# Patient Record
Sex: Female | Born: 1940 | Race: White | Hispanic: No
Health system: Southern US, Community
[De-identification: ages and names within clinical notes are randomized; demographics above are authoritative.]

## PROBLEM LIST (undated history)

## (undated) DIAGNOSIS — J449 Chronic obstructive pulmonary disease, unspecified: Secondary | ICD-10-CM

## (undated) DIAGNOSIS — I10 Essential (primary) hypertension: Secondary | ICD-10-CM

## (undated) DIAGNOSIS — E119 Type 2 diabetes mellitus without complications: Secondary | ICD-10-CM

## (undated) DIAGNOSIS — M1990 Unspecified osteoarthritis, unspecified site: Secondary | ICD-10-CM

---

## 2017-11-18 HISTORY — PX: CATARACT EXTRACTION: SUR2

## 2017-12-02 ENCOUNTER — Emergency Department (HOSPITAL_COMMUNITY): Payer: Medicare (Managed Care)

## 2017-12-02 ENCOUNTER — Inpatient Hospital Stay (HOSPITAL_COMMUNITY)
Admission: EM | Admit: 2017-12-02 | Discharge: 2017-12-06 | DRG: 190 | Disposition: A | Discharge status: Home / Self Care | Payer: Medicare (Managed Care) | Attending: Internal Medicine | Admitting: Internal Medicine

## 2017-12-02 ENCOUNTER — Encounter (HOSPITAL_COMMUNITY): Payer: Self-pay

## 2017-12-02 ENCOUNTER — Other Ambulatory Visit: Payer: Self-pay

## 2017-12-02 DIAGNOSIS — Z7722 Contact with and (suspected) exposure to environmental tobacco smoke (acute) (chronic): Secondary | ICD-10-CM | POA: Diagnosis present

## 2017-12-02 DIAGNOSIS — J441 Chronic obstructive pulmonary disease with (acute) exacerbation: Secondary | ICD-10-CM | POA: Diagnosis not present

## 2017-12-02 DIAGNOSIS — R0902 Hypoxemia: Secondary | ICD-10-CM | POA: Diagnosis not present

## 2017-12-02 DIAGNOSIS — Z833 Family history of diabetes mellitus: Secondary | ICD-10-CM

## 2017-12-02 DIAGNOSIS — Z794 Long term (current) use of insulin: Secondary | ICD-10-CM

## 2017-12-02 DIAGNOSIS — L8990 Pressure ulcer of unspecified site, unspecified stage: Secondary | ICD-10-CM | POA: Diagnosis present

## 2017-12-02 DIAGNOSIS — Z8249 Family history of ischemic heart disease and other diseases of the circulatory system: Secondary | ICD-10-CM

## 2017-12-02 DIAGNOSIS — R000 Tachycardia, unspecified: Secondary | ICD-10-CM | POA: Diagnosis present

## 2017-12-02 DIAGNOSIS — B342 Coronavirus infection, unspecified: Secondary | ICD-10-CM | POA: Diagnosis present

## 2017-12-02 DIAGNOSIS — J9601 Acute respiratory failure with hypoxia: Secondary | ICD-10-CM | POA: Diagnosis present

## 2017-12-02 DIAGNOSIS — N179 Acute kidney failure, unspecified: Secondary | ICD-10-CM | POA: Diagnosis present

## 2017-12-02 DIAGNOSIS — E876 Hypokalemia: Secondary | ICD-10-CM | POA: Diagnosis present

## 2017-12-02 DIAGNOSIS — I10 Essential (primary) hypertension: Secondary | ICD-10-CM | POA: Diagnosis present

## 2017-12-02 DIAGNOSIS — E1110 Type 2 diabetes mellitus with ketoacidosis without coma: Secondary | ICD-10-CM | POA: Diagnosis present

## 2017-12-02 DIAGNOSIS — R319 Hematuria, unspecified: Secondary | ICD-10-CM | POA: Diagnosis present

## 2017-12-02 DIAGNOSIS — E860 Dehydration: Secondary | ICD-10-CM | POA: Diagnosis present

## 2017-12-02 DIAGNOSIS — I4581 Long QT syndrome: Secondary | ICD-10-CM | POA: Diagnosis present

## 2017-12-02 DIAGNOSIS — Z888 Allergy status to other drugs, medicaments and biological substances status: Secondary | ICD-10-CM

## 2017-12-02 DIAGNOSIS — Z79899 Other long term (current) drug therapy: Secondary | ICD-10-CM

## 2017-12-02 DIAGNOSIS — L988 Other specified disorders of the skin and subcutaneous tissue: Secondary | ICD-10-CM | POA: Diagnosis present

## 2017-12-02 DIAGNOSIS — J101 Influenza due to other identified influenza virus with other respiratory manifestations: Secondary | ICD-10-CM | POA: Diagnosis present

## 2017-12-02 DIAGNOSIS — Z803 Family history of malignant neoplasm of breast: Secondary | ICD-10-CM

## 2017-12-02 DIAGNOSIS — M1990 Unspecified osteoarthritis, unspecified site: Secondary | ICD-10-CM | POA: Diagnosis present

## 2017-12-02 HISTORY — DX: Unspecified osteoarthritis, unspecified site: M19.90

## 2017-12-02 HISTORY — DX: Essential (primary) hypertension: I10

## 2017-12-02 HISTORY — DX: Chronic obstructive pulmonary disease, unspecified: J44.9

## 2017-12-02 HISTORY — DX: Type 2 diabetes mellitus without complications: E11.9

## 2017-12-02 LAB — COMPREHENSIVE METABOLIC PANEL
ALK PHOS: 59 U/L (ref 38–126)
ALT: 15 U/L (ref 14–54)
AST: 22 U/L (ref 15–41)
Albumin: 3.5 g/dL (ref 3.5–5.0)
Anion gap: 16 — ABNORMAL HIGH (ref 5–15)
BILIRUBIN TOTAL: 0.7 mg/dL (ref 0.3–1.2)
BUN: 9 mg/dL (ref 6–20)
CHLORIDE: 96 mmol/L — AB (ref 101–111)
CO2: 23 mmol/L (ref 22–32)
CREATININE: 1.13 mg/dL — AB (ref 0.44–1.00)
Calcium: 8.8 mg/dL — ABNORMAL LOW (ref 8.9–10.3)
GFR, EST AFRICAN AMERICAN: 54 mL/min — AB (ref >60)
GFR, EST NON AFRICAN AMERICAN: 47 mL/min — AB (ref >60)
Glucose, Bld: 410 mg/dL — ABNORMAL HIGH (ref 65–99)
Potassium: 3 mmol/L — ABNORMAL LOW (ref 3.5–5.1)
Sodium: 135 mmol/L (ref 135–145)
Total Protein: 6.9 g/dL (ref 6.5–8.1)

## 2017-12-02 LAB — CBC WITH DIFFERENTIAL/PLATELET
Basophils Absolute: 0.1 10*3/uL (ref 0.0–0.1)
Basophils Relative: 1 %
EOS ABS: 0.2 10*3/uL (ref 0.0–0.7)
Eosinophils Relative: 3 %
HEMATOCRIT: 39.1 % (ref 36.0–46.0)
HEMOGLOBIN: 13.2 g/dL (ref 12.0–15.0)
LYMPHS ABS: 3.1 10*3/uL (ref 0.7–4.0)
LYMPHS PCT: 47 %
MCH: 29.6 pg (ref 26.0–34.0)
MCHC: 33.8 g/dL (ref 30.0–36.0)
MCV: 87.7 fL (ref 78.0–100.0)
MONOS PCT: 6 %
Monocytes Absolute: 0.4 10*3/uL (ref 0.1–1.0)
NEUTROS ABS: 2.8 10*3/uL (ref 1.7–7.7)
NEUTROS PCT: 43 %
Platelets: 156 10*3/uL (ref 150–400)
RBC: 4.46 MIL/uL (ref 3.87–5.11)
RDW: 13.3 % (ref 11.5–15.5)
WBC: 6.7 10*3/uL (ref 4.0–10.5)

## 2017-12-02 LAB — URINALYSIS, COMPLETE (UACMP) WITH MICROSCOPIC
Bilirubin Urine: NEGATIVE
Glucose, UA: >=500 mg/dL — AB
Hgb urine dipstick: NEGATIVE
Ketones, ur: 5 mg/dL — AB
Leukocytes, UA: NEGATIVE
Nitrite: NEGATIVE
Specific Gravity, Urine: 1.031 — ABNORMAL HIGH (ref 1.005–1.030)
pH: 5 (ref 5.0–8.0)

## 2017-12-02 LAB — BASIC METABOLIC PANEL
ANION GAP: 12 (ref 5–15)
BUN: 16 mg/dL (ref 6–20)
CO2: 23 mmol/L (ref 22–32)
Calcium: 8.4 mg/dL — ABNORMAL LOW (ref 8.9–10.3)
Chloride: 95 mmol/L — ABNORMAL LOW (ref 101–111)
Creatinine, Ser: 1.58 mg/dL — ABNORMAL HIGH (ref 0.44–1.00)
GFR calc Af Amer: 36 mL/min — ABNORMAL LOW (ref >60)
GFR, EST NON AFRICAN AMERICAN: 31 mL/min — AB (ref >60)
GLUCOSE: 746 mg/dL — AB (ref 65–99)
POTASSIUM: 4.7 mmol/L (ref 3.5–5.1)
Sodium: 130 mmol/L — ABNORMAL LOW (ref 135–145)

## 2017-12-02 LAB — TROPONIN I

## 2017-12-02 LAB — CBG MONITORING, ED: Glucose-Capillary: >600 mg/dL (ref 65–99)

## 2017-12-02 LAB — GLUCOSE, CAPILLARY

## 2017-12-02 LAB — BRAIN NATRIURETIC PEPTIDE: B Natriuretic Peptide: 30 pg/mL (ref 0.0–100.0)

## 2017-12-02 MED ORDER — ENOXAPARIN SODIUM 40 MG/0.4ML ~~LOC~~ SOLN
40.0000 mg | SUBCUTANEOUS | Status: DC
  Administered 2017-12-02 – 2017-12-05 (×4): 40 mg via SUBCUTANEOUS
  Filled 2017-12-02 (×4): qty 0.4

## 2017-12-02 MED ORDER — POLYMYXIN B-TRIMETHOPRIM 10000-0.1 UNIT/ML-% OP SOLN
1.0000 [drp] | Freq: Four times a day (QID) | OPHTHALMIC | Status: DC
  Administered 2017-12-02: 1 [drp] via OPHTHALMIC
  Filled 2017-12-02: qty 10

## 2017-12-02 MED ORDER — PREDNISOLONE ACETATE 1 % OP SUSP
1.0000 [drp] | Freq: Four times a day (QID) | OPHTHALMIC | Status: DC
  Administered 2017-12-02: 1 [drp] via OPHTHALMIC
  Filled 2017-12-02: qty 1

## 2017-12-02 MED ORDER — OSELTAMIVIR PHOSPHATE 75 MG PO CAPS: 75.0000 mg | ORAL_CAPSULE | Freq: Two times a day (BID) | ORAL | Status: DC

## 2017-12-02 MED ORDER — TRAZODONE HCL 50 MG PO TABS
50.0000 mg | ORAL_TABLET | Freq: Every evening | ORAL | Status: DC | PRN
  Administered 2017-12-03: 50 mg via ORAL
  Filled 2017-12-02: qty 1

## 2017-12-02 MED ORDER — INSULIN ASPART 100 UNIT/ML ~~LOC~~ SOLN
0.0000 [IU] | Freq: Every day | SUBCUTANEOUS | Status: DC
  Administered 2017-12-03 – 2017-12-05 (×3): 4 [IU] via SUBCUTANEOUS

## 2017-12-02 MED ORDER — SODIUM CHLORIDE 0.9 % IV SOLN
INTRAVENOUS | Status: DC
  Filled 2017-12-02 (×2): qty 1000

## 2017-12-02 MED ORDER — PREDNISONE 20 MG PO TABS
40.0000 mg | ORAL_TABLET | Freq: Every day | ORAL | Status: DC
  Administered 2017-12-03 – 2017-12-06 (×4): 40 mg via ORAL
  Filled 2017-12-02 (×4): qty 2

## 2017-12-02 MED ORDER — INSULIN ASPART 100 UNIT/ML ~~LOC~~ SOLN
10.0000 [IU] | Freq: Once | SUBCUTANEOUS | Status: AC
  Administered 2017-12-02: 10 [IU] via SUBCUTANEOUS

## 2017-12-02 MED ORDER — ACETAMINOPHEN 325 MG PO TABS
650.0000 mg | ORAL_TABLET | Freq: Four times a day (QID) | ORAL | Status: DC | PRN
  Administered 2017-12-03: 650 mg via ORAL
  Filled 2017-12-02: qty 2

## 2017-12-02 MED ORDER — INSULIN ASPART 100 UNIT/ML ~~LOC~~ SOLN: 0.0000 [IU] | Freq: Every day | SUBCUTANEOUS | Status: DC

## 2017-12-02 MED ORDER — MELATONIN 10 MG PO TABS: 10.0000 mg | ORAL_TABLET | Freq: Every day | ORAL | Status: DC

## 2017-12-02 MED ORDER — ACETAMINOPHEN 650 MG RE SUPP: 650.0000 mg | Freq: Four times a day (QID) | RECTAL | Status: DC | PRN

## 2017-12-02 MED ORDER — ONDANSETRON HCL 4 MG/2ML IJ SOLN: 4.0000 mg | Freq: Four times a day (QID) | INTRAMUSCULAR | Status: DC | PRN

## 2017-12-02 MED ORDER — SODIUM CHLORIDE 0.9 % IV BOLUS (SEPSIS)
1000.0000 mL | Freq: Once | INTRAVENOUS | Status: AC
  Administered 2017-12-02: 1000 mL via INTRAVENOUS

## 2017-12-02 MED ORDER — INSULIN NPH (HUMAN) (ISOPHANE) 100 UNIT/ML ~~LOC~~ SUSP
20.0000 [IU] | Freq: Two times a day (BID) | SUBCUTANEOUS | Status: DC
  Administered 2017-12-02 – 2017-12-03 (×2): 20 [IU] via SUBCUTANEOUS
  Filled 2017-12-02 (×2): qty 10

## 2017-12-02 MED ORDER — SODIUM CHLORIDE 0.9 % IV BOLUS (SEPSIS): 1000.0000 mL | Freq: Once | INTRAVENOUS | Status: DC

## 2017-12-02 MED ORDER — POTASSIUM CHLORIDE 10 MEQ/100ML IV SOLN
10.0000 meq | INTRAVENOUS | Status: DC
  Administered 2017-12-02: 10 meq via INTRAVENOUS
  Filled 2017-12-02: qty 100

## 2017-12-02 MED ORDER — IPRATROPIUM-ALBUTEROL 0.5-2.5 (3) MG/3ML IN SOLN
3.0000 mL | Freq: Four times a day (QID) | RESPIRATORY_TRACT | Status: DC | PRN
  Administered 2017-12-03 (×2): 3 mL via RESPIRATORY_TRACT
  Filled 2017-12-02 (×2): qty 3

## 2017-12-02 MED ORDER — POTASSIUM CHLORIDE CRYS ER 20 MEQ PO TBCR: 40.0000 meq | EXTENDED_RELEASE_TABLET | Freq: Two times a day (BID) | ORAL | Status: DC

## 2017-12-02 MED ORDER — INSULIN ASPART 100 UNIT/ML ~~LOC~~ SOLN: 0.0000 [IU] | Freq: Three times a day (TID) | SUBCUTANEOUS | Status: DC

## 2017-12-02 MED ORDER — GABAPENTIN 600 MG PO TABS
600.0000 mg | ORAL_TABLET | Freq: Three times a day (TID) | ORAL | Status: DC
  Administered 2017-12-02 – 2017-12-06 (×12): 600 mg via ORAL
  Filled 2017-12-02 (×12): qty 1

## 2017-12-02 MED ORDER — SODIUM CHLORIDE 0.9 % IV SOLN
500.0000 mg | Freq: Once | INTRAVENOUS | Status: AC
  Administered 2017-12-02: 500 mg via INTRAVENOUS
  Filled 2017-12-02: qty 500

## 2017-12-02 MED ORDER — SODIUM CHLORIDE 0.9 % IV BOLUS (SEPSIS): 500.0000 mL | Freq: Once | INTRAVENOUS | Status: DC

## 2017-12-02 MED ORDER — ONDANSETRON HCL 4 MG PO TABS
4.0000 mg | ORAL_TABLET | Freq: Four times a day (QID) | ORAL | Status: DC | PRN
  Administered 2017-12-06: 4 mg via ORAL
  Filled 2017-12-02: qty 1

## 2017-12-02 MED ORDER — OSELTAMIVIR PHOSPHATE 30 MG PO CAPS
30.0000 mg | ORAL_CAPSULE | Freq: Two times a day (BID) | ORAL | Status: DC
  Administered 2017-12-02 – 2017-12-06 (×8): 30 mg via ORAL
  Filled 2017-12-02 (×10): qty 1

## 2017-12-02 MED ORDER — SENNOSIDES-DOCUSATE SODIUM 8.6-50 MG PO TABS
1.0000 | ORAL_TABLET | Freq: Every evening | ORAL | Status: DC | PRN
  Administered 2017-12-03: 1 via ORAL
  Filled 2017-12-02: qty 1

## 2017-12-02 MED ORDER — ROSUVASTATIN CALCIUM 10 MG PO TABS
20.0000 mg | ORAL_TABLET | Freq: Every day | ORAL | Status: DC
  Administered 2017-12-02 – 2017-12-05 (×4): 20 mg via ORAL
  Filled 2017-12-02 (×4): qty 2

## 2017-12-02 MED ORDER — DULOXETINE HCL 60 MG PO CPEP
60.0000 mg | ORAL_CAPSULE | Freq: Every day | ORAL | Status: DC
  Administered 2017-12-03 – 2017-12-06 (×4): 60 mg via ORAL
  Filled 2017-12-02 (×4): qty 1

## 2017-12-02 MED ORDER — MELATONIN 3 MG PO TABS
9.0000 mg | ORAL_TABLET | Freq: Every day | ORAL | Status: DC
  Administered 2017-12-02 – 2017-12-05 (×4): 9 mg via ORAL
  Filled 2017-12-02 (×5): qty 3

## 2017-12-02 MED ORDER — KETOROLAC TROMETHAMINE 0.5 % OP SOLN
1.0000 [drp] | Freq: Four times a day (QID) | OPHTHALMIC | Status: DC
  Administered 2017-12-02: 1 [drp] via OPHTHALMIC
  Filled 2017-12-02: qty 3

## 2017-12-02 MED ORDER — INSULIN ASPART 100 UNIT/ML ~~LOC~~ SOLN
0.0000 [IU] | Freq: Three times a day (TID) | SUBCUTANEOUS | Status: DC
  Administered 2017-12-03: 5 [IU] via SUBCUTANEOUS
  Administered 2017-12-03 – 2017-12-04 (×3): 11 [IU] via SUBCUTANEOUS
  Administered 2017-12-04: 3 [IU] via SUBCUTANEOUS
  Administered 2017-12-04: 8 [IU] via SUBCUTANEOUS
  Administered 2017-12-05: 11 [IU] via SUBCUTANEOUS
  Administered 2017-12-05: 5 [IU] via SUBCUTANEOUS
  Administered 2017-12-05: 11 [IU] via SUBCUTANEOUS
  Administered 2017-12-06: 2 [IU] via SUBCUTANEOUS
  Administered 2017-12-06: 11 [IU] via SUBCUTANEOUS
  Administered 2017-12-06: 15 [IU] via SUBCUTANEOUS

## 2017-12-02 MED ORDER — SODIUM CHLORIDE 0.9 % IV SOLN: INTRAVENOUS | Status: DC

## 2017-12-02 MED ORDER — IPRATROPIUM-ALBUTEROL 0.5-2.5 (3) MG/3ML IN SOLN
3.0000 mL | Freq: Once | RESPIRATORY_TRACT | Status: AC
  Administered 2017-12-02: 3 mL via RESPIRATORY_TRACT
  Filled 2017-12-02: qty 3

## 2017-12-02 NOTE — ED Notes (Signed)
Pharmacist called and advised he would send the Novolin to the floor because they have to charge the patient for the entire bottle.

## 2017-12-02 NOTE — ED Notes (Signed)
Contacted admitting doc in regards to elevated sugar. Dr. Mikey BussingHoffman advised to give 20units of Novolin and then give sliding scale insulin as indicated.

## 2017-12-02 NOTE — Plan of Care (Signed)
Progressing
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
## 2017-12-02 NOTE — ED Triage Notes (Signed)
[facility] and was dx. With COPD and Flu today. Pt reports she developed SOB and cough last night with green sputum. Also reports that [place] states that she has not had home O2 since. Pt O2 at 86% RA. A&O.
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
## 2017-12-03 ENCOUNTER — Encounter (HOSPITAL_COMMUNITY): Payer: Self-pay

## 2017-12-03 DIAGNOSIS — J101 Influenza due to other identified influenza virus with other respiratory manifestations: Secondary | ICD-10-CM

## 2017-12-03 DIAGNOSIS — E876 Hypokalemia: Secondary | ICD-10-CM | POA: Diagnosis not present

## 2017-12-03 DIAGNOSIS — I10 Essential (primary) hypertension: Secondary | ICD-10-CM

## 2017-12-03 DIAGNOSIS — E1110 Type 2 diabetes mellitus with ketoacidosis without coma: Secondary | ICD-10-CM | POA: Diagnosis present

## 2017-12-03 DIAGNOSIS — Z794 Long term (current) use of insulin: Secondary | ICD-10-CM | POA: Diagnosis not present

## 2017-12-03 DIAGNOSIS — J9601 Acute respiratory failure with hypoxia: Secondary | ICD-10-CM

## 2017-12-03 DIAGNOSIS — B9729 Other coronavirus as the cause of diseases classified elsewhere: Secondary | ICD-10-CM

## 2017-12-03 DIAGNOSIS — M1990 Unspecified osteoarthritis, unspecified site: Secondary | ICD-10-CM | POA: Diagnosis not present

## 2017-12-03 DIAGNOSIS — Z79899 Other long term (current) drug therapy: Secondary | ICD-10-CM

## 2017-12-03 DIAGNOSIS — J441 Chronic obstructive pulmonary disease with (acute) exacerbation: Principal | ICD-10-CM

## 2017-12-03 LAB — RESPIRATORY PANEL BY PCR
ADENOVIRUS-RVPPCR: NOT DETECTED
BORDETELLA PERTUSSIS-RVPCR: NOT DETECTED
CHLAMYDOPHILA PNEUMONIAE-RVPPCR: NOT DETECTED
CORONAVIRUS 229E-RVPPCR: NOT DETECTED
CORONAVIRUS HKU1-RVPPCR: NOT DETECTED
Coronavirus NL63: NOT DETECTED
Coronavirus OC43: DETECTED — AB
Influenza A: NOT DETECTED
Influenza B: NOT DETECTED
METAPNEUMOVIRUS-RVPPCR: NOT DETECTED
Mycoplasma pneumoniae: NOT DETECTED
PARAINFLUENZA VIRUS 2-RVPPCR: NOT DETECTED
Parainfluenza Virus 1: NOT DETECTED
Parainfluenza Virus 3: NOT DETECTED
Parainfluenza Virus 4: NOT DETECTED
Respiratory Syncytial Virus: NOT DETECTED
Rhinovirus / Enterovirus: NOT DETECTED

## 2017-12-03 LAB — BASIC METABOLIC PANEL
Anion gap: 9 (ref 5–15)
BUN: 17 mg/dL (ref 6–20)
CALCIUM: 8.1 mg/dL — AB (ref 8.9–10.3)
CO2: 24 mmol/L (ref 22–32)
CREATININE: 1.07 mg/dL — AB (ref 0.44–1.00)
Chloride: 101 mmol/L (ref 101–111)
GFR calc Af Amer: 58 mL/min — ABNORMAL LOW (ref >60)
GFR calc non Af Amer: 50 mL/min — ABNORMAL LOW (ref >60)
GLUCOSE: 380 mg/dL — AB (ref 65–99)
Potassium: 3.5 mmol/L (ref 3.5–5.1)
Sodium: 134 mmol/L — ABNORMAL LOW (ref 135–145)

## 2017-12-03 LAB — COMPREHENSIVE METABOLIC PANEL
ALK PHOS: 55 U/L (ref 38–126)
ALT: 15 U/L (ref 14–54)
AST: 16 U/L (ref 15–41)
Albumin: 3 g/dL — ABNORMAL LOW (ref 3.5–5.0)
Anion gap: 10 (ref 5–15)
BILIRUBIN TOTAL: 0.4 mg/dL (ref 0.3–1.2)
BUN: 19 mg/dL (ref 6–20)
CALCIUM: 8.3 mg/dL — AB (ref 8.9–10.3)
CHLORIDE: 98 mmol/L — AB (ref 101–111)
CO2: 23 mmol/L (ref 22–32)
CREATININE: 1.34 mg/dL — AB (ref 0.44–1.00)
GFR calc Af Amer: 44 mL/min — ABNORMAL LOW (ref >60)
GFR, EST NON AFRICAN AMERICAN: 38 mL/min — AB (ref >60)
Glucose, Bld: 572 mg/dL (ref 65–99)
Potassium: 3.9 mmol/L (ref 3.5–5.1)
Sodium: 131 mmol/L — ABNORMAL LOW (ref 135–145)
TOTAL PROTEIN: 6.3 g/dL — AB (ref 6.5–8.1)

## 2017-12-03 LAB — CBC
HCT: 37.1 % (ref 36.0–46.0)
Hemoglobin: 12.5 g/dL (ref 12.0–15.0)
MCH: 29.3 pg (ref 26.0–34.0)
MCHC: 33.7 g/dL (ref 30.0–36.0)
MCV: 87.1 fL (ref 78.0–100.0)
PLATELETS: 148 10*3/uL — AB (ref 150–400)
RBC: 4.26 MIL/uL (ref 3.87–5.11)
RDW: 12.8 % (ref 11.5–15.5)
WBC: 4.6 10*3/uL (ref 4.0–10.5)

## 2017-12-03 LAB — GLUCOSE, CAPILLARY
Glucose-Capillary: 336 mg/dL — ABNORMAL HIGH (ref 65–99)
Glucose-Capillary: 284 mg/dL — ABNORMAL HIGH (ref 65–99)
Glucose-Capillary: 264 mg/dL — ABNORMAL HIGH (ref 65–99)
Glucose-Capillary: 336 mg/dL — ABNORMAL HIGH (ref 65–99)
Glucose-Capillary: 336 mg/dL — ABNORMAL HIGH (ref 65–99)

## 2017-12-03 MED ORDER — POLYMYXIN B-TRIMETHOPRIM 10000-0.1 UNIT/ML-% OP SOLN
1.0000 [drp] | Freq: Four times a day (QID) | OPHTHALMIC | Status: DC
  Administered 2017-12-03 – 2017-12-06 (×14): 1 [drp] via OPHTHALMIC
  Filled 2017-12-03: qty 10

## 2017-12-03 MED ORDER — INSULIN GLARGINE 100 UNIT/ML ~~LOC~~ SOLN
20.0000 [IU] | Freq: Every day | SUBCUTANEOUS | Status: DC
  Administered 2017-12-03 – 2017-12-04 (×2): 20 [IU] via SUBCUTANEOUS
  Filled 2017-12-03 (×2): qty 0.2

## 2017-12-03 MED ORDER — PREDNISOLONE ACETATE 1 % OP SUSP
1.0000 [drp] | Freq: Four times a day (QID) | OPHTHALMIC | Status: DC
  Administered 2017-12-03 – 2017-12-06 (×14): 1 [drp] via OPHTHALMIC
  Filled 2017-12-03: qty 1

## 2017-12-03 MED ORDER — INSULIN ASPART 100 UNIT/ML ~~LOC~~ SOLN
10.0000 [IU] | Freq: Once | SUBCUTANEOUS | Status: AC
  Administered 2017-12-03: 10 [IU] via SUBCUTANEOUS

## 2017-12-03 MED ORDER — SODIUM CHLORIDE 0.9 % IV BOLUS (SEPSIS)
1000.0000 mL | Freq: Once | INTRAVENOUS | Status: AC
  Administered 2017-12-03: 1000 mL via INTRAVENOUS

## 2017-12-03 MED ORDER — POTASSIUM CHLORIDE CRYS ER 20 MEQ PO TBCR
30.0000 meq | EXTENDED_RELEASE_TABLET | Freq: Once | ORAL | Status: AC
  Administered 2017-12-03: 30 meq via ORAL
  Filled 2017-12-03: qty 1

## 2017-12-03 MED ORDER — IPRATROPIUM-ALBUTEROL 0.5-2.5 (3) MG/3ML IN SOLN
3.0000 mL | Freq: Three times a day (TID) | RESPIRATORY_TRACT | Status: DC
  Administered 2017-12-03: 3 mL via RESPIRATORY_TRACT
  Filled 2017-12-03 (×2): qty 3

## 2017-12-03 MED ORDER — SODIUM CHLORIDE 0.9 % IV SOLN
INTRAVENOUS | Status: AC
  Administered 2017-12-03 (×2): via INTRAVENOUS

## 2017-12-03 MED ORDER — IPRATROPIUM-ALBUTEROL 0.5-2.5 (3) MG/3ML IN SOLN
3.0000 mL | RESPIRATORY_TRACT | Status: DC | PRN
  Administered 2017-12-03 – 2017-12-04 (×2): 3 mL via RESPIRATORY_TRACT
  Filled 2017-12-03: qty 3

## 2017-12-03 MED ORDER — KETOROLAC TROMETHAMINE 0.5 % OP SOLN
1.0000 [drp] | Freq: Four times a day (QID) | OPHTHALMIC | Status: DC
  Administered 2017-12-03 – 2017-12-06 (×14): 1 [drp] via OPHTHALMIC
  Filled 2017-12-03 (×2): qty 3

## 2017-12-03 MED ORDER — ACETAMINOPHEN 325 MG PO TABS
650.0000 mg | ORAL_TABLET | Freq: Three times a day (TID) | ORAL | Status: DC
  Administered 2017-12-03 – 2017-12-06 (×9): 650 mg via ORAL
  Filled 2017-12-03 (×9): qty 2

## 2017-12-03 MED ORDER — IPRATROPIUM-ALBUTEROL 0.5-2.5 (3) MG/3ML IN SOLN
3.0000 mL | Freq: Four times a day (QID) | RESPIRATORY_TRACT | Status: DC
  Administered 2017-12-04 (×2): 3 mL via RESPIRATORY_TRACT
  Filled 2017-12-03 (×2): qty 3

## 2017-12-03 MED ORDER — GUAIFENESIN-DM 100-10 MG/5ML PO SYRP
5.0000 mL | ORAL_SOLUTION | ORAL | Status: DC | PRN
  Administered 2017-12-03: 5 mL via ORAL
  Filled 2017-12-03: qty 5

## 2017-12-03 NOTE — Evaluation (Signed)
Physical Therapy Evaluation Patient Details Name: Gabriella [name] MRN: [id number] DOB: 12-15-1940 Today's Date: 12/03/2017   History of Present Illness  Patient [facility] with dyspnea. At baseline she was not using O2. She is on 2L today. PMH: HTN, CHF, Arthritis; DMII   Clinical Impression  Patient presents with decreased endurance compared to baseline. Her Sao2 dropped to 90% with 6 feet of ambulation. She became short of breath with limited distance. At this time she would benefit from a reurn to the pace program but may need to improve her endurance and mobility. Skilled therapy will continue to work with the patient.     Follow Up Recommendations SNF;Other (comment)(Back to [facility] V SNF)    Equipment Recommendations       Recommendations for Other Services       Precautions / Restrictions Precautions Precautions: Fall Restrictions Weight Bearing Restrictions: No      Mobility  Bed Mobility Overal bed mobility: Needs Assistance Bed Mobility: Supine to Sit;Sit to Supine     Supine to sit: Min assist     General bed mobility comments: reequired assit to the edge of the bed. Once to the edge of the bed able to maintain sitting psoition Sao2 95%   Transfers Overall transfer level: Needs assistance Equipment used: Rolling walker (2 wheeled) Transfers: Sit to/from Stand Sit to Stand: Min guard         General transfer comment: guarding for balance Sao2 94% on 2l   Ambulation/Gait Ambulation/Gait assistance: Min guard Ambulation Distance (Feet): 6 Feet Assistive device: Rolling walker (2 wheeled) Gait Pattern/deviations: Decreased stride length     General Gait Details: Patient became syncopal after 3 steps. Sao2 down to 90 %. Improved syncope with 30 sec seated rest break. patient on 2L of Sao2  Stairs            Wheelchair Mobility    Modified Rankin (Stroke Patients Only)       Balance Overall balance  assessment: Needs assistance Sitting-balance support: No upper extremity supported Sitting balance-Leahy Scale: Good     Standing balance support: Bilateral upper extremity supported Standing balance-Leahy Scale: Poor                               Pertinent Vitals/Pain      Home Living Family/patient expects to be discharged to:: Private residence Living Arrangements: Children Available Help at Discharge: Other (Comment)(goes to [facility] progr[facility] during the day )                  Prior Function Level of Independence: Independent with assistive device(s)         Comments: used rollator      Hand Dominance   Dominant Hand: Right    Extremity/Trunk Assessment   Upper Extremity Assessment Upper Extremity Assessment: Overall WFL for tasks assessed    Lower Extremity Assessment Lower Extremity Assessment: Overall WFL for tasks assessed       Communication   Communication: No difficulties  Cognition Arousal/Alertness: Awake/alert Behavior During Therapy: WFL for tasks assessed/performed Overall Cognitive Status: Within Functional Limits for tasks assessed                                        General Comments General comments (skin integrity, edema, etc.): 2L of  NCo2     Exercises     Assessment/Plan    PT Assessment Patient needs continued PT services  PT Problem List Decreased strength;Decreased activity tolerance;Decreased mobility;Decreased coordination       PT Treatment Interventions Gait training;Stair training;Therapeutic activities;Functional mobility training;Therapeutic exercise;Patient/family education;DME instruction    PT Goals (Current goals can be found in the Care Plan section)  Acute Rehab PT Goals Patient Stated Goal: to breath better with activity  PT Goal Formulation: With patient Time For Goal Achievement: 12/10/17 Potential to Achieve Goals: Good    Frequency Min 4X/week   Barriers to discharge         Co-evaluation               AM-PAC PT "6 Clicks" Daily Activity  Outcome Measure Difficulty turning over in bed (including adjusting bedclothes, sheets and blankets)?: A Little Difficulty moving from lying on back to sitting on the side of the bed? : A Little Difficulty sitting down on and standing up from a chair with arms (e.g., wheelchair, bedside commode, etc,.)?: A Little Help needed moving to and from a bed to chair (including a wheelchair)?: A Little Help needed walking in hospital room?: A Lot Help needed climbing 3-5 steps with a railing? : A Lot 6 Click Score: 16    End of Session Equipment Utilized During Treatment: Gait belt Activity Tolerance: Patient limited by fatigue Patient left: in bed;with bed alarm set;with nursing/sitter in room   PT Visit Diagnosis: Unsteadiness on feet (R26.81);Other abnormalities of gait and mobility (R26.89);Repeated falls (R29.6);Muscle weakness (generalized) (M62.81)    Time: 0981-1914 PT Time Calculation (min) (ACUTE ONLY): 23 min   Charges:   PT Evaluation $PT Eval Moderate Complexity: 1 Mod     PT G Codes:          Dessie Coma PT DPT  12/03/2017, 3:11 PM

## 2017-12-03 NOTE — Progress Notes (Signed)
Critical glucose (746). Dr. Mikey BussingHoffman at bedside and notified. Labs were drawn prior to administration of insulin and bolus. Pt without distress. Will monitor.

## 2017-12-03 NOTE — Progress Notes (Addendum)
Patient was admitted today for COPD exacerbation and received solumedrol prior to arrival to the hospital.  Blood sugars were 410 on admission.  Patient received novolin 20 units (home dose of 25 units) with no improvement in CBGs. Went to evaluate patient for concerning Blood sugars >600.  Patient was alert and oriented x 3 and states she was breathing comfortably.  She was satting 90% on 3L .  A stat BMET showed glucose: 746, bicarb 23, potassium 4.7, normal anion gap.  1L bolus was started and 10 units of novolog given.  No history of CHF.  If cbgs still elevated will need to continue with fluid bolus and administer insulin as needed.  Not concerned for DKA at this time.  Will need close cbg monitoring.   Addendum 3:45am Bmet with glucose of 570s, normal anion gap, K of 3.9 and bicarb of 23.  Will bolus 1L NS over 2 hours and give 10 units of novolog.  She will remain on maintenance fluids after the bolus.

## 2017-12-03 NOTE — Progress Notes (Signed)
Gabriella [name] 77 y o f patient received from ED. Patient is alert and oriented x 3. Vital signs are stable. Skin assessment done with another nurse. Patient given instruction about call bell, phone and unit routine. Bed in low position, side rail up x2 and call bell in reach.

## 2017-12-03 NOTE — Progress Notes (Signed)
Date: 12/03/2017  Patient name: Gabriella [name]  Medical record number: [id number]  Date of birth: 01-Jul-1941   I have seen and evaluated Gabriella [name] and discussed their care with the Residency Team.  In brief, patient is a [age] year old female with a past medical history of COPD, insulin-dependent type 2 diabetes, hypertension and arthritis who presented to the ED with worsening shortness of breath over the last day.  Patient states that she had progressive shortness of breath at home associated with nausea and the day prior to admission and went to [facility] her [facility] care [facility] and test positive for the flu and was sent to the emergency room after receiving a dose of Solu-Medrol.  Patient complained of associated rhinorrhea and productive cough with clear phlegm as well as associated wheezing.  Patient also complained of associated chest pain which worsened with cough and is sharp in nature.  No nausea/vomiting, no abdominal pain, no diarrhea, no lightheadedness, no syncope, no focal weakness, tingling or numbness, no palpitations, no diaphoresis.  Today patient states that she has episodes of intermittent cough as well as associated pleuritic type chest pain on coughing.  PMHx, Fam Hx, and/or Soc Hx : As per resident admit note  Vitals:   12/03/17 0754 12/03/17 0758  BP:    Pulse:    Resp:    Temp:  98.6 F (37 C)  SpO2: 94%    General: Awake alert and oriented x3, NAD CVS: Regular rate and rhythm, normal heart sounds Lungs: Scattered bilateral expiratory wheeze noted Abdomen: Soft, nontender, nondistended, normoactive bowel sounds Extremities: No edema noted Neuro: No focal deficits noted, awake alert and oriented x3, cranial nerves II to XII grossly intact HEENT: Normocephalic, atraumatic, no conjunctival injection noted   Assessment and Plan: I have seen and evaluated the patient as outlined above. I agree with the formulated Assessment and Plan as detailed in  the residents' note, with the following changes:   1.  Acute hypoxic respiratory failure: -Patient initially presented with worsening shortness of breath and was found to have be flu positive at her PCPs office and had an oxygen saturation of 85% on room air noted by EMS.  Patient did not have leukocytosis or fevers and her x-ray did not have any evidence of an infiltrate.  It is likely that the patient has an acute COPD exacerbation secondary to an underlying viral illness.  Patient was noted to be flu positive at her PCPs office but on her respiratory viral panel here she tested positive for corona virus and was negative for the flu. -We will complete 5-day course of Tamiflu  -Continue with duo nebs every 6 hours as needed -Maintain respiratory precautions for now -We will continue with O2 via nasal cannula as needed.  Her oxygen saturations were in the mid to late 90s on 3 L nasal cannula this morning.  We have decreased it to 2 L and will monitor -PT/OT eval pending -We will continue with prednisone 40 mg daily for 5-day course for COPD exacerbation -No further workup at this time -Patient will likely be stable for discharge home tomorrow  2.  Mild DKA: -Patient was noted to have blood sugars up to the 700s after receiving Solu-Medrol at her PCPs office -She received NovoLog insulin and was given IV fluids -Patient was noted to have mildly positive ketones on her urine analysis and a mildly elevated anion gap of 16 on admission which quickly resolved -Blood sugars are much improved today  down to the 200s and her anion gap has been within normal limits over the last 3 BMPs -We will resume her home dose of Lantus 20 units today -Patient will need outpatient follow-up with her PCP -No further workup at this time  Earl Lagos, MD 3/16/201911:31 AM

## 2017-12-03 NOTE — Progress Notes (Signed)
   Subjective: [name] was seen laying down in her bed. She states that she feels like her muscle aches, cough, and sob has worsened today. She mentioned that she would like to continue her treatment in hospital for another day.   Objective:  Vital signs in last 24 hours: Vitals:   12/03/17 0422 12/03/17 0740 12/03/17 0754 12/03/17 0758  BP: 139/71     Pulse: 89     Resp: 20     Temp: 98.2 F (36.8 C) 98.3 F (36.8 C)  98.6 F (37 C)  TempSrc: Oral Oral  Oral  SpO2: 98%  94%   Weight:      Height:       Physical Exam  Constitutional: She appears well-developed and well-nourished. No distress.  HENT:  Head: Normocephalic and atraumatic.  Eyes: Conjunctivae are normal.  Cardiovascular: Normal rate, regular rhythm and normal heart sounds.  Respiratory: Effort normal. No respiratory distress. She has wheezes (scattered wheezing).  GI: Soft. Bowel sounds are normal. She exhibits no distension. There is no tenderness.  Musculoskeletal: She exhibits no edema.  Neurological: She is alert. No cranial nerve deficit.  Skin: She is not diaphoretic. No erythema.  Psychiatric: She has a normal mood and affect. Her behavior is normal. Judgment and thought content normal.   Assessment/Plan:  77 y.o female with history of COPD, diabetes mellitus, essential hypertension, and arthritis presents with shortness of breath and blood in adult pamper. Lab tests significant for random glucose>600, negative troponin and bnp. Chest xray without any evidence of pneumonia or effusion. The patient tested positive for influenza which is likely prompting COPD exacerbation.   Acute respiratory failure  COPD exacerbation secondary to viral illness The patient's acute dyspnea with accompanied productive cough, myalgias, and fatigue are likely due to viral infection causing a copd exacerbation. The patient tested rapid flu positive at pcp's office, however on rvp she only tested positive for coronavirus.    -Tamiflu 75mg  bid for 5 days  -Continue 2L via [place] -Duoneb q6hrs prn -Tamiflu 75mg  bid  Hypokalemia Patient's k=3.5 today.   -Repleted 30meq of kdur once  DKA The patient presented with blood glucose upto 700s, mild anion gap, and ketones in urine on admission. She was started on dka protocol by keeping npo and giving fluids throughout the night. Her last recorded blood glucose reading this morning is 264 with anion gap returned to normal.   -Resume home lantus 20units qhs -CBG monitoring 4 times daily -SSI tid wc  -SSI qhs  Essential Hypertension The patient's blood pressure is reasonably well controlled at 139/71 today.  over The patient takes lisinopril 20mg  qd and amlodipine 10mg  qd.   -Continue lisinopril 20mg  qd   Dispo: Anticipated discharge in approximately 1-2 day(s).   Lorenso Courierhundi, Davinder Haff, MD 12/03/2017, 1:53 PM Pager: [phone]

## 2017-12-04 DIAGNOSIS — I10 Essential (primary) hypertension: Secondary | ICD-10-CM | POA: Diagnosis present

## 2017-12-04 DIAGNOSIS — Z9109 Other allergy status, other than to drugs and biological substances: Secondary | ICD-10-CM | POA: Diagnosis not present

## 2017-12-04 DIAGNOSIS — Z8249 Family history of ischemic heart disease and other diseases of the circulatory system: Secondary | ICD-10-CM | POA: Diagnosis not present

## 2017-12-04 DIAGNOSIS — I4581 Long QT syndrome: Secondary | ICD-10-CM | POA: Diagnosis present

## 2017-12-04 DIAGNOSIS — R319 Hematuria, unspecified: Secondary | ICD-10-CM | POA: Diagnosis present

## 2017-12-04 DIAGNOSIS — E1110 Type 2 diabetes mellitus with ketoacidosis without coma: Secondary | ICD-10-CM | POA: Diagnosis present

## 2017-12-04 DIAGNOSIS — E860 Dehydration: Secondary | ICD-10-CM | POA: Diagnosis present

## 2017-12-04 DIAGNOSIS — R000 Tachycardia, unspecified: Secondary | ICD-10-CM | POA: Diagnosis present

## 2017-12-04 DIAGNOSIS — Z9981 Dependence on supplemental oxygen: Secondary | ICD-10-CM | POA: Diagnosis not present

## 2017-12-04 DIAGNOSIS — Z794 Long term (current) use of insulin: Secondary | ICD-10-CM | POA: Diagnosis not present

## 2017-12-04 DIAGNOSIS — E876 Hypokalemia: Secondary | ICD-10-CM | POA: Diagnosis present

## 2017-12-04 DIAGNOSIS — J101 Influenza due to other identified influenza virus with other respiratory manifestations: Secondary | ICD-10-CM | POA: Diagnosis present

## 2017-12-04 DIAGNOSIS — Z7722 Contact with and (suspected) exposure to environmental tobacco smoke (acute) (chronic): Secondary | ICD-10-CM | POA: Diagnosis present

## 2017-12-04 DIAGNOSIS — M1990 Unspecified osteoarthritis, unspecified site: Secondary | ICD-10-CM | POA: Diagnosis present

## 2017-12-04 DIAGNOSIS — B9729 Other coronavirus as the cause of diseases classified elsewhere: Secondary | ICD-10-CM | POA: Diagnosis not present

## 2017-12-04 DIAGNOSIS — B342 Coronavirus infection, unspecified: Secondary | ICD-10-CM | POA: Diagnosis present

## 2017-12-04 DIAGNOSIS — L988 Other specified disorders of the skin and subcutaneous tissue: Secondary | ICD-10-CM | POA: Diagnosis present

## 2017-12-04 DIAGNOSIS — Z803 Family history of malignant neoplasm of breast: Secondary | ICD-10-CM | POA: Diagnosis not present

## 2017-12-04 DIAGNOSIS — J9601 Acute respiratory failure with hypoxia: Secondary | ICD-10-CM | POA: Diagnosis present

## 2017-12-04 DIAGNOSIS — J441 Chronic obstructive pulmonary disease with (acute) exacerbation: Secondary | ICD-10-CM | POA: Diagnosis present

## 2017-12-04 DIAGNOSIS — Z888 Allergy status to other drugs, medicaments and biological substances status: Secondary | ICD-10-CM | POA: Diagnosis not present

## 2017-12-04 DIAGNOSIS — Z833 Family history of diabetes mellitus: Secondary | ICD-10-CM | POA: Diagnosis not present

## 2017-12-04 DIAGNOSIS — N179 Acute kidney failure, unspecified: Secondary | ICD-10-CM | POA: Diagnosis present

## 2017-12-04 DIAGNOSIS — R0902 Hypoxemia: Secondary | ICD-10-CM | POA: Diagnosis present

## 2017-12-04 DIAGNOSIS — Z79899 Other long term (current) drug therapy: Secondary | ICD-10-CM | POA: Diagnosis not present

## 2017-12-04 DIAGNOSIS — L8990 Pressure ulcer of unspecified site, unspecified stage: Secondary | ICD-10-CM | POA: Diagnosis present

## 2017-12-04 LAB — BASIC METABOLIC PANEL
Anion gap: 9 (ref 5–15)
BUN: 17 mg/dL (ref 6–20)
CHLORIDE: 104 mmol/L (ref 101–111)
CO2: 25 mmol/L (ref 22–32)
CREATININE: 0.9 mg/dL (ref 0.44–1.00)
Calcium: 8.4 mg/dL — ABNORMAL LOW (ref 8.9–10.3)
GFR calc Af Amer: >60 mL/min (ref >60)
GFR calc non Af Amer: >60 mL/min (ref >60)
Glucose, Bld: 260 mg/dL — ABNORMAL HIGH (ref 65–99)
Potassium: 3.4 mmol/L — ABNORMAL LOW (ref 3.5–5.1)
SODIUM: 138 mmol/L (ref 135–145)

## 2017-12-04 LAB — URINE CULTURE: Culture: <10000 — AB

## 2017-12-04 LAB — GLUCOSE, CAPILLARY
GLUCOSE-CAPILLARY: 195 mg/dL — AB (ref 65–99)
Glucose-Capillary: 313 mg/dL — ABNORMAL HIGH (ref 65–99)
Glucose-Capillary: 255 mg/dL — ABNORMAL HIGH (ref 65–99)
Glucose-Capillary: 335 mg/dL — ABNORMAL HIGH (ref 65–99)

## 2017-12-04 MED ORDER — NAPROXEN 250 MG PO TABS
375.0000 mg | ORAL_TABLET | Freq: Two times a day (BID) | ORAL | Status: DC
  Administered 2017-12-04 (×2): 375 mg via ORAL
  Filled 2017-12-04 (×3): qty 2

## 2017-12-04 MED ORDER — IPRATROPIUM-ALBUTEROL 0.5-2.5 (3) MG/3ML IN SOLN
3.0000 mL | Freq: Three times a day (TID) | RESPIRATORY_TRACT | Status: DC
  Administered 2017-12-04 – 2017-12-05 (×3): 3 mL via RESPIRATORY_TRACT
  Filled 2017-12-04 (×3): qty 3

## 2017-12-04 MED ORDER — GUAIFENESIN-CODEINE 100-10 MG/5ML PO SOLN
5.0000 mL | Freq: Three times a day (TID) | ORAL | Status: DC
  Administered 2017-12-04 – 2017-12-06 (×7): 5 mL via ORAL
  Filled 2017-12-04 (×7): qty 5

## 2017-12-04 MED ORDER — SODIUM CHLORIDE 0.9 % IV SOLN
INTRAVENOUS | Status: AC
  Administered 2017-12-04: 10:00:00 via INTRAVENOUS

## 2017-12-04 MED ORDER — AMLODIPINE BESYLATE 10 MG PO TABS
10.0000 mg | ORAL_TABLET | Freq: Every day | ORAL | Status: DC
  Administered 2017-12-04 – 2017-12-06 (×3): 10 mg via ORAL
  Filled 2017-12-04 (×3): qty 1

## 2017-12-04 NOTE — Progress Notes (Signed)
Subjective: [name] was seen sleeping comfortably in bed. On awakening she tells me she feels 'miserable' and like she 'got hit by a semi.' She notes worsened myalgias today and has been bothered by a dry cough. Sometimes coughs so much she feels that she is unable to breathe. She has been trying to eat but doesn't have much appetite. She does not feel ready to go home today.  Objective:  Vital signs in last 24 hours: Vitals:   12/03/17 2149 12/04/17 0151 12/04/17 0528 12/04/17 0751  BP: 139/69  (!) 174/89   Pulse: (!) 102  94   Resp: (!) 24     Temp: 99.3 F (37.4 C)  (!) 97.4 F (36.3 C)   TempSrc: Oral  Oral   SpO2: 95% 95% 96% 95%  Weight:   211 lb 3.2 oz (95.8 kg)   Height:       Physical Exam  Constitutional: She is oriented to person, place, and time. She appears well-developed and well-nourished. She is cooperative. She appears ill. No distress.  HENT:  Head: Normocephalic and atraumatic.  Neck: No JVD present.  Cardiovascular: Normal rate, regular rhythm and normal heart sounds.  Respiratory: Effort normal. No stridor. No respiratory distress. She has wheezes (scattered wheezing).  GI: Soft. Bowel sounds are normal. She exhibits no distension. There is no tenderness.  Musculoskeletal: She exhibits tenderness (MSK). She exhibits no edema.  Neurological: She is alert and oriented to person, place, and time.  Skin: Skin is warm and dry. She is not diaphoretic. No erythema.  Psychiatric: She has a normal mood and affect. Her behavior is normal. Judgment and thought content normal.   Assessment/Plan: 77 y.o female with history of COPD, diabetes mellitus, essential hypertension, and arthritis presents from [facility] with hypoxia, generalized malaise and cough. Chest xray without any evidence of pneumonia or effusion. The patient tested positive for influenza b and coronavirus which is likely prompting COPD exacerbation. She also had hyperglycemia with Bennie Pierinikeytones and was treated  briefly for DKA.  Acute respiratory failure with hypoxia COPD exacerbation Influenza B/Coronavirus She continues to complain of malaise, worsened myalgias, fatigue, and cough which sometimes makes her feel that she cant breathe. She becomes dyspneic with ambulation per her report. She is not coughing much up but does feel that she has chest congestion. She has used minimal PRN medications available to her despite encouraging her to do so. She would probably benefit from a more scheduled regimen. I reassured her than the Flu takes time to recover from, and that her cough will likely take some time to fully dissipate after this viral URI. -Gentle IVF while patient with poor PO intake: NS @ 14925mls/hr x 12 hrs -Continue treatment for Influenza with Tamiflu BID, on day 3 -Duonebs Q8H -Scheduled tylenol 650mg  TID and Naproxen 375mg  -Continue 2L via [place] prn -Prednisone 40mg , Day 3  Type 2 Diabetes DKA (Resolved) Mild DKA on admit likely from steroids and acute viral infection. She responded well to IVF and insulin administration and had quick resolution. She was prescribed Lantus 20 units QHS by her PCP however she was unable to get the needles and therefore continued her NPH insulin 25mg  BID. She was transitioned to Lantus 20 units last night, too soon to see if appropriate dosing for her however AM CBG 195 which is improved from prior.  -Lantus 20units qhs, will follow SSI requirements and adjust as necessary  -CBG monitoring 4 times daily -SSI tid wc  -SSI qhs  Essential  Hypertension She was hypertensive this morning at 174/89 with pulse of 94 however had previously been mostly well controlled while continuing her Lisinopril 20 mg daily here. I will resume her home Amlodipine 10mg .  -Resume home Amlodipine 10mg  today -Continue lisinopril 20mg  qd  AKI Hypokalemia She had AKI on admission likely from dehydration related to her acute illness. This has now resolved and most recent Cr was 1. I am  still waiting on todays BMET to evaluate her renal function and electrolytes, however will follow and replace K as needed.   Dispo: Anticipated discharge in approximately 1-2 day(s). PT recommends SNF vs return to [facility]. SNF may be an appropriate option for this patient as it will likely take her an extended period of time to fully get back to baseline.  Gabriella Revere, DO 12/04/2017, 8:31 AM Pager: [phone]

## 2017-12-04 NOTE — Evaluation (Signed)
Occupational Therapy Evaluation Patient Details Name: Isabel Ardila MRN: [id number] DOB: [birth year] Today's Date: 12/04/2017    History of Present Illness Patient was admitted to the hospital from the [facility] with dyspnea. Positive for influenza B. At baseline she was not using O2. She is on 2L today. PMH: HTN, CHF, Arthritis; DMII    Clinical Impression   PTA, pt had assistance for dressing and bathing, used rollator for functional mobility, and participates in [facility] program. She reports weekly falls recently. She currently presents with decreased activity tolerance for ADL and functional mobility requiring min assist for LB ADL and min guard assist for short duration ADL tasks. She required min guard assist for standing grooming tasks. Pt requires multiple rest breaks and did report episode of dizziness on return to chair from sink that resolved with seated rest break. Pt would benefit from continued OT services while admitted to improve independence and safety with ADL and functional mobility. Recommend continued therapies via [facility] program post-acute D/C. Feel, pt will need increased assistance at home and if unable to obtain may need to consider short-term SNF rehabilitation. OT will continue to follow while admitted.     Follow Up Recommendations  SNF;Supervision/Assistance - 24 hour(SNF vs therapies with [facility] )    Equipment Recommendations  None recommended by OT(has needs met)    Recommendations for Other Services       Precautions / Restrictions Precautions Precautions: Fall Restrictions Weight Bearing Restrictions: No      Mobility Bed Mobility               General bed mobility comments: OOB in chair on arrival  Transfers Overall transfer level: Needs assistance Equipment used: Rolling walker (2 wheeled) Transfers: Sit to/from Stand Sit to Stand: Min guard         General transfer comment: Min guard assist for stability.    Balance Overall balance  assessment: Needs assistance Sitting-balance support: No upper extremity supported Sitting balance-Leahy Scale: Good     Standing balance support: Bilateral upper extremity supported Standing balance-Leahy Scale: Poor Standing balance comment: Relies on BUE support                           ADL either performed or assessed with clinical judgement   ADL Overall ADL's : Needs assistance/impaired Eating/Feeding: Sitting;Set up   Grooming: Min guard;Standing   Upper Body Bathing: Min guard;Sitting   Lower Body Bathing: Minimal assistance;Sit to/from stand   Upper Body Dressing : Min guard;Sitting   Lower Body Dressing: Minimal assistance;Sit to/from stand   Toilet Transfer: Ambulation;RW;Min guard   Toileting- Water quality scientist and Hygiene: Sit to/from stand;Min guard       Functional mobility during ADLs: Rolling walker;Min guard General ADL Comments: Pt unstable and with poor activity tolerance.      Vision Patient Visual Report: No change from baseline       Perception     Praxis      Pertinent Vitals/Pain       Hand Dominance Right   Extremity/Trunk Assessment Upper Extremity Assessment Upper Extremity Assessment: Overall WFL for tasks assessed   Lower Extremity Assessment Lower Extremity Assessment: Overall WFL for tasks assessed       Communication Communication Communication: No difficulties   Cognition Arousal/Alertness: Awake/alert Behavior During Therapy: WFL for tasks assessed/performed Overall Cognitive Status: Within Functional Limits for tasks assessed  General Comments  On 2L O2 via [place]    Exercises     Shoulder Instructions      Home Living Family/patient expects to be discharged to:: Private residence Living Arrangements: Children Available Help at Discharge: Family;Personal care attendant;Other (Comment)(goes to [facility] 3 days/week) Type of Home: Apartment Home  Access: Stairs to enter Entrance Stairs-Number of Steps: 16   Home Layout: One level     Bathroom Shower/Tub: Tub/shower unit         Home Equipment: Environmental consultant - 4 wheels;Shower seat;Grab bars - tub/shower;Hand held shower head          Prior Functioning/Environment Level of Independence: Needs assistance  Gait / Transfers Assistance Needed: Uses rollator for mobility with frequent breaks required ADL's / Homemaking Assistance Needed: Requires assistance for bathing. CNA assists with dressing/grooming in the mornings 3 days per week.   Comments: Goes to [facility] during the day 3 days per week.         OT Problem List:        OT Treatment/Interventions: Self-care/ADL training;Therapeutic exercise;Energy conservation;DME and/or AE instruction;Therapeutic activities;Patient/family education;Balance training    OT Goals(Current goals can be found in the care plan section) Acute Rehab OT Goals Patient Stated Goal: to breath better with activity  OT Goal Formulation: With patient Time For Goal Achievement: 12/18/17 Potential to Achieve Goals: Good ADL Goals Pt Will Perform Grooming: standing;with supervision Pt Will Perform Lower Body Dressing: with supervision;sit to/from stand Pt Will Transfer to Toilet: ambulating;with modified independence Pt Will Perform Tub/Shower Transfer: with min guard assist;shower seat;ambulating;rolling walker Additional ADL Goal #1: Pt will verbalize 3 strategies to conserve energy during morning ADL routine.  OT Frequency: Min 2X/week   Barriers to D/C:            Co-evaluation              AM-PAC PT "6 Clicks" Daily Activity     Outcome Measure Help from another person eating meals?: A Little Help from another person taking care of personal grooming?: A Little Help from another person toileting, which includes using toliet, bedpan, or urinal?: A Little Help from another person bathing (including washing, rinsing, drying)?: A Little Help  from another person to put on and taking off regular upper body clothing?: A Little Help from another person to put on and taking off regular lower body clothing?: A Little 6 Click Score: 18   End of Session Equipment Utilized During Treatment: Gait belt;Rolling walker Nurse Communication: Mobility status  Activity Tolerance: Patient tolerated treatment well Patient left: with call bell/phone within reach;in chair  OT Visit Diagnosis: Other abnormalities of gait and mobility (R26.89);Muscle weakness (generalized) (M62.81)                Time: 5993-5701 OT Time Calculation (min): 26 min Charges:  OT General Charges $OT Visit: 1 Visit OT Evaluation $OT Eval Moderate Complexity: 1 Mod OT Treatments $Self Care/Home Management : 8-22 mins G-Codes:     Norman Herrlich, MS OTR/L  Pager: [place] A Fred Franzen 12/04/2017, 4:29 PM

## 2017-12-05 DIAGNOSIS — N179 Acute kidney failure, unspecified: Secondary | ICD-10-CM

## 2017-12-05 LAB — CBC WITH DIFFERENTIAL/PLATELET
BASOS ABS: 0 10*3/uL (ref 0.0–0.1)
BASOS PCT: 0 %
EOS ABS: 0 10*3/uL (ref 0.0–0.7)
EOS PCT: 0 %
HCT: 38.2 % (ref 36.0–46.0)
Hemoglobin: 13 g/dL (ref 12.0–15.0)
Lymphocytes Relative: 40 %
Lymphs Abs: 3 10*3/uL (ref 0.7–4.0)
MCH: 30 pg (ref 26.0–34.0)
MCHC: 34 g/dL (ref 30.0–36.0)
MCV: 88.2 fL (ref 78.0–100.0)
Monocytes Absolute: 0.4 10*3/uL (ref 0.1–1.0)
Monocytes Relative: 6 %
Neutro Abs: 4 10*3/uL (ref 1.7–7.7)
Neutrophils Relative %: 54 %
PLATELETS: 168 10*3/uL (ref 150–400)
RBC: 4.33 MIL/uL (ref 3.87–5.11)
RDW: 13.2 % (ref 11.5–15.5)
WBC: 7.5 10*3/uL (ref 4.0–10.5)

## 2017-12-05 LAB — BASIC METABOLIC PANEL
Anion gap: 10 (ref 5–15)
BUN: 15 mg/dL (ref 6–20)
CO2: 26 mmol/L (ref 22–32)
CREATININE: 0.84 mg/dL (ref 0.44–1.00)
Calcium: 8.5 mg/dL — ABNORMAL LOW (ref 8.9–10.3)
Chloride: 101 mmol/L (ref 101–111)
Glucose, Bld: 238 mg/dL — ABNORMAL HIGH (ref 65–99)
Potassium: 3.6 mmol/L (ref 3.5–5.1)
SODIUM: 137 mmol/L (ref 135–145)

## 2017-12-05 LAB — GLUCOSE, CAPILLARY
GLUCOSE-CAPILLARY: 316 mg/dL — AB (ref 65–99)
Glucose-Capillary: 247 mg/dL — ABNORMAL HIGH (ref 65–99)
Glucose-Capillary: 327 mg/dL — ABNORMAL HIGH (ref 65–99)
Glucose-Capillary: 333 mg/dL — ABNORMAL HIGH (ref 65–99)

## 2017-12-05 MED ORDER — NAPROXEN 250 MG PO TABS
500.0000 mg | ORAL_TABLET | Freq: Two times a day (BID) | ORAL | Status: DC
  Administered 2017-12-05 – 2017-12-06 (×3): 500 mg via ORAL
  Filled 2017-12-05 (×3): qty 2

## 2017-12-05 MED ORDER — INSULIN GLARGINE 100 UNIT/ML ~~LOC~~ SOLN
5.0000 [IU] | SUBCUTANEOUS | Status: AC
  Administered 2017-12-05: 5 [IU] via SUBCUTANEOUS
  Filled 2017-12-05: qty 0.05

## 2017-12-05 MED ORDER — INSULIN GLARGINE 100 UNIT/ML ~~LOC~~ SOLN
25.0000 [IU] | Freq: Every day | SUBCUTANEOUS | Status: DC
  Administered 2017-12-05: 25 [IU] via SUBCUTANEOUS
  Filled 2017-12-05 (×2): qty 0.25

## 2017-12-05 MED ORDER — LISINOPRIL 20 MG PO TABS
20.0000 mg | ORAL_TABLET | Freq: Every day | ORAL | Status: DC
  Administered 2017-12-05 – 2017-12-06 (×2): 20 mg via ORAL
  Filled 2017-12-05 (×2): qty 1

## 2017-12-05 MED ORDER — IPRATROPIUM-ALBUTEROL 0.5-2.5 (3) MG/3ML IN SOLN
3.0000 mL | Freq: Four times a day (QID) | RESPIRATORY_TRACT | Status: DC
  Administered 2017-12-05 (×2): 3 mL via RESPIRATORY_TRACT
  Filled 2017-12-05 (×2): qty 3

## 2017-12-05 MED ORDER — GERHARDT'S BUTT CREAM
TOPICAL_CREAM | Freq: Two times a day (BID) | CUTANEOUS | Status: DC
  Administered 2017-12-05 – 2017-12-06 (×3): via TOPICAL
  Filled 2017-12-05: qty 1

## 2017-12-05 NOTE — Plan of Care (Signed)
Progressing

## 2017-12-05 NOTE — Consult Note (Signed)
WOC Nurse wound consult note Reason for Consult:moisture associated skin damage (MASD)to bilateral buttocks.  Has been acutely ill and unable to care for self.  Came in to ED wearing an adult brief.   Wound type: MASD with partial thickness skin loss.  Pressure Injury POA: NA Measurement:bilateral buttocks"  3 cm x 1 cm x 0.1 cm  Wound ZOX:WRUEbed:pink and moist Drainage (amount, consistency, odor) scant weeping  No odor Periwound:intact Dressing procedure/placement/frequency:No disposable briefs or underpads.  Cleanse buttocks twice daily and PRN soilage with soap and water.  Apply Gerhardts butt paste.  Turn and reposition every two hours.  Keep skin in contact with therapeutic linen to improve skin microclimate.  Will not follow at this time.  Please re-consult if needed.  Maple HudsonKaren Louay Myrie RN BSN CWON Pager [phone]

## 2017-12-05 NOTE — Progress Notes (Signed)
Pt. Weaned to 1 L [place] sating at 94%.

## 2017-12-05 NOTE — Progress Notes (Signed)
Pt. Pulled scab off of right upper abdomen. Upon assessment dermis is exposed and bleeding. This RN cleaned area, and put 2 2x2 on exposed area. MD's notified.

## 2017-12-05 NOTE — Progress Notes (Signed)
  Date: 12/05/2017  Patient name: Gabriella [name]  Medical record number: [id number]  Date of birth: [birth year]   I have seen and evaluated this patient and I have discussed the plan of care with the house staff. Please see their note for complete details. I concur with their findings with the following additions/corrections:   Feeling somewhat better today, but still complaining of significant cough, muscle aches, discomfort, and fatigue. Good air movement on exam, but with persistent wheezing and cough induced by deep inspiration. We will continue treatment for her COPD exacerbation that was triggered by coronavirus and influenza infection. I'm hopeful that she may be able to go home tomorrow.  Jessy OtoAlexander Raines, M.D., Ph.D. 12/05/2017, 4:58 PM

## 2017-12-05 NOTE — Progress Notes (Signed)
CSW evaluating for SNF based on PT recommendation.  [facility] [facility]  [facility] working with pt and family to transition patient back home at time of DC and do not think SNF is needed at this time.  [facility] will set up any home health services/equipment at time of DC.  No further CSW needs at this time- signing off  Burna SisJenna H. Eliza Green, LCSW Clinical Social Worker [phone]

## 2017-12-05 NOTE — Progress Notes (Signed)
Physical Therapy Treatment Patient Details Name: Gabriella [name] MRN: [id number] DOB: 12/20/1940 Today's Date: 12/05/2017    History of Present Illness Patient was admitted to the hospital from the [facility] with dyspnea. Positive for influenza B. At baseline she was not using O2. She is on 2L today. PMH: HTN, CHF, Arthritis; DMII     PT Comments    Pt received in bed. Agreeable to bed exercises only due to fibromyalgia pain.  Pt on 2 L O2 throughout session with SpO2 93-95%. Max HR during exercise 105.  Follow Up Recommendations  SNF;Other (comment)(back to [facility] vs SNF)     Equipment Recommendations  None recommended by PT    Recommendations for Other Services       Precautions / Restrictions Precautions Precautions: Fall    Mobility  Bed Mobility         Supine to sit: Min assist        Transfers                 General transfer comment: Pt declining OOB due to fibromyalgia pain.  Ambulation/Gait                 Stairs            Wheelchair Mobility    Modified Rankin (Stroke Patients Only)       Balance                                            Cognition Arousal/Alertness: Awake/alert Behavior During Therapy: WFL for tasks assessed/performed Overall Cognitive Status: Within Functional Limits for tasks assessed                                        Exercises General Exercises - Lower Extremity Ankle Circles/Pumps: AROM;Both;10 reps;Supine Gluteal Sets: AROM;10 reps;Both;Supine Heel Slides: AROM;10 reps;Supine;Right;Left Hip ABduction/ADduction: AROM;Both;10 reps;Supine Straight Leg Raises: AROM;10 reps;Supine;Right;Left    General Comments        Pertinent Vitals/Pain Pain Assessment: Faces Faces Pain Scale: Hurts a little bit Pain Location: generalized (fibromyalgia) Pain Descriptors / Indicators: Aching Pain Intervention(s): Limited activity within patient's tolerance    Home  Living                      Prior Function            PT Goals (current goals can now be found in the care plan section) Acute Rehab PT Goals Patient Stated Goal: to breath better with activity  PT Goal Formulation: With patient Time For Goal Achievement: 12/10/17 Potential to Achieve Goals: Good Progress towards PT goals: Progressing toward goals    Frequency    Min 4X/week      PT Plan Current plan remains appropriate    Co-evaluation              AM-PAC PT "6 Clicks" Daily Activity  Outcome Measure  Difficulty turning over in bed (including adjusting bedclothes, sheets and blankets)?: A Little Difficulty moving from lying on back to sitting on the side of the bed? : A Little Difficulty sitting down on and standing up from a chair with arms (e.g., wheelchair, bedside commode, etc,.)?: A Little Help needed moving to and from a bed to chair (including a wheelchair)?:  A Little Help needed walking in hospital room?: A Lot Help needed climbing 3-5 steps with a railing? : A Lot 6 Click Score: 16    End of Session   Activity Tolerance: Patient limited by pain Patient left: in bed;with call bell/phone within reach;with bed alarm set Nurse Communication: Mobility status PT Visit Diagnosis: Unsteadiness on feet (R26.81);Other abnormalities of gait and mobility (R26.89);Repeated falls (R29.6);Muscle weakness (generalized) (M62.81)     Time: [id number] PT Time Calculation (min) (ACUTE ONLY): 13 min  Charges:  $Therapeutic Exercise: 8-22 mins                    G Codes:       Aida RaiderWendy Chidi Shirer, PT  Office # [phone] Pager [phone]    Ilda FoilGarrow, Aricka Goldberger Rene 12/05/2017, 10:02 AM

## 2017-12-05 NOTE — Progress Notes (Signed)
   Subjective: [name] was seen laying in her bed this morning. She stated that she is still having wheezing, sore throat, and productive cough but is unable to bring up sputum. She states that she has also been having myalgias.   Objective:  Vital signs in last 24 hours: Vitals:   12/04/17 2146 12/04/17 2354 12/05/17 0508 12/05/17 0759  BP: (!) 144/73  (!) 163/87   Pulse: 96 (!) 107 84   Resp: 16 18 20    Temp: 99.1 F (37.3 C)  98.2 F (36.8 C)   TempSrc: Oral  Oral   SpO2: 97% 97% 96% 94%  Weight:   208 lb 5.4 oz (94.5 kg)   Height:       Physical Exam  Constitutional: She appears well-developed and well-nourished. No distress.  HENT:  Head: Normocephalic and atraumatic.  Eyes: Conjunctivae are normal.  Cardiovascular: Normal rate, regular rhythm and normal heart sounds.  No murmur heard. Respiratory: Effort normal. She has wheezes (diffuse wheezing).  Currently on 2L via [place]  GI: Soft. Bowel sounds are normal. She exhibits no distension. There is tenderness (bilateral upper quadrants to deep palpation).  Musculoskeletal: She exhibits no edema.  Neurological: She is alert.  Skin: She is not diaphoretic. No erythema.  Psychiatric: She has a normal mood and affect. Her behavior is normal. Judgment and thought content normal.   Assessment/Plan:  77 y.o female with history of COPD,diabetes mellitus,essential hypertension, and arthritis presents from [facility] with hypoxia, generalized malaise and cough.Chest xray without any evidence of pneumonia or effusion. The patient tested positive for influenza b and coronavirus which is likely prompting COPD exacerbation.She also had hyperglycemia with Bennie Pierinikeytones and was treated briefly for DKA.  Acute respiratory failure with hypoxia COPD exacerbation Influenza B/Coronavirus The patient states that she feels that her breathing has improved. She still states that she has some generalized muscle tenderness and she feels that she still has  chest congestion.  -Continue Tamiflu BID, day 4 -Duonebs Q8H -Scheduled tylenol 650mg  TID and Naproxen 375mg  -Continue 2L via [place] prn -Prednisone 40mg , Day 4  Type 2 Diabetes DKA (Resolved) The patient's blood glucose over the past 24 hrs has ranged 238-316.  -Increase Lantus from 20units qhs to 25units qhs -CBG monitoring 4 times daily -SSI tid wc  -SSI qhs  Essential Hypertension The patient's blood pressure this morning was 163/87.  -Continue home Amlodipine 10mg  today -Continue lisinopril 20mg  qd  AKI Hypokalemia Resolved. Will monitor bmp  Dispo: Anticipated discharge in approximately 1-2 day(s). PT recommends SNF vs return to [facility]. Spoke to patient today and she stated that she does not want to go to SNF.   Dispo: Anticipated discharge in approximately 1 day.   Gabriella [name], Gabriella Kirker, MD 12/05/2017, 9:05 AM Pager: [phone]

## 2017-12-06 DIAGNOSIS — Z9981 Dependence on supplemental oxygen: Secondary | ICD-10-CM

## 2017-12-06 DIAGNOSIS — Z9109 Other allergy status, other than to drugs and biological substances: Secondary | ICD-10-CM

## 2017-12-06 LAB — BASIC METABOLIC PANEL
Anion gap: 11 (ref 5–15)
BUN: 17 mg/dL (ref 6–20)
CO2: 24 mmol/L (ref 22–32)
Calcium: 8.4 mg/dL — ABNORMAL LOW (ref 8.9–10.3)
Chloride: 101 mmol/L (ref 101–111)
Creatinine, Ser: 0.81 mg/dL (ref 0.44–1.00)
GFR calc Af Amer: >60 mL/min (ref >60)
GFR calc non Af Amer: >60 mL/min (ref >60)
Glucose, Bld: 220 mg/dL — ABNORMAL HIGH (ref 65–99)
Potassium: 3.9 mmol/L (ref 3.5–5.1)
Sodium: 136 mmol/L (ref 135–145)

## 2017-12-06 LAB — GLUCOSE, CAPILLARY
GLUCOSE-CAPILLARY: 363 mg/dL — AB (ref 65–99)
Glucose-Capillary: 147 mg/dL — ABNORMAL HIGH (ref 65–99)
Glucose-Capillary: 317 mg/dL — ABNORMAL HIGH (ref 65–99)

## 2017-12-06 MED ORDER — IPRATROPIUM-ALBUTEROL 0.5-2.5 (3) MG/3ML IN SOLN
3.0000 mL | Freq: Four times a day (QID) | RESPIRATORY_TRACT | Status: DC
  Administered 2017-12-06 (×2): 3 mL via RESPIRATORY_TRACT
  Filled 2017-12-06 (×3): qty 3

## 2017-12-06 MED ORDER — GUAIFENESIN-CODEINE 100-10 MG/5ML PO SOLN: 5.0000 mL | Freq: Four times a day (QID) | ORAL | Status: DC | PRN

## 2017-12-06 MED ORDER — GUAIFENESIN ER 600 MG PO TB12: 600.0000 mg | ORAL_TABLET | Freq: Two times a day (BID) | ORAL | 0 refills | Status: AC

## 2017-12-06 MED ORDER — GERHARDT'S BUTT CREAM: 1.0000 "application " | TOPICAL_CREAM | Freq: Two times a day (BID) | CUTANEOUS | 0 refills | Status: AC

## 2017-12-06 MED ORDER — GUAIFENESIN ER 600 MG PO TB12
600.0000 mg | ORAL_TABLET | Freq: Two times a day (BID) | ORAL | Status: DC
  Administered 2017-12-06: 600 mg via ORAL
  Filled 2017-12-06: qty 1

## 2017-12-06 MED ORDER — IPRATROPIUM-ALBUTEROL 0.5-2.5 (3) MG/3ML IN SOLN: 3.0000 mL | RESPIRATORY_TRACT | 0 refills | Status: DC | PRN

## 2017-12-06 NOTE — Progress Notes (Signed)
SATURATION QUALIFICATIONS: (This note is used to comply with regulatory documentation for home oxygen)  Patient Saturations on Room Air at Rest = 96%  Patient Saturations on Room Air while Ambulating = 88%  Patient Saturations on 3 Liters of oxygen while Ambulating = 92%  Please briefly explain why patient needs home oxygen:

## 2017-12-06 NOTE — Progress Notes (Signed)
Physical Therapy Treatment Patient Details Name: Gabriella [name] MRN: [id number] DOB: Mar 08, 1941 Today's Date: 12/06/2017    History of Present Illness Patient was admitted to the hospital from the [facility] with dyspnea. Positive for influenza B. At baseline she was not using O2. She is on 2L today. PMH: HTN, CHF, Arthritis; DMII     PT Comments    Patient progressing with standing tolerance and mobility this session able to move to chair and sit up OOB for awhile.  Was noting she did not get much sleep due to awakened at 4 am, but still participated in session without much encouragement.  She likely can return home with assist and work on rehab at [employer] [employer]  PT to follow acutely.  No signs of orthostatic hypotension this session.   Follow Up Recommendations  SNF;Other (comment)(back to [facility] vs. SNF)     Equipment Recommendations  None recommended by PT    Recommendations for Other Services       Precautions / Restrictions Precautions Precautions: Fall    Mobility  Bed Mobility Overal bed mobility: Needs Assistance Bed Mobility: Supine to Sit     Supine to sit: Min guard;HOB elevated     General bed mobility comments: use of rail, increased time  Transfers Overall transfer level: Needs assistance Equipment used: Rolling walker (2 wheeled) Transfers: Sit to/from [employer] Sit to Stand: Min guard Stand pivot transfers: Min guard       General transfer comment: to chair due to fatigued in standing from hygiene and BP check  Ambulation/Gait                 Stairs            Wheelchair Mobility    Modified Rankin (Stroke Patients Only)       Balance Overall balance assessment: Needs assistance Sitting-balance support: No upper extremity supported Sitting balance-Leahy Scale: Good     Standing balance support: Bilateral upper extremity supported Standing balance-Leahy Scale: Poor Standing balance comment: stood about 3  minutes during hygiene and BP check                            Cognition Arousal/Alertness: Awake/alert Behavior During Therapy: WFL for tasks assessed/performed Overall Cognitive Status: Within Functional Limits for tasks assessed                                        Exercises General Exercises - Lower Extremity Ankle Circles/Pumps: AROM;10 reps;Supine;Both Short Arc Quad: AROM;10 reps;Both;Supine Heel Slides: AROM;10 reps;Both;Supine Hip ABduction/ADduction: AROM;10 reps;Both;Supine    General Comments General comments (skin integrity, edema, etc.): see orthostatics      Pertinent Vitals/Pain Faces Pain Scale: Hurts even more Pain Location: head and chest Pain Descriptors / Indicators: Sore;Aching Pain Intervention(s): Repositioned;Monitored during session;Limited activity within patient's tolerance  Orthostatic VS for the past 24 hrs (Last 3 readings):  BP- Lying BP- Sitting BP- Standing at 0 minutes  12/06/17 0900 147/77 147/83 137/68      Home Living                      Prior Function            PT Goals (current goals can now be found in the care plan section) Progress towards PT goals: Progressing toward goals  Frequency    Min 3X/week      PT Plan Frequency needs to be updated    Co-evaluation              AM-PAC PT "6 Clicks" Daily Activity  Outcome Measure  Difficulty turning over in bed (including adjusting bedclothes, sheets and blankets)?: A Little Difficulty moving from lying on back to sitting on the side of the bed? : A Little Difficulty sitting down on and standing up from a chair with arms (e.g., wheelchair, bedside commode, etc,.)?: A Lot Help needed moving to and from a bed to chair (including a wheelchair)?: A Little Help needed walking in hospital room?: A Lot Help needed climbing 3-5 steps with a railing? : A Lot 6 Click Score: 15    End of Session Equipment Utilized During Treatment:  Gait belt;Oxygen Activity Tolerance: Patient limited by fatigue Patient left: in chair;with call bell/phone within reach Nurse Communication: Mobility status PT Visit Diagnosis: Unsteadiness on feet (R26.81);Other abnormalities of gait and mobility (R26.89);Repeated falls (R29.6);Muscle weakness (generalized) (M62.81)     Time: [id number] PT Time Calculation (min) (ACUTE ONLY): 23 min  Charges:  $Therapeutic Exercise: 8-22 mins $Therapeutic Activity: 8-22 mins                    G CodesSheran Lawless:       Cyndi Kelden [place], [place] CarolinaPT [phone] 12/06/2017    Elray Mcgregorynthia Marquita Lias 12/06/2017, 10:28 AM

## 2017-12-06 NOTE — Progress Notes (Signed)
OT Cancellation Note  Patient Details Name: Gabriella [name] MRN: [id number] DOB: 1941/08/08   Cancelled Treatment:    Reason Eval/Treat Not Completed: Patient declined, no reason specified Pt reports calling for staff to (A) her but when Ot offers to help with patients needs she declines toileting*(ill use this thing, I have this tube- pt prefer to use the purewick, dressing ( my daughter will do that so I dont get my clothes dirty, encouraged to get up hourly to do a toileting program to decr pressure on bladder - declines) repositioning (I feel awful ill just lay here now). OT informed RN. OT checked oxygen since patient was on room air 92% at this time supine.   Boone MasterJones, Cordarius Benning B 12/06/2017, 2:46 PM

## 2017-12-06 NOTE — Progress Notes (Signed)
Rubbie BattiestMartha Keiper to be D/C'd Home per MD order.  Discussed with the patient and all questions fully answered. Pt waiting on ride and home oxygen to be delivered   VSS, Skin clean, dry and intact without evidence of skin break down, no evidence of skin tears noted. IV catheter discontinued intact. Site without signs and symptoms of complications. Dressing and pressure applied.  An After Visit Summary was printed and given to the patient. Patient received prescription.  D/c education completed with patient/family including follow up instructions, medication list, d/c activities limitations if indicated, with other d/c instructions as indicated by MD - patient able to verbalize understanding, all questions fully answered.   Patient instructed to return to ED, call 911, or call MD for any changes in condition.   Patient escorted via WC, and D/C home via private auto.  Eligah Eastrin M Jackson Fetters 12/06/2017 5:02 PM

## 2017-12-06 NOTE — Discharge Instructions (Signed)
It was a pleasure to take care of you Gabriella [name]. During your hospitalization you were taken care of for a COPD exacerbation that was likely caused due to you having the flu. Please take all of your medication as instructed on your discharge paperwork. It will likely take 1-2 weeks for your symptoms to completely resolve. Please follow up with Dr. Mayford KnifeWilliams

## 2017-12-06 NOTE — Progress Notes (Addendum)
   Subjective: [name] was seen sitting up in her bed this morning. She states that she still has chest congestion and sore throat.   Objective:  Vital signs in last 24 hours: Vitals:   12/05/17 1518 12/05/17 2115 12/05/17 2259 12/06/17 0451  BP:  (!) 159/74  (!) 158/83  Pulse:  88  84  Resp:  18  18  Temp:  98.7 F (37.1 C)  98.1 F (36.7 C)  TempSrc:  Oral  Oral  SpO2: 93% 95% 96% 97%  Weight:      Height:       Physical Exam  Constitutional: She appears well-developed and well-nourished. No distress.  HENT:  Head: Normocephalic and atraumatic.  Eyes: Conjunctivae are normal.  Cardiovascular: Normal rate, regular rhythm and normal heart sounds.  Respiratory: Effort normal. No respiratory distress. She has wheezes (mild amount of generalized wheezing).  GI: Soft. Bowel sounds are normal. She exhibits no distension. There is no tenderness.  Neurological: She is alert.  Skin: She is not diaphoretic. No erythema.  Psychiatric: She has a normal mood and affect. Her behavior is normal. Judgment and thought content normal.    Assessment/Plan: 77 y.o female with history of COPD,diabetes mellitus,essential hypertension, and arthritis [facility] with hypoxia, generalized malaise and cough.Chest xray without any evidence of pneumonia or effusion. The patient tested positive for influenzab and coronaviruswhich is likely prompting COPD exacerbation.She also had hyperglycemia with Bennie Pierinikeytones and was treated briefly for DKA.  Acute respiratory failurewith hypoxia COPD exacerbation Influenza B/Coronavirus The patient states that she feels that her breathing has improved. She still states that she has some generalized muscle tenderness and she feels that she still has chest congestion.  -Patient received complete course of Tamiflu BID, day 5 -Duonebs Q8H -Scheduled tylenol 650mg  TID and Naproxen 375mg  -Continue 2L via [place] -Prednisone 40mg , Day 5 completed prednisone  course -Started mucinex 600mg  qd -Home oxygen 3L given during ambulation as the patient desaturated after ambulation. Need to asses long term need for this at hospital follow up appointment   Type 2 Diabetes DKA(Resolved) The patient's blood glucose over the past 24 hrs has ranged 220-333.  -Increase Lantus from 20units qhsto 25units qhs -CBG monitoring 4 times daily -SSI tid wc  -SSI qhs  Essential Hypertension The patient's blood pressure this morning was 158-162/73-83.  -Continue home Amlodipine 10mg  today -Continue lisinopril 20mg  qd  AKI Hypokalemia Resolved.   Dispo: Anticipated discharge in approximately today. Patient and patient's daughter both expressed that they would like the patient to be discharged home with [facility] services. I spoke with patient's daughter yesterday who mentioned that she has gotten a 2 bedroom apt and arranged for the patient to get a hospital bed that she can sleep in to avoid her from getting bedsores.   Gabriella [name], Gabriella Sprowl, MD 12/06/2017, 6:39 AM Pager: [phone]

## 2017-12-06 NOTE — Care Management Note (Addendum)
Case Management Note  Patient Details  Name: Gabriella [name] MRN: [id number] Date of Birth: [birth year]  Subjective/Objective:   Influenza B, SOB             [name] (Daughter)     [phone]       12/06/2017 @ 1315 Per [facility] liaison/Donna stated [facility]  Action/Plan: Transition to home today with the resumption of [facility] to follow. NCM spoke with [facility] / Jeanice LimHolly SW regarding pt's DME needs, oxygen and nebulizer. Jeanice LimHolly stated she will get DME needs authorized and [facility]  Daughter,Teresa, to provide transportation to home today by 4:30pm after completing work. [facility] to provide portable oxygen tank for transportation to home.   Expected Discharge Date:  12/06/17               Expected Discharge Plan:  Home/Self Care([facility])  In-House Referral:     Discharge planning Services  CM Consult  Post Acute Care Choice:    Choice offered to:  Patient, Adult Children  DME Arranged:  Nebulizer machine, Oxygen DME Agency:  [facility]  HH Arranged:   N/A HH Agency:   N/A  Status of Service:  Completed, signed off  If discussed at Long Length of Stay Meetings, dates discussed:    Additional Comments:  Epifanio LeschesCole, Ryka Beighley Hudson, RN 12/06/2017, 1:17 PM

## 2017-12-09 NOTE — Discharge Summary (Signed)
Name: Gabriella [name] MRN: [id number] DOB: 10-21-1940 77 y.o. PCP: Miguel Aschoff, MD  Date of Admission: 12/02/2017  1:56 PM Date of Discharge: 12/06/2017 Attending Physician: Dr. Casandra Doffing  Discharge Diagnosis:  COPD exacerbation secondary to viral infection  Discharge Medications: Allergies as of 12/06/2017      Reactions   Other Diarrhea, Nausea And Vomiting, Other (See Comments)   "Some new pain med given [facility], but I don't know what its called." patient states "it knocked me out and I got sweaty and they gave me benadryl".       Medication List    TAKE these medications   acetaminophen 325 MG tablet Commonly known as:  TYLENOL Take 325 mg by mouth as needed.   amLODipine 10 MG tablet Commonly known as:  NORVASC Take 10 mg by mouth daily.   budesonide-formoterol 160-4.5 MCG/ACT inhaler Commonly known as:  SYMBICORT Inhale 2 puffs into the lungs daily as needed.   diphenhydramine-acetaminophen 25-500 MG Tabs tablet Commonly known as:  TYLENOL PM Take 4 tablets by mouth at bedtime.   DULoxetine 60 MG capsule Commonly known as:  CYMBALTA Take 60 mg by mouth daily.   gabapentin 600 MG tablet Commonly known as:  NEURONTIN Take 600 mg by mouth 3 (three) times daily.   Gerhardt's butt cream Crea Apply 1 application topically 2 (two) times daily.   guaiFENesin 600 MG 12 hr tablet Commonly known as:  MUCINEX Take 1 tablet (600 mg total) by mouth 2 (two) times daily.   HUMULIN 70/30 (70-30) 100 UNIT/ML injection Generic drug:  insulin NPH-regular Human Inject 25 Units into the skin 2 (two) times daily.   insulin glargine 100 UNIT/ML injection Commonly known as:  LANTUS Inject 20 Units into the skin at bedtime.   ipratropium-albuterol 0.5-2.5 (3) MG/3ML Soln Commonly known as:  DUONEB Take 3 mLs by nebulization every 4 (four) hours as needed.   ketorolac 0.5 % ophthalmic solution Commonly known as:  ACULAR Place 1 drop into the  right eye 4 (four) times daily.   lisinopril 20 MG tablet Commonly known as:  PRINIVIL,ZESTRIL Take 20 mg by mouth daily.   Melatonin 10 MG Tabs Take 10 mg by mouth at bedtime.   metFORMIN 500 MG 24 hr tablet Commonly known as:  GLUCOPHAGE-XR Take 500 mg by mouth 2 (two) times daily.   nystatin powder Commonly known as:  MYCOSTATIN/NYSTOP Apply 1 application topically daily as needed.   prednisoLONE acetate 1 % ophthalmic suspension Commonly known as:  PRED FORTE Place 1 drop into the right eye 4 (four) times daily.   PROVENTIL HFA 108 (90 Base) MCG/ACT inhaler Generic drug:  albuterol Inhale 2 puffs into the lungs as needed.   rosuvastatin 10 MG tablet Commonly known as:  CRESTOR Take 20 mg by mouth at bedtime.   tiotropium 18 MCG inhalation capsule Commonly known as:  SPIRIVA Place 18 mcg into inhaler and inhale daily as needed.   traZODone 50 MG tablet Commonly known as:  DESYREL Take 50 mg by mouth at bedtime.   trimethoprim-polymyxin b ophthalmic solution Commonly known as:  POLYTRIM Place 1 drop into the right eye 4 (four) times daily.       Disposition and follow-up:   [name] was discharged from [facility] in stable condition.  At the hospital follow up visit please address:  1.  COPD exacerbation secondary to viral infection: please ensure that the patient's breathing has improved. Please consider decreasing patients  oxygen requirement if she no longer needs.   2.  Labs / imaging needed at time of follow-up: none  3.  Pending labs/ test needing follow-up: none  Follow-up Appointments: Follow-up Information    Miguel Aschoff, MD Follow up in 1 week(s).   Specialty:  Internal Medicine Contact information: [address] [place] [address] [phone]        [facility] - Dme Follow up.   Why:  oxygen Contact information: [address] [place] [address] [phone]            Hospital Course by problem list:   COPD exacerbation secondary to influenza and coronavirus The patient presented with shortness of breath and nausea. BNP=30, troponin negative and no st or t wave changes on acs. Chest xray did not show any acute cardiopulmonary abnormalities. On exam, the patient had extensive wheezing and chest congestion. RVP was positive for coronavirus. Patient was reported to be positive for influenza per influenza pcr done by pcp in outpatient office prior to coming to hospital.   The patient was managed with breathing treatments, 5 days of prednisone, and 5 days of tamiflu. The patient improved throughout her hospitalization. She was discharged with breathing treatments to be used when needed and 3L oxygen to use when she ambulates. The patient will be followed with [facility] as she did not wish to go to a snf.   Discharge Vitals:   BP (!) 144/73 (BP Location: Right Arm)   Pulse 95   Temp 98.6 F (37 C) (Oral)   Resp 20   Ht 5\' 3"  (1.6 m)   Wt 208 lb 5.4 oz (94.5 kg)   SpO2 92%   BMI 36.90 kg/m   Pertinent Labs, Studies, and Procedures:  CBC Latest Ref Rng & Units 12/05/2017 12/03/2017 12/02/2017  WBC 4.0 - 10.5 K/uL 7.5 4.6 6.7  Hemoglobin 12.0 - 15.0 g/dL 32.4 40.1 02.7  Hematocrit 36.0 - 46.0 % 38.2 37.1 39.1  Platelets 150 - 400 K/uL 168 148(L) 156   BMP Latest Ref Rng & Units 12/06/2017 12/05/2017 12/04/2017  Glucose 65 - 99 mg/dL 253(G) 644(I) 347(Q)  BUN 6 - 20 mg/dL 17 15 17   Creatinine 0.44 - 1.00 mg/dL 2.59 5.63 8.75  Sodium 135 - 145 mmol/L 136 137 138  Potassium 3.5 - 5.1 mmol/L 3.9 3.6 3.4(L)  Chloride 101 - 111 mmol/L 101 101 104  CO2 22 - 32 mmol/L 24 26 25   Calcium 8.9 - 10.3 mg/dL 6.4(P) 3.2(R) 5.1(O)   Chest xray (12/02/17): Low lung volumes, otherwise no acute cardiopulmonary abnormalitiy    Discharge Instructions: Discharge Instructions    Call MD for:  difficulty breathing, headache or visual disturbances   Complete by:  As directed     Call MD for:  extreme fatigue   Complete by:  As directed    Call MD for:  persistant dizziness or light-headedness   Complete by:  As directed    Call MD for:  persistant nausea and vomiting   Complete by:  As directed    Call MD for:  redness, tenderness, or signs of infection (pain, swelling, redness, odor or green/yellow discharge around incision site)   Complete by:  As directed    Call MD for:  severe uncontrolled pain   Complete by:  As directed    Call MD for:  temperature >100.4   Complete by:  As directed    Diet - low sodium heart healthy  Complete by:  As directed    Increase activity slowly   Complete by:  As directed       Signed: Lorenso Courierhundi, Terance Pomplun, MD Pager: [phone]

## 2018-02-10 ENCOUNTER — Other Ambulatory Visit: Payer: Self-pay | Admitting: Internal Medicine

## 2018-02-10 DIAGNOSIS — R0602 Shortness of breath: Secondary | ICD-10-CM

## 2018-02-14 ENCOUNTER — Ambulatory Visit (INDEPENDENT_AMBULATORY_CARE_PROVIDER_SITE_OTHER): Payer: Medicare (Managed Care) | Admitting: Internal Medicine

## 2018-02-14 DIAGNOSIS — R0602 Shortness of breath: Secondary | ICD-10-CM

## 2018-02-14 LAB — PULMONARY FUNCTION TEST
DL/VA % PRED: 123 %
DL/VA: 5.49 ml/min/mmHg/L
DLCO unc % pred: 89 %
DLCO unc: 18.64 ml/min/mmHg
FEF 25-75 Post: 1.75 L/sec
FEF 25-75 Pre: 1.41 L/sec
FEF2575-%Change-Post: 24 %
FEF2575-%PRED-POST: 113 %
FEF2575-%Pred-Pre: 91 %
FEV1-%CHANGE-POST: 6 %
FEV1-%PRED-PRE: 84 %
FEV1-%Pred-Post: 89 %
FEV1-POST: 1.69 L
FEV1-PRE: 1.59 L
FEV1FVC-%CHANGE-POST: 2 %
FEV1FVC-%PRED-PRE: 104 %
FEV6-%Change-Post: 4 %
FEV6-%PRED-PRE: 84 %
FEV6-%Pred-Post: 87 %
FEV6-Post: 2.11 L
FEV6-Pre: 2.02 L
FEV6FVC-%Pred-Post: 105 %
FEV6FVC-%Pred-Pre: 105 %
FVC-%Change-Post: 4 %
FVC-%PRED-POST: 83 %
FVC-%PRED-PRE: 80 %
FVC-POST: 2.11 L
FVC-PRE: 2.02 L
PRE FEV1/FVC RATIO: 79 %
PRE FEV6/FVC RATIO: 100 %
Post FEV1/FVC ratio: 80 %
Post FEV6/FVC ratio: 100 %
RV % PRED: 110 %
RV: 2.38 L
TLC % pred: 93 %
TLC: 4.38 L

## 2018-02-14 NOTE — Progress Notes (Signed)
PFT done today. 

## 2018-07-04 ENCOUNTER — Ambulatory Visit
Admission: RE | Admit: 2018-07-04 | Discharge: 2018-07-04 | Disposition: A | Discharge status: Home / Self Care | Payer: Medicare (Managed Care) | Source: Ambulatory Visit | Attending: Nurse Practitioner | Admitting: Nurse Practitioner

## 2018-07-04 ENCOUNTER — Other Ambulatory Visit: Payer: Self-pay | Admitting: Nurse Practitioner

## 2018-07-04 DIAGNOSIS — R52 Pain, unspecified: Secondary | ICD-10-CM

## 2018-11-02 ENCOUNTER — Emergency Department (HOSPITAL_COMMUNITY): Payer: Medicare (Managed Care)

## 2018-11-02 ENCOUNTER — Inpatient Hospital Stay (HOSPITAL_COMMUNITY)
Admission: EM | Admit: 2018-11-02 | Discharge: 2018-11-14 | DRG: 981 | Disposition: A | Discharge status: Skilled Nursing Facility | Payer: Medicare (Managed Care) | Attending: Internal Medicine | Admitting: Internal Medicine

## 2018-11-02 DIAGNOSIS — Z9889 Other specified postprocedural states: Secondary | ICD-10-CM | POA: Diagnosis not present

## 2018-11-02 DIAGNOSIS — B372 Candidiasis of skin and nail: Secondary | ICD-10-CM | POA: Diagnosis present

## 2018-11-02 DIAGNOSIS — N179 Acute kidney failure, unspecified: Secondary | ICD-10-CM | POA: Diagnosis present

## 2018-11-02 DIAGNOSIS — Z6835 Body mass index (BMI) 35.0-35.9, adult: Secondary | ICD-10-CM | POA: Diagnosis not present

## 2018-11-02 DIAGNOSIS — E860 Dehydration: Secondary | ICD-10-CM | POA: Diagnosis present

## 2018-11-02 DIAGNOSIS — Z7722 Contact with and (suspected) exposure to environmental tobacco smoke (acute) (chronic): Secondary | ICD-10-CM | POA: Diagnosis present

## 2018-11-02 DIAGNOSIS — E1151 Type 2 diabetes mellitus with diabetic peripheral angiopathy without gangrene: Secondary | ICD-10-CM | POA: Diagnosis present

## 2018-11-02 DIAGNOSIS — E669 Obesity, unspecified: Secondary | ICD-10-CM | POA: Diagnosis present

## 2018-11-02 DIAGNOSIS — I10 Essential (primary) hypertension: Secondary | ICD-10-CM | POA: Diagnosis present

## 2018-11-02 DIAGNOSIS — M1990 Unspecified osteoarthritis, unspecified site: Secondary | ICD-10-CM | POA: Diagnosis present

## 2018-11-02 DIAGNOSIS — I959 Hypotension, unspecified: Secondary | ICD-10-CM | POA: Diagnosis present

## 2018-11-02 DIAGNOSIS — Z794 Long term (current) use of insulin: Secondary | ICD-10-CM | POA: Diagnosis not present

## 2018-11-02 DIAGNOSIS — M79A22 Nontraumatic compartment syndrome of left lower extremity: Secondary | ICD-10-CM | POA: Diagnosis not present

## 2018-11-02 DIAGNOSIS — I739 Peripheral vascular disease, unspecified: Secondary | ICD-10-CM | POA: Diagnosis not present

## 2018-11-02 DIAGNOSIS — M79609 Pain in unspecified limb: Secondary | ICD-10-CM | POA: Diagnosis not present

## 2018-11-02 DIAGNOSIS — R739 Hyperglycemia, unspecified: Secondary | ICD-10-CM | POA: Diagnosis not present

## 2018-11-02 DIAGNOSIS — E1110 Type 2 diabetes mellitus with ketoacidosis without coma: Secondary | ICD-10-CM | POA: Diagnosis present

## 2018-11-02 DIAGNOSIS — Z9862 Peripheral vascular angioplasty status: Secondary | ICD-10-CM | POA: Diagnosis not present

## 2018-11-02 DIAGNOSIS — I82432 Acute embolism and thrombosis of left popliteal vein: Secondary | ICD-10-CM | POA: Diagnosis not present

## 2018-11-02 DIAGNOSIS — Z7901 Long term (current) use of anticoagulants: Secondary | ICD-10-CM

## 2018-11-02 DIAGNOSIS — D649 Anemia, unspecified: Secondary | ICD-10-CM | POA: Diagnosis not present

## 2018-11-02 DIAGNOSIS — Z23 Encounter for immunization: Secondary | ICD-10-CM

## 2018-11-02 DIAGNOSIS — R011 Cardiac murmur, unspecified: Secondary | ICD-10-CM | POA: Diagnosis not present

## 2018-11-02 DIAGNOSIS — R195 Other fecal abnormalities: Secondary | ICD-10-CM | POA: Diagnosis not present

## 2018-11-02 DIAGNOSIS — Z86718 Personal history of other venous thrombosis and embolism: Secondary | ICD-10-CM | POA: Diagnosis not present

## 2018-11-02 DIAGNOSIS — I70213 Atherosclerosis of native arteries of extremities with intermittent claudication, bilateral legs: Secondary | ICD-10-CM | POA: Diagnosis not present

## 2018-11-02 DIAGNOSIS — I743 Embolism and thrombosis of arteries of the lower extremities: Secondary | ICD-10-CM | POA: Diagnosis present

## 2018-11-02 DIAGNOSIS — M797 Fibromyalgia: Secondary | ICD-10-CM | POA: Diagnosis not present

## 2018-11-02 DIAGNOSIS — E119 Type 2 diabetes mellitus without complications: Secondary | ICD-10-CM | POA: Diagnosis not present

## 2018-11-02 DIAGNOSIS — E118 Type 2 diabetes mellitus with unspecified complications: Secondary | ICD-10-CM | POA: Diagnosis not present

## 2018-11-02 DIAGNOSIS — Z79899 Other long term (current) drug therapy: Secondary | ICD-10-CM

## 2018-11-02 DIAGNOSIS — K529 Noninfective gastroenteritis and colitis, unspecified: Secondary | ICD-10-CM | POA: Diagnosis present

## 2018-11-02 DIAGNOSIS — Z888 Allergy status to other drugs, medicaments and biological substances status: Secondary | ICD-10-CM | POA: Diagnosis not present

## 2018-11-02 DIAGNOSIS — D62 Acute posthemorrhagic anemia: Secondary | ICD-10-CM | POA: Diagnosis not present

## 2018-11-02 DIAGNOSIS — J449 Chronic obstructive pulmonary disease, unspecified: Secondary | ICD-10-CM | POA: Diagnosis present

## 2018-11-02 DIAGNOSIS — K922 Gastrointestinal hemorrhage, unspecified: Secondary | ICD-10-CM | POA: Diagnosis present

## 2018-11-02 DIAGNOSIS — I82412 Acute embolism and thrombosis of left femoral vein: Secondary | ICD-10-CM | POA: Diagnosis not present

## 2018-11-02 DIAGNOSIS — R197 Diarrhea, unspecified: Secondary | ICD-10-CM | POA: Diagnosis present

## 2018-11-02 DIAGNOSIS — Z9114 Patient's other noncompliance with medication regimen: Secondary | ICD-10-CM | POA: Diagnosis not present

## 2018-11-02 LAB — CBC WITH DIFFERENTIAL/PLATELET
Abs Immature Granulocytes: 0.25 10*3/uL — ABNORMAL HIGH (ref 0.00–0.07)
Basophils Absolute: 0.1 10*3/uL (ref 0.0–0.1)
Basophils Relative: 0 %
EOS ABS: 0.5 10*3/uL (ref 0.0–0.5)
Eosinophils Relative: 2 %
HEMATOCRIT: 47.6 % — AB (ref 36.0–46.0)
Hemoglobin: 15.9 g/dL — ABNORMAL HIGH (ref 12.0–15.0)
Immature Granulocytes: 1 %
LYMPHS ABS: 3.4 10*3/uL (ref 0.7–4.0)
Lymphocytes Relative: 13 %
MCH: 30.1 pg (ref 26.0–34.0)
MCHC: 33.4 g/dL (ref 30.0–36.0)
MCV: 90 fL (ref 80.0–100.0)
Monocytes Absolute: 1.8 10*3/uL — ABNORMAL HIGH (ref 0.1–1.0)
Monocytes Relative: 7 %
Neutro Abs: 20.3 10*3/uL — ABNORMAL HIGH (ref 1.7–7.7)
Neutrophils Relative %: 77 %
Platelets: 248 10*3/uL (ref 150–400)
RBC: 5.29 MIL/uL — ABNORMAL HIGH (ref 3.87–5.11)
RDW: 12.4 % (ref 11.5–15.5)
WBC: 26.3 10*3/uL — ABNORMAL HIGH (ref 4.0–10.5)
nRBC: 0 % (ref 0.0–0.2)

## 2018-11-02 LAB — COMPREHENSIVE METABOLIC PANEL
ALT: 17 U/L (ref 0–44)
AST: 20 U/L (ref 15–41)
Albumin: 2.3 g/dL — ABNORMAL LOW (ref 3.5–5.0)
Alkaline Phosphatase: 32 U/L — ABNORMAL LOW (ref 38–126)
Anion gap: 15 (ref 5–15)
BUN: 48 mg/dL — ABNORMAL HIGH (ref 8–23)
CO2: 17 mmol/L — ABNORMAL LOW (ref 22–32)
Calcium: 7.5 mg/dL — ABNORMAL LOW (ref 8.9–10.3)
Chloride: 101 mmol/L (ref 98–111)
Creatinine, Ser: 2.2 mg/dL — ABNORMAL HIGH (ref 0.44–1.00)
GFR calc Af Amer: 25 mL/min — ABNORMAL LOW (ref >60)
GFR calc non Af Amer: 21 mL/min — ABNORMAL LOW (ref >60)
GLUCOSE: 511 mg/dL — AB (ref 70–99)
Potassium: 5 mmol/L (ref 3.5–5.1)
Sodium: 133 mmol/L — ABNORMAL LOW (ref 135–145)
Total Bilirubin: 0.3 mg/dL (ref 0.3–1.2)
Total Protein: 4.8 g/dL — ABNORMAL LOW (ref 6.5–8.1)

## 2018-11-02 LAB — CBG MONITORING, ED: Glucose-Capillary: 384 mg/dL — ABNORMAL HIGH (ref 70–99)

## 2018-11-02 LAB — CBC
HCT: 42.1 % (ref 36.0–46.0)
Hemoglobin: 13.7 g/dL (ref 12.0–15.0)
MCH: 29.9 pg (ref 26.0–34.0)
MCHC: 32.5 g/dL (ref 30.0–36.0)
MCV: 91.9 fL (ref 80.0–100.0)
Platelets: 165 10*3/uL (ref 150–400)
RBC: 4.58 MIL/uL (ref 3.87–5.11)
RDW: 12.6 % (ref 11.5–15.5)
WBC: 19.8 10*3/uL — ABNORMAL HIGH (ref 4.0–10.5)
nRBC: 0 % (ref 0.0–0.2)

## 2018-11-02 LAB — TSH: TSH: 2.068 u[IU]/mL (ref 0.350–4.500)

## 2018-11-02 LAB — PROTIME-INR
INR: 1.22
INR: 1.15
Prothrombin Time: 15.3 seconds — ABNORMAL HIGH (ref 11.4–15.2)
Prothrombin Time: >90 seconds — ABNORMAL HIGH (ref 11.4–15.2)
Prothrombin Time: 14.6 seconds (ref 11.4–15.2)

## 2018-11-02 LAB — TYPE AND SCREEN
ABO/RH(D): B POS
Antibody Screen: NEGATIVE

## 2018-11-02 LAB — C DIFFICILE QUICK SCREEN W PCR REFLEX
C Diff antigen: NEGATIVE
C Diff interpretation: NOT DETECTED
C Diff toxin: NEGATIVE

## 2018-11-02 LAB — LACTIC ACID, PLASMA
Lactic Acid, Venous: 5.2 mmol/L (ref 0.5–1.9)
Lactic Acid, Venous: 3.6 mmol/L (ref 0.5–1.9)

## 2018-11-02 LAB — POC OCCULT BLOOD, ED: Fecal Occult Bld: POSITIVE — AB

## 2018-11-02 LAB — MAGNESIUM: Magnesium: 1.7 mg/dL (ref 1.7–2.4)

## 2018-11-02 LAB — ABO/RH: ABO/RH(D): B POS

## 2018-11-02 LAB — HEMOGLOBIN A1C
Hgb A1c MFr Bld: 11.5 % — ABNORMAL HIGH (ref 4.8–5.6)
Mean Plasma Glucose: 283.35 mg/dL

## 2018-11-02 LAB — APTT: aPTT: 129 seconds — ABNORMAL HIGH (ref 24–36)

## 2018-11-02 LAB — PHOSPHORUS: Phosphorus: 5 mg/dL — ABNORMAL HIGH (ref 2.5–4.6)

## 2018-11-02 LAB — GLUCOSE, CAPILLARY: GLUCOSE-CAPILLARY: 182 mg/dL — AB (ref 70–99)

## 2018-11-02 MED ORDER — NYSTATIN 100000 UNIT/GM EX POWD
Freq: Two times a day (BID) | CUTANEOUS | Status: DC
  Administered 2018-11-02 – 2018-11-14 (×22): via TOPICAL
  Filled 2018-11-02 (×2): qty 15

## 2018-11-02 MED ORDER — RAMELTEON 8 MG PO TABS
8.0000 mg | ORAL_TABLET | Freq: Every day | ORAL | Status: DC
  Administered 2018-11-02 – 2018-11-13 (×11): 8 mg via ORAL
  Filled 2018-11-02 (×14): qty 1

## 2018-11-02 MED ORDER — ACETAMINOPHEN 650 MG RE SUPP: 650.0000 mg | Freq: Four times a day (QID) | RECTAL | Status: DC | PRN

## 2018-11-02 MED ORDER — INSULIN ASPART 100 UNIT/ML ~~LOC~~ SOLN
10.0000 [IU] | Freq: Once | SUBCUTANEOUS | Status: AC
  Administered 2018-11-02: 10 [IU] via INTRAVENOUS

## 2018-11-02 MED ORDER — SODIUM CHLORIDE 0.9 % IV BOLUS
1000.0000 mL | Freq: Once | INTRAVENOUS | Status: AC
  Administered 2018-11-02: 1000 mL via INTRAVENOUS

## 2018-11-02 MED ORDER — MORPHINE SULFATE (PF) 4 MG/ML IV SOLN
4.0000 mg | Freq: Once | INTRAVENOUS | Status: AC
  Administered 2018-11-02: 4 mg via INTRAVENOUS
  Filled 2018-11-02: qty 1

## 2018-11-02 MED ORDER — INSULIN ASPART 100 UNIT/ML ~~LOC~~ SOLN: 10.0000 [IU] | Freq: Once | SUBCUTANEOUS | Status: DC

## 2018-11-02 MED ORDER — ONDANSETRON HCL 4 MG/2ML IJ SOLN
4.0000 mg | Freq: Four times a day (QID) | INTRAMUSCULAR | Status: DC | PRN
  Administered 2018-11-03 – 2018-11-05 (×5): 4 mg via INTRAVENOUS
  Filled 2018-11-02 (×5): qty 2

## 2018-11-02 MED ORDER — ROSUVASTATIN CALCIUM 20 MG PO TABS
20.0000 mg | ORAL_TABLET | Freq: Every day | ORAL | Status: DC
  Administered 2018-11-02 – 2018-11-13 (×11): 20 mg via ORAL
  Filled 2018-11-02 (×11): qty 1

## 2018-11-02 MED ORDER — ACETAMINOPHEN 325 MG PO TABS
650.0000 mg | ORAL_TABLET | Freq: Four times a day (QID) | ORAL | Status: DC
  Administered 2018-11-02 – 2018-11-14 (×36): 650 mg via ORAL
  Filled 2018-11-02 (×38): qty 2

## 2018-11-02 MED ORDER — ACETAMINOPHEN 650 MG RE SUPP
650.0000 mg | Freq: Four times a day (QID) | RECTAL | Status: DC
  Filled 2018-11-02: qty 1

## 2018-11-02 MED ORDER — ONDANSETRON HCL 4 MG/2ML IJ SOLN
4.0000 mg | Freq: Once | INTRAMUSCULAR | Status: AC
  Administered 2018-11-02: 4 mg via INTRAVENOUS
  Filled 2018-11-02: qty 2

## 2018-11-02 MED ORDER — INSULIN GLARGINE 100 UNIT/ML ~~LOC~~ SOLN
35.0000 [IU] | Freq: Every day | SUBCUTANEOUS | Status: DC
  Administered 2018-11-02 – 2018-11-04 (×3): 35 [IU] via SUBCUTANEOUS
  Filled 2018-11-02 (×3): qty 0.35

## 2018-11-02 MED ORDER — PNEUMOCOCCAL VAC POLYVALENT 25 MCG/0.5ML IJ INJ
0.5000 mL | INJECTION | INTRAMUSCULAR | Status: AC
  Administered 2018-11-03: 0.5 mL via INTRAMUSCULAR
  Filled 2018-11-02: qty 0.5

## 2018-11-02 MED ORDER — ACETAMINOPHEN 325 MG PO TABS: 650.0000 mg | ORAL_TABLET | Freq: Four times a day (QID) | ORAL | Status: DC | PRN

## 2018-11-02 MED ORDER — INFLUENZA VAC SPLIT HIGH-DOSE 0.5 ML IM SUSY
0.5000 mL | PREFILLED_SYRINGE | INTRAMUSCULAR | Status: AC
  Administered 2018-11-03: 0.5 mL via INTRAMUSCULAR
  Filled 2018-11-02: qty 0.5

## 2018-11-02 MED ORDER — ONDANSETRON HCL 4 MG PO TABS: 4.0000 mg | ORAL_TABLET | Freq: Four times a day (QID) | ORAL | Status: DC | PRN

## 2018-11-02 MED ORDER — PIPERACILLIN-TAZOBACTAM 3.375 G IVPB 30 MIN
3.3750 g | Freq: Once | INTRAVENOUS | Status: AC
  Administered 2018-11-02: 3.375 g via INTRAVENOUS
  Filled 2018-11-02: qty 50

## 2018-11-02 MED ORDER — DULOXETINE HCL 60 MG PO CPEP
60.0000 mg | ORAL_CAPSULE | Freq: Every day | ORAL | Status: DC
  Administered 2018-11-03 – 2018-11-14 (×12): 60 mg via ORAL
  Filled 2018-11-02 (×12): qty 1

## 2018-11-02 MED ORDER — SODIUM CHLORIDE 0.9 % IV SOLN
INTRAVENOUS | Status: DC
  Administered 2018-11-02 – 2018-11-03 (×3): via INTRAVENOUS

## 2018-11-02 MED ORDER — INSULIN ASPART 100 UNIT/ML ~~LOC~~ SOLN
0.0000 [IU] | Freq: Three times a day (TID) | SUBCUTANEOUS | Status: DC
  Administered 2018-11-03 – 2018-11-05 (×4): 2 [IU] via SUBCUTANEOUS
  Administered 2018-11-05 – 2018-11-06 (×2): 3 [IU] via SUBCUTANEOUS
  Administered 2018-11-06: 5 [IU] via SUBCUTANEOUS
  Administered 2018-11-07 – 2018-11-09 (×5): 2 [IU] via SUBCUTANEOUS
  Administered 2018-11-10 – 2018-11-11 (×3): 5 [IU] via SUBCUTANEOUS
  Administered 2018-11-11 – 2018-11-12 (×4): 3 [IU] via SUBCUTANEOUS
  Administered 2018-11-13 – 2018-11-14 (×4): 2 [IU] via SUBCUTANEOUS

## 2018-11-02 MED ORDER — IPRATROPIUM-ALBUTEROL 0.5-2.5 (3) MG/3ML IN SOLN: 3.0000 mL | Freq: Four times a day (QID) | RESPIRATORY_TRACT | Status: DC | PRN

## 2018-11-02 MED ORDER — INSULIN ASPART 100 UNIT/ML ~~LOC~~ SOLN
0.0000 [IU] | Freq: Every day | SUBCUTANEOUS | Status: DC
  Administered 2018-11-04: 2 [IU] via SUBCUTANEOUS
  Administered 2018-11-08: 3 [IU] via SUBCUTANEOUS
  Administered 2018-11-13: 2 [IU] via SUBCUTANEOUS

## 2018-11-02 MED ORDER — INSULIN ASPART 100 UNIT/ML ~~LOC~~ SOLN
6.0000 [IU] | Freq: Three times a day (TID) | SUBCUTANEOUS | Status: DC
  Administered 2018-11-03 – 2018-11-08 (×15): 6 [IU] via SUBCUTANEOUS

## 2018-11-02 MED ORDER — FENTANYL CITRATE (PF) 100 MCG/2ML IJ SOLN
25.0000 ug | INTRAMUSCULAR | Status: DC | PRN
  Administered 2018-11-02 – 2018-11-05 (×10): 25 ug via INTRAVENOUS
  Administered 2018-11-06 – 2018-11-09 (×11): 50 ug via INTRAVENOUS
  Administered 2018-11-12: 25 ug via INTRAVENOUS
  Administered 2018-11-13: 50 ug via INTRAVENOUS
  Filled 2018-11-02 (×26): qty 2

## 2018-11-02 NOTE — H&P (Signed)
Date: 11/02/2018               Patient Name:  Gabriella [name] MRN: [id number]  DOB: December 07, 1940 Age / Sex: 78 y.o., female   PCP: Miguel AschoffWilliams, Julie Anne, MD         Medical Service: Internal Medicine Teaching Service         Attending Physician: Dr. Inez CatalinaMullen, Emily B, MD    First Contact: Dr. Gwyneth RevelsKrienke Pager: [phone]  Second Contact: Dr. Frances FurbishWinfrey Pager: [phone]       After Hours (After 5p/  First Contact Pager: [phone]  weekends / holidays): Second Contact Pager: [phone]   Chief Complaint: Abdominal pain and bloody stool  History of Present Illness: This is a 78 year old female with a history of hypertension, diabetes, COPD and arthritis who [facility] where she was found to have diarrhea that was guaiac +, she also has been having abdominal pain and feeling generally weak. She was given 1L while there and [facility].  She reports that last night she was getting up to go to the bathroom and was unable to hold her herself up and she fell.  She became very dizzy and weak and had to crawl towards the bedroom door to get help.  Her daughter found her in the car to [facility] the next morning.  She states that she has been having abdominal pain and nausea since this morning, no vomiting. Abdominal pain is located in the epigastrium, is a dull/aching pain, the nausea medications help. She has also been having some diarrhea since last night however she states that this has been going on for months and it comes and goes, she reports that she has been taking constipation medications that she got from [facility] but that she does not have constipation.  She states that her daughter has also been sick with a GI issue.  Denies any change in her diet or recent travel history.  She denies any fevers, chills, skin wounds, joint pains, or shortness of breath.  She does go to [facility] on Tuesdays, Wednesdays and Fridays.  In terms of her diabetes she states that she takes 50 units of insulin daily and metformin, she  does not always take her insulin.  For the past 2 to 3 days she has been taking it.  In the ED patient was found to be afebrile, tachypneic, tachycardic and mildly hypotensive.  Labs are significant for a anion gap metabolic acidosis, creatinine 2.2, baseline 0.8, elevated lactic acid of 5.2, and leukocytosis of 26.  Chest x-ray showed mildly increased vascular congestion, no areas of consolidation. GI panel and C diff pcr was ordered. Admitted to internal medicine.   Meds:  Current Meds  Medication Sig  . acetaminophen (TYLENOL) 650 MG CR tablet Take 650 mg by mouth See admin instructions. Take 650 mg by mouth two times a day and an additional 650 mg at midday as needed for breakthrough pain  . albuterol (PROVENTIL HFA) 108 (90 Base) MCG/ACT inhaler Inhale 2 puffs into the lungs every 6 (six) hours as needed for wheezing or shortness of breath.   Marland Kitchen. amLODipine (NORVASC) 10 MG tablet Take 10 mg by mouth daily.  . Artificial Saliva (BIOTENE DRY MOUTH MOISTURIZING) SOLN 1 spray by Mouth Rinse route See admin instructions. Spray in the mouth 6 times a day  . chlorhexidine (PERIDEX) 0.12 % solution 5 mLs See admin instructions. Brush 1 teaspoonful of solution onto teeth and gums after PM  mouth care/spit out excess and do not rinse  . Cholecalciferol (VITAMIN D3) 1.25 MG (50000 UT) CAPS Take 50,000 Units by mouth every Sunday.  Marland Kitchen Dextran 70-Hypromellose (ARTIFICIAL TEARS PF OP) Place 1 drop into both eyes 2 (two) times daily.  . diphenhydramine-acetaminophen (TYLENOL PM) 25-500 MG TABS tablet Take 2 tablets by mouth at bedtime.   . DULoxetine (CYMBALTA) 60 MG capsule Take 60 mg by mouth daily.  . Fluticasone-Salmeterol (ADVAIR DISKUS) 250-50 MCG/DOSE AEPB Inhale 1 puff into the lungs 2 (two) times daily.  Marland Kitchen gabapentin (NEURONTIN) 600 MG tablet Take 600 mg by mouth 2 (two) times daily.   . insulin aspart (NOVOLOG FLEXPEN) 100 UNIT/ML FlexPen Inject 10 Units into the skin once as needed (for a BGL of 504).    . Insulin Glargine (BASAGLAR KWIKPEN) 100 UNIT/ML SOPN Inject 50 Units into the skin at bedtime.   Marland Kitchen ipratropium-albuterol (DUONEB) 0.5-2.5 (3) MG/3ML SOLN Take 3 mLs by nebulization every 4 (four) hours as needed. (Patient taking differently: Take 3 mLs by nebulization every 4 (four) hours as needed (for wheezing or shortness of breath). )  . lisinopril (PRINIVIL,ZESTRIL) 30 MG tablet Take 30 mg by mouth daily.  . Melatonin 10 MG CAPS Take 10 mg by mouth at bedtime.  . Menthol, Topical Analgesic, (BIOFREEZE EX) Apply 1 application topically See admin instructions. Apply to affected areas 2 times a day for pain  . metFORMIN (GLUCOPHAGE) 1000 MG tablet Take 1,000 mg by mouth 2 (two) times daily with a meal.  . miconazole (MICRO GUARD) 2 % powder Apply 1 application topically See admin instructions. Apply to skin folds and perineum 2 times a day  . mineral oil-hydrophilic petrolatum (AQUAPHOR) ointment Apply 1 application topically See admin instructions. Apply to dry skin daily and after bathing  . rOPINIRole (REQUIP) 2 MG tablet Take 2 mg by mouth at bedtime.  . rosuvastatin (CRESTOR) 20 MG tablet Take 20 mg by mouth at bedtime.  . sennosides-docusate sodium (SENOKOT-S) 8.6-50 MG tablet Take 2 tablets by mouth at bedtime.  . traZODone (DESYREL) 50 MG tablet Take 50 mg by mouth at bedtime.  . Turmeric 500 MG CAPS Take 500 mg by mouth 2 (two) times daily.     Allergies: Allergies as of 11/02/2018 - Review Complete 11/02/2018  Allergen Reaction Noted  . Other Diarrhea, Nausea And Vomiting, Anxiety, and Other (See Comments) 10/07/2009   Past Medical History:  Diagnosis Date  . Arthritis   . COPD (chronic obstructive pulmonary disease) (HCC)   . Diabetes mellitus without complication (HCC)   . Hypertension     Family History: No significant family history.   Social History: Denies any smoking, EtOH or drug use. 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 Lives with her daughter and the daughters fiance.   Review of Systems: A complete ROS was negative except as per HPI.   Physical Exam: Blood pressure (!) 104/56, pulse 96, temperature 97.7 F (36.5 C), temperature source Oral, resp. rate (!) 23, SpO2 97 %. Physical Exam  Constitutional: She is oriented to person, place, and time.  Obese female, NAD, laying in bed  HENT:  Head: Normocephalic and atraumatic.  Eyes: Pupils are equal, round, and reactive to light. Conjunctivae and EOM are normal.  Right eyelid droop  Neck: Normal range of motion. Neck supple. No thyromegaly present.  Cardiovascular: Normal rate, regular rhythm and normal heart sounds.  Pulmonary/Chest: Effort normal and breath sounds normal. No  respiratory distress.  Abdominal:  Hypoactive bowel sounds, minimal TTP in epigastric area, non-distended, soft  Musculoskeletal: Normal range of motion.        General: No edema.  Neurological: She is alert and oriented to person, place, and time.  Skin: Skin is warm and dry. Rash (Erythematous rash in hip creases) noted.  Psychiatric: Mood and affect normal.    CXR: personally reviewed my interpretation is no areas of consolidation, no cardiomegaly, mildly increased vascular congestion in right hilar area consistent with prior x-rays.   Assessment & Plan by Problem: Active Problems:   Gastroenteritis  This is a 78 year old female with a history of diabetes, hypertension, COPD and arthritis who presented with abdominal pain, nausea, diarrhea.  She did have a sick contact with similar symptoms and [facility].    Abdominal pain 2/2 bacterial gastroenteritis vs DKA: Sepsis  -On exam she did have some mild tenderness in the epigastric area, and hypoactive bowel sounds. Found to have tachypnea, tachycardia and hypotensive.  Found to have a leukocytosis of 26, elevated lactic acid 5.2, elevated CBG to 511, and anion gap metabolic acidosis.  She is also found to have  an AKI.  Chest x-ray showed no acute findings.  CT abdomen pelvis showed mild diffuse small bowel thickening, possible small bowel enteritis. Given the findings it is possible that could be related to an infectious cause such as a bacterial gastroenteritis, c diff. She was not having any coughing or issues with her urination. Skin exam showed no acute wounds. This also may be related to her hyperglycemia with a possible DKA, however she appears relatively stable and she has had very good response to the insulin. Her electrolytes were normal. -S/p 1 dose of azithromycin -F/u  blood cultures -F/u Urinalysis  -F/u GI panel -S/p 2L NS bolus, continue IV fluids at 125 cc/hr -Trend lactic acid -Zofran PRN -Check TSH -CBC and BMP in AM -Tylenol PRN  Diabetes: Hyperglycemia, ?DKA: Anion gap metabolic acidosis: -She has a history of diabetes and takes insulin 50 units daily, she states that she taken it for the past few days.  On arrival she was found to have hyperglycemia to 511.  She also has a anion gap metabolic acidosis could be related to elevated ketones.  However she did have good response to the 10 units of insulin in vitals have improved.  We will obtain urine analysis and trend BMP.  -U/A -BMP in AM -Check A1c -10 units novolog now, recheck CBG  -Lantus 35 units daily -Frequent CBGs -May consider insulin drip if CBGs don't improve   AKI: Cr elevated to 2.2 on admission, baseline of aroun 0.8. She has been having diarrhea and vomiting, this is likely 2/2 pre-renal azotemia and she is receiving fluids at this time.  -Repeat BMP in AM   COPD: -She is on albuterol, Advair and DuoNeb's at home.   -Duonebs PRN  Candidal intertrigo: -Seen on skin exam, she does take miconazole at home. -Nystatin powder  FEN: NS 125cc/hr, replete lytes prn, heart healthy diet  VTE ppx: SCDs Code Status: FULL    Dispo: Admit patient to Inpatient with expected length of stay greater than 2  midnights.  Signed: Claudean Severance, MD 11/02/2018, 6:14 PM  Pager: [phone]

## 2018-11-02 NOTE — ED Triage Notes (Signed)
[facility] lives at [facility]. Been having diarr and weak. Daughter got her up when she was found. [facility] found her to be hypotensive and gave her 1L. Stool postitive for blood and possibly urine too. Insulin given at [facility]. Ems gave zofran. 24 left AC.

## 2018-11-02 NOTE — ED Provider Notes (Signed)
[facility] EM[facility] Provider Note   CSN: [id number] Arrival date & time: 11/02/18  1331     History   Chief Complaint Chief Complaint  Patient presents with  . GI Bleeding    HPI Gabriella [name] is a 78 y.o. female.  Pt presents to the ED today with blood in stool.  [facility] and [facility] noticed pt had diarrhea.  They checked it for blood and it was guaiac +.  The pt [facility].  The pt has been weak and has some abdominal pain.  She said she's never seen a GI doctor.  She is not on blood thinners.     Past Medical History:  Diagnosis Date  . Arthritis   . COPD (chronic obstructive pulmonary disease) (HCC)   . Diabetes mellitus without complication (HCC)   . Hypertension     Patient Active Problem List   Diagnosis Date Noted  . Pressure injury of skin 12/04/2017  . Diabetic ketoacidosis (HCC) 12/03/2017  . Acute respiratory failure with hypoxia (HCC) 12/02/2017  . Influenza B 12/02/2017    Past Surgical History:  Procedure Laterality Date  . CATARACT EXTRACTION Right 11/2017     OB History   No obstetric history on file.      Home Medications    Prior to Admission medications   Medication Sig Start Date End Date Taking? Authorizing Provider  acetaminophen (TYLENOL) 325 MG tablet Take 325 mg by mouth as needed.    [provider]  albuterol (PROVENTIL HFA) 108 (90 Base) MCG/ACT inhaler Inhale 2 puffs into the lungs as needed. 12/02/16   [provider]  amLODipine (NORVASC) 10 MG tablet Take 10 mg by mouth daily. 05/25/17   [provider]  budesonide-formoterol (SYMBICORT) 160-4.5 MCG/ACT inhaler Inhale 2 puffs into the lungs daily as needed. 12/02/16   [provider]  diphenhydramine-acetaminophen (TYLENOL PM) 25-500 MG TABS tablet Take 4 tablets by mouth at bedtime.    [provider]  DULoxetine (CYMBALTA) 60 MG capsule Take 60 mg by mouth daily. 12/02/16    [provider]  gabapentin (NEURONTIN) 600 MG tablet Take 600 mg by mouth 3 (three) times daily. 12/02/16   [provider]  insulin glargine (LANTUS) 100 UNIT/ML injection Inject 20 Units into the skin at bedtime.     [provider]  insulin NPH-regular Human (HUMULIN 70/30) (70-30) 100 UNIT/ML injection Inject 25 Units into the skin 2 (two) times daily. 09/16/16   [provider]  ipratropium-albuterol (DUONEB) 0.5-2.5 (3) MG/3ML SOLN Take 3 mLs by nebulization every 4 (four) hours as needed. 12/06/17 01/05/18  Lorenso Courierhundi, Vahini, MD  ketorolac (ACULAR) 0.5 % ophthalmic solution Place 1 drop into the right eye 4 (four) times daily. 11/29/17   [provider]  lisinopril (PRINIVIL,ZESTRIL) 20 MG tablet Take 20 mg by mouth daily. 01/03/17   [provider]  Melatonin 10 MG TABS Take 10 mg by mouth at bedtime.    [provider]  metFORMIN (GLUCOPHAGE-XR) 500 MG 24 hr tablet Take 500 mg by mouth 2 (two) times daily. 12/02/16   [provider]  nystatin (MYCOSTATIN/NYSTOP) powder Apply 1 application topically daily as needed. 11/20/14   [provider]  prednisoLONE acetate (PRED FORTE) 1 % ophthalmic suspension Place 1 drop into the right eye 4 (four) times daily. 11/15/17   [provider]  rosuvastatin (CRESTOR) 10 MG tablet Take 20 mg by mouth at bedtime.  05/25/17   [provider]  tiotropium (SPIRIVA) 18 MCG inhalation capsule Place 18 mcg into inhaler and inhale daily as needed. 12/09/15   [provider]  traZODone (DESYREL) 50 MG tablet Take 50 mg by mouth at bedtime. 09/01/17   [provider]  trimethoprim-polymyxin b (POLYTRIM) ophthalmic solution Place 1 drop into the right eye 4 (four) times daily. 11/15/17   [provider]    Family History No family history on file.  Social History Social History   Tobacco Use  . Smoking status: Passive Smoke Exposure - Never Smoker    . Smokeless tobacco: Never Used  Substance Use Topics  . Alcohol use: No    Frequency: Never  . Drug use: No     Allergies   Other   Review of Systems Review of Systems  Gastrointestinal: Positive for abdominal pain, blood in stool and diarrhea.  All other systems reviewed and are negative.    Physical Exam Updated Vital Signs BP (!) 104/56   Pulse 96   Temp 97.7 F (36.5 C) (Oral)   Resp (!) 23   SpO2 97%   Physical Exam Vitals signs and nursing note reviewed.  Constitutional:      Appearance: Normal appearance.  HENT:     Head: Normocephalic and atraumatic.     Right Ear: External ear normal.     Left Ear: External ear normal.     Nose: Nose normal.     Mouth/Throat:     Mouth: Mucous membranes are moist.  Eyes:     Extraocular Movements: Extraocular movements intact.     Conjunctiva/sclera: Conjunctivae normal.     Pupils: Pupils are equal, round, and reactive to light.  Neck:     Musculoskeletal: Normal range of motion.  Cardiovascular:     Rate and Rhythm: Normal rate and regular rhythm.     Pulses: Normal pulses.     Heart sounds: Normal heart sounds.  Pulmonary:     Effort: Pulmonary effort is normal.  Abdominal:     General: Abdomen is flat.     Tenderness: There is generalized abdominal tenderness.  Genitourinary:    Rectum: Guaiac result positive.     Comments: Stool is yellow.  NO gross blood. Musculoskeletal: Normal range of motion.  Skin:    General: Skin is warm.     Capillary Refill: Capillary refill takes less than 2 seconds.  Neurological:     General: No focal deficit present.     Mental Status: She is alert and oriented to person, place, and time.  Psychiatric:        Mood and Affect: Mood normal.        Behavior: Behavior normal.      ED Treatments / Results  Labs (all labs ordered are listed, but only abnormal results are displayed) Labs Reviewed  COMPREHENSIVE METABOLIC PANEL - Abnormal; Notable for the following  components:      Result Value   Sodium 133 (*)    CO2 17 (*)    Glucose, Bld 511 (*)    BUN 48 (*)    Creatinine, Ser 2.20 (*)    Calcium 7.5 (*)    Total Protein 4.8 (*)    Albumin 2.3 (*)    Alkaline Phosphatase 32 (*)    GFR calc non Af Amer 21 (*)    GFR calc Af Amer 25 (*)    All other components within normal limits  CBC WITH DIFFERENTIAL/PLATELET - Abnormal; Notable  for the following components:   WBC 26.3 (*)    RBC 5.29 (*)    Hemoglobin 15.9 (*)    HCT 47.6 (*)    Neutro Abs 20.3 (*)    Monocytes Absolute 1.8 (*)    Abs Immature Granulocytes 0.25 (*)    All other components within normal limits  PROTIME-INR - Abnormal; Notable for the following components:   Prothrombin Time 15.3 (*)    All other components within normal limits  LACTIC ACID, PLASMA - Abnormal; Notable for the following components:   Lactic Acid, Venous 5.2 (*)    All other components within normal limits  POC OCCULT BLOOD, ED - Abnormal; Notable for the following components:   Fecal Occult Bld POSITIVE (*)    All other components within normal limits  C DIFFICILE QUICK SCREEN W PCR REFLEX  GASTROINTESTINAL PANEL BY PCR, STOOL (REPLACES STOOL CULTURE)  URINALYSIS, ROUTINE W REFLEX MICROSCOPIC  CBG MONITORING, ED  TYPE AND SCREEN  ABO/RH       EKG None  Radiology Dg Chest Port 1 View  Result Date: 11/02/2018 CLINICAL DATA:  Gastrointestinal bleeding. EXAM: PORTABLE CHEST 1 VIEW COMPARISON:  Radiographs of December 02, 2017. FINDINGS: The heart size and mediastinal contours are within normal limits. Both lungs are clear. The visualized skeletal structures are unremarkable. IMPRESSION: No active disease. Electronically Signed   By: Lupita RaiderJames  Green Jr, M.D.   On: 11/02/2018 14:11    Procedures Procedures (including critical care time)  Medications Ordered in ED Medications  sodium chloride 0.9 % bolus 1,000 mL (1,000 mLs Intravenous New Bag/Given 11/02/18 1401)    And  0.9 %  sodium chloride  infusion (has no administration in time range)  sodium chloride 0.9 % bolus 1,000 mL (has no administration in time range)  piperacillin-tazobactam (ZOSYN) IVPB 3.375 g (3.375 g Intravenous New Bag/Given 11/02/18 1548)  ondansetron (ZOFRAN) injection 4 mg (4 mg Intravenous Given 11/02/18 1420)  insulin aspart (novoLOG) injection 10 Units (10 Units Intravenous Given 11/02/18 1534)  morphine 4 MG/ML injection 4 mg (4 mg Intravenous Given 11/02/18 1542)  ondansetron (ZOFRAN) injection 4 mg (4 mg Intravenous Given 11/02/18 1542)     Initial Impression / Assessment and Plan / ED Course  I have reviewed the triage vital signs and the nursing notes.  Pertinent labs & imaging results that were available during my care of the patient were reviewed by me and considered in my medical decision making (see chart for details).   BP soft, so pt given IVFs.  H/h ok.  WBC elevated with AKI.  Stool is yellow and not black.  No gross blood.  I suspect she has an infectious diarrhea or colitis.  She will be started on Zosyn IV.  No stool yet for cdiff or stool studies.  Pt is also hyperglycemic and so she was given IV insulin.    CRITICAL CARE Performed by: Jacalyn LefevreJulie Ariona Deschene   Total critical care time: 30 minutes  Critical care time was exclusive of separately billable procedures and treating other patients.  Critical care was necessary to treat or prevent imminent or life-threatening deterioration.  Critical care was time spent personally by me on the following activities: development of treatment plan with patient and/or surrogate as well as nursing, discussions with consultants, evaluation of patient's response to treatment, examination of patient, obtaining history from patient or surrogate, ordering and performing treatments and interventions, ordering and review of laboratory studies, ordering and review of radiographic studies, pulse oximetry and  re-evaluation of patient's condition.  Pt d/w IMTS for  admission.  Final Clinical Impressions(s) / ED Diagnoses   Final diagnoses:  GI bleed  AKI (acute kidney injury) (HCC)  Hyperglycemia  Diarrhea, unspecified type    ED Discharge Orders    None       Jacalyn Lefevre, MD 11/02/18 [phone]

## 2018-11-02 NOTE — ED Notes (Signed)
Rosey Bath, daughter, [phone]

## 2018-11-02 NOTE — ED Notes (Signed)
[phone] Gabriella [name]

## 2018-11-03 ENCOUNTER — Inpatient Hospital Stay (HOSPITAL_COMMUNITY): Payer: Medicare (Managed Care)

## 2018-11-03 ENCOUNTER — Other Ambulatory Visit: Payer: Self-pay

## 2018-11-03 DIAGNOSIS — N179 Acute kidney failure, unspecified: Secondary | ICD-10-CM

## 2018-11-03 DIAGNOSIS — I739 Peripheral vascular disease, unspecified: Secondary | ICD-10-CM

## 2018-11-03 DIAGNOSIS — R739 Hyperglycemia, unspecified: Secondary | ICD-10-CM | POA: Diagnosis present

## 2018-11-03 DIAGNOSIS — R197 Diarrhea, unspecified: Secondary | ICD-10-CM

## 2018-11-03 DIAGNOSIS — K529 Noninfective gastroenteritis and colitis, unspecified: Principal | ICD-10-CM

## 2018-11-03 LAB — GASTROINTESTINAL PANEL BY PCR, STOOL (REPLACES STOOL CULTURE)
ASTROVIRUS: NOT DETECTED
Adenovirus F40/41: NOT DETECTED
CYCLOSPORA CAYETANENSIS: NOT DETECTED
Campylobacter species: NOT DETECTED
Cryptosporidium: NOT DETECTED
Entamoeba histolytica: NOT DETECTED
Enteroaggregative E coli (EAEC): NOT DETECTED
Enteropathogenic E coli (EPEC): NOT DETECTED
Enterotoxigenic E coli (ETEC): NOT DETECTED
Giardia lamblia: NOT DETECTED
Norovirus GI/GII: NOT DETECTED
Plesimonas shigelloides: NOT DETECTED
Rotavirus A: NOT DETECTED
Salmonella species: NOT DETECTED
Sapovirus (I, II, IV, and V): NOT DETECTED
Shiga like toxin producing E coli (STEC): NOT DETECTED
Shigella/Enteroinvasive E coli (EIEC): NOT DETECTED
Vibrio cholerae: NOT DETECTED
Vibrio species: NOT DETECTED
Yersinia enterocolitica: NOT DETECTED

## 2018-11-03 LAB — BASIC METABOLIC PANEL
Anion gap: 9 (ref 5–15)
BUN: 45 mg/dL — ABNORMAL HIGH (ref 8–23)
CHLORIDE: 110 mmol/L (ref 98–111)
CO2: 18 mmol/L — ABNORMAL LOW (ref 22–32)
Calcium: 7.8 mg/dL — ABNORMAL LOW (ref 8.9–10.3)
Creatinine, Ser: 1.51 mg/dL — ABNORMAL HIGH (ref 0.44–1.00)
GFR calc Af Amer: 39 mL/min — ABNORMAL LOW (ref >60)
GFR calc non Af Amer: 33 mL/min — ABNORMAL LOW (ref >60)
Glucose, Bld: 99 mg/dL (ref 70–99)
POTASSIUM: 4.8 mmol/L (ref 3.5–5.1)
Sodium: 137 mmol/L (ref 135–145)

## 2018-11-03 LAB — GLUCOSE, CAPILLARY
GLUCOSE-CAPILLARY: 134 mg/dL — AB (ref 70–99)
Glucose-Capillary: 74 mg/dL (ref 70–99)
Glucose-Capillary: 131 mg/dL — ABNORMAL HIGH (ref 70–99)
Glucose-Capillary: 123 mg/dL — ABNORMAL HIGH (ref 70–99)

## 2018-11-03 LAB — CBC
HCT: 36.3 % (ref 36.0–46.0)
Hemoglobin: 11.9 g/dL — ABNORMAL LOW (ref 12.0–15.0)
MCH: 29.8 pg (ref 26.0–34.0)
MCHC: 32.8 g/dL (ref 30.0–36.0)
MCV: 90.8 fL (ref 80.0–100.0)
NRBC: 0 % (ref 0.0–0.2)
Platelets: 181 10*3/uL (ref 150–400)
RBC: 4 MIL/uL (ref 3.87–5.11)
RDW: 12.6 % (ref 11.5–15.5)
WBC: 14.8 10*3/uL — ABNORMAL HIGH (ref 4.0–10.5)

## 2018-11-03 LAB — MRSA PCR SCREENING: MRSA by PCR: NEGATIVE

## 2018-11-03 LAB — COMPREHENSIVE METABOLIC PANEL
ALT: 16 U/L (ref 0–44)
AST: 17 U/L (ref 15–41)
Albumin: 2.3 g/dL — ABNORMAL LOW (ref 3.5–5.0)
Alkaline Phosphatase: 26 U/L — ABNORMAL LOW (ref 38–126)
Anion gap: 13 (ref 5–15)
BUN: 48 mg/dL — ABNORMAL HIGH (ref 8–23)
CO2: 17 mmol/L — ABNORMAL LOW (ref 22–32)
Calcium: 7.8 mg/dL — ABNORMAL LOW (ref 8.9–10.3)
Chloride: 105 mmol/L (ref 98–111)
Creatinine, Ser: 1.73 mg/dL — ABNORMAL HIGH (ref 0.44–1.00)
GFR calc Af Amer: 33 mL/min — ABNORMAL LOW (ref >60)
GFR calc non Af Amer: 28 mL/min — ABNORMAL LOW (ref >60)
Glucose, Bld: 213 mg/dL — ABNORMAL HIGH (ref 70–99)
Potassium: 5 mmol/L (ref 3.5–5.1)
Sodium: 135 mmol/L (ref 135–145)
Total Bilirubin: 0.3 mg/dL (ref 0.3–1.2)
Total Protein: 4.7 g/dL — ABNORMAL LOW (ref 6.5–8.1)

## 2018-11-03 LAB — URINALYSIS, ROUTINE W REFLEX MICROSCOPIC
Bilirubin Urine: NEGATIVE
GLUCOSE, UA: NEGATIVE mg/dL
Hgb urine dipstick: NEGATIVE
Ketones, ur: NEGATIVE mg/dL
Nitrite: NEGATIVE
PROTEIN: NEGATIVE mg/dL
Specific Gravity, Urine: 1.017 (ref 1.005–1.030)
pH: 5 (ref 5.0–8.0)

## 2018-11-03 LAB — LACTIC ACID, PLASMA
Lactic Acid, Venous: 1.4 mmol/L (ref 0.5–1.9)
Lactic Acid, Venous: 2.3 mmol/L (ref 0.5–1.9)

## 2018-11-03 MED ORDER — LACTATED RINGERS IV SOLN
INTRAVENOUS | Status: DC
  Administered 2018-11-03 – 2018-11-07 (×8): via INTRAVENOUS

## 2018-11-03 MED ORDER — DICLOFENAC SODIUM 1 % TD GEL
2.0000 g | Freq: Four times a day (QID) | TRANSDERMAL | Status: DC | PRN
  Filled 2018-11-03: qty 100

## 2018-11-03 MED ORDER — GABAPENTIN 300 MG PO CAPS
300.0000 mg | ORAL_CAPSULE | Freq: Every day | ORAL | Status: DC
  Administered 2018-11-03 – 2018-11-04 (×2): 300 mg via ORAL
  Filled 2018-11-03 (×2): qty 1

## 2018-11-03 NOTE — Progress Notes (Signed)
CRITICAL VALUE ALERT  Critical Value:  Lactic Acid: 3.6 , PT >90 , INR >10  Date & Time Notied:  11/02/18 2214  Provider Notified: Dortha Schwalbe, MD 11/02/18 2215  Orders Received/Actions taken: PT/INR redraw ordered. Will continue to monitor.

## 2018-11-03 NOTE — Progress Notes (Signed)
  Date: 11/03/2018  Patient name: Gabriella [name]  Medical record number: [id number]  Date of birth: Jan 25, 1941   I have seen and evaluated Gabriella [name] and discussed their care with the Residency Team. Briefly, Gabriella [name] is a 78 year old woman with pMH of HTN, DM2, COPD, arthritis who presented with a diarrheal illness, guaiac + stool and abdominal pain.  She was dizzy and weak at home, sick contact in her daughter.  She was noted to have enteritis on CT scan.  Cdiff was negative.  She also reported severe left leg pain, chronic in nature, but also with cool extremities and ABIs were obtained.  She met sepsis criteria and had an elevated lactate and elevated WBC.  She was admitted for further work up.    Exam:   Vitals:   11/03/18 0404 11/03/18 1440  BP: (!) 110/50 (!) 132/57  Pulse: 77 (!) 104  Resp: 18 20  Temp: (!) 97.4 F (36.3 C) 98 F (36.7 C)  SpO2: 100% 97%   General: Lying in bed, complaining of leg pain, alert and oriented Eyes: Anicteric sclerae, no injection, chronic right eyelid droop HENT: Neck is supple CV: RR, NR, no murmur Pulm: Breathing comfortably, no wheezing Abd: Hyperactive bowel sounds, mild tenderness throughout, ND Ext: Cold foot, left > right.  Very thready pulses in DP bilaterally (very difficult to palpate anything in the left foot).  TTP over calf, but no swelling or erythema.  Skin: Chronic skin changes to face, no wounds or calluses on feet/legs.  Rash on groin creases, red and patchy.  Psych: Mood normal, oriented to place and time.   Assessment and Plan: I have seen and evaluated the patient as outlined above. I agree with the formulated Assessment and Plan as detailed in the residents' note, with the following changes:   1. Enteritis, septic shock, lactic acidosis - Likely GI in nature, GIP panel and Cdiff negative.  Possibly viral in nature, hold abx for now - F/U cultures - Trend lactic acid - Continue fluids for BP support and sepsis - Tylenol PRN  for pain  2. DM 2, likely mild DKA vs. Lactic acidosis causing an anion gap metabolic acidosis - Check UA for ketones - Insulin as noted was given in the ED and glucose improved - SSI, lantus - Monitor for hyperglycemia once she begins eating again.   3. AKI - Improving with fluids - Continue IVF  4. Left leg pain, cool extremity - Check ABIs  Other issues per Dr. Dorothyann Peng note.   Sid Falcon, MD 2/14/20208:01 PM

## 2018-11-03 NOTE — Plan of Care (Signed)

## 2018-11-03 NOTE — Discharge Summary (Signed)
Name: Gabriella [name] MRN: [id number] DOB: 1941/07/14 78 y.o. PCP: Miguel Aschoff, MD  Date of Admission: 11/02/2018  1:31 PM Date of Discharge: 11/14/18 Attending Physician: Earl Lagos, MD  Discharge Diagnosis: 1. Gastroenteritis 2. 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 Normocytic anemia 4. AKI 5. Candidal intertrigo 6. COPD  Discharge Medications: Allergies as of 11/14/2018      Reactions   Other Diarrhea, Nausea And Vomiting, Anxiety, Other (See Comments)   Unknown pain medication: caused sweating, nervousness, and "Some new pain med given at [facility], but I don't know what it's called." Patient states "it knocked me out and I got sweaty and they gave me benadryl."      Medication List    STOP taking these medications   amLODipine 10 MG tablet Commonly known as:  NORVASC   lisinopril 30 MG tablet Commonly known as:  PRINIVIL,ZESTRIL     TAKE these medications   acetaminophen 650 MG CR tablet Commonly known as:  TYLENOL Take 650 mg by mouth See admin instructions. Take 650 mg by mouth two times a day and an additional 650 mg at midday as needed for breakthrough pain   ADVAIR DISKUS 250-50 MCG/DOSE Aepb Generic drug:  Fluticasone-Salmeterol Inhale 1 puff into the lungs 2 (two) times daily.   ARTIFICIAL TEARS PF OP Place 1 drop into both eyes 2 (two) times daily.   BASAGLAR KWIKPEN 100 UNIT/ML Sopn Inject 50 Units into the skin at bedtime.   BIOFREEZE EX Apply 1 application topically See admin instructions. Apply to affected areas 2 times a day for pain   BIOTENE DRY MOUTH MOISTURIZING Soln 1 spray by Mouth Rinse route See admin instructions. Spray in the mouth 6 times a day   chlorhexidine 0.12 % solution Commonly known as:  PERIDEX 5 mLs See admin instructions. Brush 1 teaspoonful of solution onto teeth and gums after PM mouth care/spit out excess and do not rinse   diphenhydramine-acetaminophen 25-500 MG Tabs  tablet Commonly known as:  TYLENOL PM Take 2 tablets by mouth at bedtime.   DULoxetine 60 MG capsule Commonly known as:  CYMBALTA Take 60 mg by mouth daily.   enoxaparin 100 MG/ML injection Commonly known as:  LOVENOX Inject 0.9 mLs (90 mg total) into the skin every 12 (twelve) hours.   gabapentin 600 MG tablet Commonly known as:  NEURONTIN Take 600 mg by mouth 2 (two) times daily.   insulin aspart 100 UNIT/ML FlexPen Commonly known as:  NOVOLOG FLEXPEN Inject 10 Units into the skin once as needed for up to 1 day (for a BGL of 504).   ipratropium-albuterol 0.5-2.5 (3) MG/3ML Soln Commonly known as:  DUONEB Take 3 mLs by nebulization every 4 (four) hours as needed. What changed:  reasons to take this   Melatonin 10 MG Caps Take 10 mg by mouth at bedtime.   metFORMIN 1000 MG tablet Commonly known as:  GLUCOPHAGE Take 1,000 mg by mouth 2 (two) times daily with a meal.   MICRO GUARD 2 % powder Generic drug:  miconazole Apply 1 application topically See admin instructions. Apply to skin folds and perineum 2 times a day   mineral oil-hydrophilic petrolatum ointment Apply 1 application topically See admin instructions. Apply to dry skin daily and after bathing   Oxycodone HCl 10 MG Tabs Take 1 tablet (10 mg total) by mouth every 4 (four) hours as needed for severe pain.   PROVENTIL HFA 108 (90 Base) MCG/ACT inhaler Generic  drug:  albuterol Inhale 2 puffs into the lungs every 6 (six) hours as needed for wheezing or shortness of breath.   rOPINIRole 2 MG tablet Commonly known as:  REQUIP Take 2 mg by mouth at bedtime.   rosuvastatin 20 MG tablet Commonly known as:  CRESTOR Take 20 mg by mouth at bedtime.   sennosides-docusate sodium 8.6-50 MG tablet Commonly known as:  SENOKOT-S Take 2 tablets by mouth at bedtime.   traZODone 50 MG tablet Commonly known as:  DESYREL Take 50 mg by mouth at bedtime.   Turmeric 500 MG Caps Take 500 mg by mouth 2 (two) times daily.    Vitamin D3 1.25 MG (50000 UT) Caps Take 50,000 Units by mouth every Sunday.   warfarin 7.5 MG tablet Commonly known as:  COUMADIN Take 1 tablet (7.5 mg total) by mouth daily.       Disposition and follow-up:   Gabriella [name] [facility] St[facility].  At the hospital follow up visit please address:  1.  Left lower extremity emboly s/p embolectomy: Please assess pain, she should have follow up with vascular. Unclear source, will need loop recorder or 30 day monitor placed outpatient. She is currently on lovenox 90 BID and Coumadin 7.5 daily, please check INR daily and adjust coumadin as needed, once she is therapeutic she can discontinue lovenox.   Normocytic anemia, transfused 2 units, please recheck labs.  AKI: please recheck labs.   2.  Labs / imaging needed at time of follow-up: CBC, BMP, daily INR  3.  Pending labs/ test needing follow-up: None  Follow-up Appointments: Follow-up Information    Sherren KernsFields, Charles E, MD Follow up in 2 week(s).   Specialties:  Vascular Surgery, Cardiology Why:  office will call Contact information: [address] [place] [address] [phone]        Miguel AschoffWilliams, Julie Anne, MD. Schedule an appointment as soon as possible for a visit in 1 week(s).   Specialty:  Internal Medicine Contact information: [address] [place] [address] [phone]           Hospital Course by problem list: 1. Gastroenteritis: This is a 78 year old female with a history of DM, COPD, HTN, and arthritis who presented with worsening diarrhea, abdominal pain, and generalized weakness. Found to be tachycardic, tachypneic, with soft blood pressures. Labs are significant for metabolic acidosis, creatinine 2.2, baseline 0.8, elevated lactic acid of 5.2, and leukocytosis of 26. CT scan showed small bowel enteritis. GI and C diff panel was negative. She was treated with IV fluids, leukocytosis improved and she remained  afebrile. She was just given supportive care and she improved symptomatically. Repeat CT scan for her embolic source showed improvement in her enteritis. She had clinical improvement and her gastroenteritis resolved.   2. 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 DVT was done that showed no DVT however artery occlusions. CTA showed embolus to LLE and she was taken for an emergent embolectomy. Pain controlled with percocet and fentanyl. Echocardiogram showed EF 60-65%, LVH, G1DD, normal biatrial size, normal IVC, no wall motion abnormalities. CTA chest showed no source of the emboli. Antihphospholipid panel was negative. Attempted to get loop recorder how this was not able to be done inpatient, she will likely benefit from a loop recorder or a 30 day Holter monitor. She was  continued on Lovenox and Coumadin on discharge, she was still not therapeutic. Pain medications were switched to OxyIR 10 mg q4 hours.   3. Normocytic anemia: Hgb dropped to 7.9 on 2/19. She had increased bleeding out of lower leg surgical site and heparin and coumadin were held and she was transfused 2 units of pRBC. Hgb improved to 9.2 and remained stable. Heparin and coumadin were restarted. She did not have any bleeding since that time. Switched over to Lovenox and coumadin, INR was not therapeutic so Lovenox was continued on discharge.   4. AKI: Cr elevated to 2/2 on admission, baseline around 0.8. Given maintenance fluids and this resolved.   5. Candidal intertrigo: Treated with nystatin powder.  6. COPD: Duonebs PRN was continued on admission.   Discharge Vitals:   BP 114/66 (BP Location: Left Arm)   Pulse 69   Temp (!) 97.5 F (36.4 C) (Oral)   Resp 16   Ht 5\' 3"  (1.6 m)   Wt 91.9 kg   SpO2 94%   BMI 35.89 kg/m   Pertinent Labs, Studies, and Procedures:  CBC  Latest Ref Rng & Units 11/12/2018 11/11/2018 11/10/2018  WBC 4.0 - 10.5 K/uL 10.2 9.1 8.6  Hemoglobin 12.0 - 15.0 g/dL 4.8(G) 8.9(V) 6.9(I)  Hematocrit 36.0 - 46.0 % 29.2(L) 30.8(L) 29.5(L)  Platelets 150 - 400 K/uL 158 160 142(L)   BMP Latest Ref Rng & Units 11/11/2018 11/10/2018 11/09/2018  Glucose 70 - 99 mg/dL 503(U) 882(C) 003(K)  BUN 8 - 23 mg/dL 14 14 14   Creatinine 0.44 - 1.00 mg/dL 9.17 9.15 0.56(P)  Sodium 135 - 145 mmol/L 139 136 136  Potassium 3.5 - 5.1 mmol/L 4.4 4.2 4.2  Chloride 98 - 111 mmol/L 100 100 99  CO2 22 - 32 mmol/L 32 30 30  Calcium 8.9 - 10.3 mg/dL 8.1(L) 8.0(L) 8.1(L)    11/08/18 CT angio chest: IMPRESSION: Chest CTA impression:  1. No explanation for arterial embolic disease affecting the left lower extremity. Specifically, no evidence of thoracic aortic aneurysm, dissection or intramural hematoma. Further evaluation could be performed with cardiac echo as clinically indicated. 2. Indeterminate right middle and lower lobe pulmonary nodules, largest of which within the right lower lobe measures 5 mm in diameter. No follow-up needed if patient is low-risk (and has no known or suspected primary neoplasm). Non-contrast chest CT can be considered in 12 months if patient is high-risk. This recommendation follows the consensus statement: Guidelines for Management of Incidental Pulmonary Nodules Detected on CT Images: From the Fleischner Society 2017; Radiology 2017; 284:228-243.  Abdomen and pelvic CTA impression:  1. Minimal amount of atherosclerotic plaque within normal caliber abdominal aorta, not resulting in hemodynamically significant stenosis. No abdominal aortic dissection or periaortic stranding to suggest the etiology of the patient's left lower extremity arterial embolic disease. 2. Aortic Atherosclerosis (ICD10-I70.0). 3. No acute findings within the abdomen or pelvis. Specifically, no evidence of end organ ischemia.  11/02/18 CT  abdomen/pelvis:  IMPRESSION: 1. Mild diffuse small bowel thickening query small bowel enteritis. 2. Uncomplicated cholelithiasis. 3. Bilateral fat containing inguinal hernias.  11/05/18 CT angio AO + BIFEM: IMPRESSION: VASCULAR  1. Minimal amount of atherosclerotic plaque within normal caliber abdominal aorta, not resulting in a hemodynamically significant stenosis. Aortic Atherosclerosis (ICD10-I70.0).  Left lower extremity vascular Impression:  1. Findings concerning for distal embolism with short-segment occlusion of the distal aspect of the left SFA/proximal popliteal artery, short-segment occlusion of the tibioperoneal trunk and suspected distal embolism involving the origin of  the left dorsalis pedis artery. As there is no significant upstream vascular disease within the abdomen or pelvis or inflow vascular of the left lower extremity, potential etiologies could include thoracic aortic aneurysm/dissection or a cardiogenic source of emboli. Further evaluation with cardiac echo and or dedicated chest CTA could be performed as indicated. 2. The remainder of the left lower extremity arterial system appears widely patent without a hemodynamically significant stenosis.  Right lower extremity vascular impression:  1. No evidence of a hemodynamically significant stenosis affecting the right lower extremity arterial system. 2. Widely patent three-vessel runoff to the right lower leg and foot. No discrete intraluminal filling defects to suggest distal embolism affecting the right lower extremity.  ________________________________________________________  NON-VASCULAR  1. Interval improvement of previously question diffuse small bowel wall thickening, currently without CT explanation for patient's generalized abdominal pain. Specifically, no CT evidence of mesenteric ischemia. No pneumatosis or portal venous gas. 2. Cholelithiasis without evidence of  cholecystitis. 3. Colonic diverticulosis without evidence of superimposed diverticulitis. 4. Trace bilateral effusions with diffuse body wall anasarca, nonspecific though could be seen in the setting early pulmonary edema/congestive heart failure. 5. Moderate-sized bilateral mesenteric fat containing indirect inguinal hernias.  Discharge Instructions: Discharge Instructions    Call MD for:  difficulty breathing, headache or visual disturbances   Complete by:  As directed    Call MD for:  extreme fatigue   Complete by:  As directed    Call MD for:  hives   Complete by:  As directed    Call MD for:  persistant dizziness or light-headedness   Complete by:  As directed    Call MD for:  persistant nausea and vomiting   Complete by:  As directed    Call MD for:  redness, tenderness, or signs of infection (pain, swelling, redness, odor or green/yellow discharge around incision site)   Complete by:  As directed    Call MD for:  severe uncontrolled pain   Complete by:  As directed    Call MD for:  temperature >100.4   Complete by:  As directed    Diet - low sodium heart healthy   Complete by:  As directed    Discharge instructions   Complete by:  As directed    Gabriella [name],   It has been a pleasure working with you and we are glad you're feeling better. You were hospitalized for a stomach virus and you were found to have a clot in the arteries of your leg.   For your blood clots,  START taking Coumadin daily Start taking OxyIR 10 mg every 4 hours as needed for pain control Follow up with vascular surgery, they will contact you to set up an appointment   Your blood pressures were a little low so we held your amlodipine and lisinopril. Please continue to hold these for now. Your PCP can decide if you need to restart this.   Follow up with your primary care provider in 1-2 weeks  If your symptoms worsen or you develop new symptoms, please seek medical help whether it is your primary  care provider or emergency department.  If you have any questions about this hospitalization please call [phone].   Increase activity slowly   Complete by:  As directed       Signed: Claudean SeveranceKrienke, Marissa M, MD 11/14/2018, 1:10 PM   Pager: [phone]

## 2018-11-03 NOTE — Progress Notes (Signed)
Subjective: Ms. Cardiff is resting comfortably, no acute events overnight.  Patient reports that she is having significant left leg pain and has been having cramping, she has a wound.  She normally uses Biofreeze helps.  We gave her some Voltaren gel which she states has not been helping.  She has not been having any diarrhea since she has been here, abdominal pain has improved but still present.  She was asking for a diet and states that she is very hungry.  All questions were answered.   Objective:  Vital signs in last 24 hours: Vitals:   11/02/18 2000 11/02/18 2130 11/02/18 2212 11/03/18 0404  BP: (!) 108/56 (!) 139/97  (!) 110/50  Pulse: 96 93  77  Resp: 15 14  18   Temp:    (!) 97.4 F (36.3 C)  TempSrc:    Oral  SpO2: 97% 97%  100%  Weight:   89.6 kg   Height:   5\' 3"  (1.6 m)     General: Frail appearing female, NAD, resting comfortably Cardiac: RRR, systolic murmur Pulmonary: CTABL, no wheezing or rhonchi Abdomen: Soft, non-tender to palpation, no guarding, +BS Extremity: Warm, left foot is slightly more cool then right, 1+ pulses, tenderness of left calf   Assessment/Plan:  Active Problems:   Gastroenteritis  This is a 78 year old female with a history of DM, COPD, HTN, and arthritis who presented with worsening diarrhea, abdominal pain, and generalized weakness. Found to be tachycardic, tachypneic, with soft blood pressures. Labs are significant for a anion gap metabolic acidosis, creatinine 2.2, baseline 0.8, elevated lactic acid of 5.2, and leukocytosis of 26.   Abdominal pain 2/2 gastroenteritis: Sepsis (improving) -CT scan showed small bowel enteritis. GI panel is still pending. Leukocytosis has improved and she remains afebrile. Today she reports that she has not had any diarrhea since she has been here, she is still having abdominal pain however this has improved. We will continue supportive care for now. She did have some sick contacts and this may be a bacterial  gastroenteritis vs a viral gastroenteritis. No localized findings on exam. We will not start any antibiotics and this time and will continue to trend fever curve, WBC and lactate.  -Continue zofran and fentanyl PRN for pain control -F/u Blood cultures -F/u urinalysis  -Continue maintenance fluids -Restart diet   Anion gap metabolic acidosis: -Likely 2/2 elevated lactic acid or a mild DKA. She still has a metabolic acidosis but AG has improved.Her diarrhea has resolved which may be helping as well. Continue fluid resuscitation.  -Continue NS at 125 cc/hr -Repeat BMP in AM  Leg cramping: -She reports that she has this at home and takes Biofreeze for it which helps. Pulses are equal but reduced.  Slightly cooler on the left compared to the right. Tenderness to palpation. Her electrolytes were unremarkable today.  -Continue Voltaren gel -Advised patient that she has fentanyl for pain control -Continue to monitor  Uncontrolled Diabetes: -She is on insulin and metformin at home. She reports that she does not always take her medications. A1c here was 11.5. It appears that she has poor control of her diabetes which may be contributing to her abdominal pain. There is still a question of mild DKA given then metabolic acidosis. She has had good reponse to the insulin injections thus far. Will continue to monitor.  -Continue lantus 35 units daily -Continue novolog 6 units TID WC -SSI -Frequent CBGs  AKI: -Improved to 1.7 from 2.2 on admission. Likely  pre-renal 2/2 dehydration from diarrhea. She received maintenance fluids overnight.  -Continue maintenance fluids -Repeat BMP in AM  Candidal intertrigo: -Continue nystatin powder   COPD:  -Continue duonebs PRN  FEN: NS 125cc/hr, replete lytes prn, full liquid diet VTE ppx: SCDs Code Status: FULL    Dispo: Anticipated discharge is pending clinical improvement.   Claudean Severance, MD 11/03/2018, 7:02 AM Pager: [phone]

## 2018-11-03 NOTE — Progress Notes (Signed)
ABI's have been completed. Preliminary results can be found in CV Proc through chart review.   11/03/18 2:53 PM Olen Cordial RVT

## 2018-11-03 NOTE — Progress Notes (Signed)
Inpatient Diabetes Program Recommendations  AACE/ADA: New Consensus Statement on Inpatient Glycemic Control (2015)  Target Ranges:  Prepandial:   less than 140 mg/dL      Peak postprandial:   less than 180 mg/dL (1-2 hours)      Critically ill patients:  140 - 180 mg/dL   Spoke with patient about diabetes and home regimen for diabetes control. Patient reports that she is followed by doctors at [facility] for diabetes management and her last visit was Tuesday. Patient reports that her insulin was increased at that time.  Patient states that she checks her glucose 5-6 times a week. Her glucose is in the 200-300 range.   Discussed A1C results 11.5% this admission. Discussed glucose and A1C goals. Discussed importance of checking CBGs and maintaining good CBG control to prevent long-term and short-term complications. Explained how hyperglycemia leads to damage within blood vessels which lead to the common complications seen with uncontrolled diabetes. Stressed to the patient the importance of improving glycemic control to prevent further complications from uncontrolled diabetes. Discussed impact of nutrition, exercise, stress, sickness, and medications on diabetes control.  Patient reports she likes to eat 4 apples with peanut butter and would not sometimes not eat for days at a time.  Discussed carbohydrates, carbohydrate goals per day and meal, along with portion sizes.   Patient verbalized understanding of information discussed and he states that he has no further questions at this time related to diabetes.  Thanks,  Christena Deem RN, MSN, BC-ADM Inpatient Diabetes Coordinator Team Pager [phone] (8a-5p)

## 2018-11-04 LAB — COMPREHENSIVE METABOLIC PANEL
ALT: 34 U/L (ref 0–44)
AST: 53 U/L — ABNORMAL HIGH (ref 15–41)
Albumin: 2 g/dL — ABNORMAL LOW (ref 3.5–5.0)
Alkaline Phosphatase: 30 U/L — ABNORMAL LOW (ref 38–126)
Anion gap: 6 (ref 5–15)
BILIRUBIN TOTAL: 0.4 mg/dL (ref 0.3–1.2)
BUN: 27 mg/dL — ABNORMAL HIGH (ref 8–23)
CO2: 21 mmol/L — ABNORMAL LOW (ref 22–32)
CREATININE: 1.1 mg/dL — AB (ref 0.44–1.00)
Calcium: 8.1 mg/dL — ABNORMAL LOW (ref 8.9–10.3)
Chloride: 113 mmol/L — ABNORMAL HIGH (ref 98–111)
GFR calc Af Amer: 57 mL/min — ABNORMAL LOW (ref >60)
GFR calc non Af Amer: 49 mL/min — ABNORMAL LOW (ref >60)
Glucose, Bld: 147 mg/dL — ABNORMAL HIGH (ref 70–99)
Potassium: 4.4 mmol/L (ref 3.5–5.1)
Sodium: 140 mmol/L (ref 135–145)
TOTAL PROTEIN: 4.3 g/dL — AB (ref 6.5–8.1)

## 2018-11-04 LAB — CBC
HCT: 31.8 % — ABNORMAL LOW (ref 36.0–46.0)
Hemoglobin: 10.8 g/dL — ABNORMAL LOW (ref 12.0–15.0)
MCH: 31 pg (ref 26.0–34.0)
MCHC: 34 g/dL (ref 30.0–36.0)
MCV: 91.4 fL (ref 80.0–100.0)
Platelets: 159 10*3/uL (ref 150–400)
RBC: 3.48 MIL/uL — ABNORMAL LOW (ref 3.87–5.11)
RDW: 12.8 % (ref 11.5–15.5)
WBC: 10.6 10*3/uL — ABNORMAL HIGH (ref 4.0–10.5)
nRBC: 0 % (ref 0.0–0.2)

## 2018-11-04 LAB — GLUCOSE, CAPILLARY
GLUCOSE-CAPILLARY: 105 mg/dL — AB (ref 70–99)
Glucose-Capillary: 109 mg/dL — ABNORMAL HIGH (ref 70–99)
Glucose-Capillary: 126 mg/dL — ABNORMAL HIGH (ref 70–99)
Glucose-Capillary: 213 mg/dL — ABNORMAL HIGH (ref 70–99)

## 2018-11-04 MED ORDER — GLUCERNA SHAKE PO LIQD
237.0000 mL | Freq: Two times a day (BID) | ORAL | Status: DC
  Administered 2018-11-04 – 2018-11-14 (×17): 237 mL via ORAL
  Filled 2018-11-04 (×2): qty 237

## 2018-11-04 MED ORDER — ENOXAPARIN SODIUM 40 MG/0.4ML ~~LOC~~ SOLN
40.0000 mg | SUBCUTANEOUS | Status: DC
  Administered 2018-11-04: 40 mg via SUBCUTANEOUS
  Filled 2018-11-04: qty 0.4

## 2018-11-04 NOTE — Progress Notes (Signed)
   Subjective: Gabriella [name] reports improvement in abdominal pain and nausea but is having continued episodes of left leg pain.  She is eager to advance her diet today  Objective:  Vital signs in last 24 hours: Vitals:   11/02/18 2212 11/03/18 0404 11/03/18 1440 11/03/18 2117  BP:  (!) 110/50 (!) 132/57 (!) 117/56  Pulse:  77 (!) 104 99  Resp:  18 20 18   Temp:  (!) 97.4 F (36.3 C) 98 F (36.7 C) 97.9 F (36.6 C)  TempSrc:  Oral Oral Oral  SpO2:  100% 97% 98%  Weight: 89.6 kg     Height: 5\' 3"  (1.6 m)       General: Frail appearing female, NAD, resting comfortably Cardiac: RRR, systolic murmur Pulmonary: CTABL, no wheezing or rhonchi Abdomen: Soft, non-tender to palpation, no guarding, +BS Extremity: Lower extremities are warm and symmetric without edema, 1+ pulses, exquisite tenderness of left calf   Assessment/Plan:  Active Problems:   Gastroenteritis   AKI (acute kidney injury) (HCC)   Diarrhea   Hyperglycemia  This is a 78 year old female with a history of DM, COPD, HTN, and arthritis who presented with worsening diarrhea, abdominal pain, and generalized weakness. Found to be tachycardic, tachypneic, with soft blood pressures. Labs are significant for a anion gap metabolic acidosis, creatinine 2.2, baseline 0.8, elevated lactic acid of 5.2, and leukocytosis of 26.   Abdominal pain 2/2 gastroenteritis: CT scan showed small bowel enteritis. GI panel is still pending. Leukocytosis has improved and she remains afebrile. Today she reports that she has not had any diarrhea since she has been here, she is still having abdominal pain however this has improved. We will continue supportive care for now. -Continue zofran and fentanyl PRN for pain control -advance diet -F/u Blood cultures -F/u urinalysis  -Continue maintenance fluids -advance diet to carb mod  Leg cramping: her symptoms are intermittent and may be related to claudication but seems to be concentrated in the  calf -Continue Voltaren gel -Advised patient that she has fentanyl for pain control -DVT US today to rule out DVT   Uncontrolled Diabetes: She is on insulin and metformin at home. She reports that she does not always take her medications. A1c here was 11.5.   -Continue lantus 35 units daily -Continue novolog 6 units TID WC -SSI -Frequent CBGs  AKI: Likely pre-renal 2/2 dehydration from diarrhea. She received maintenance fluids overnight.  -Improving, continue maintenance fluids -Repeat BMP in AM  Candidal intertrigo: -Continue nystatin powder   COPD:  -Continue duonebs PRN  FEN: LR 100cc/hr, replete lytes prn, carb mod VTE ppx: SCDs Code Status: FULL    Dispo: Anticipated discharge is pending clinical improvement.   Gabriella Ingles, MD 11/04/2018, 7:34 AM

## 2018-11-04 NOTE — Evaluation (Signed)
Physical Therapy Evaluation Patient Details Name: Gabriella [name] MRN: [id number] DOB: 03/04/1941 Today's Date: 11/04/2018   History of Present Illness  Gabriella [name] is a 78 year old woman with pMH of HTN, DM2, COPD, arthritis who presented with a diarrheal illness, guaiac + stool and abdominal pain.  She was dizzy and weak at home, she was noted to have enteritis on CT scan.  She also reported severe left leg pain. She met sepsis criteria. She is [facility] the [facility]  Clinical Impression  Pt admitted with above diagnosis. Pt currently with functional limitations due to the deficits listed below (see PT Problem List). Prior to admission, pt uses Rollator for mobility, needs assistance for ADL's/IADL's. Presently, pt presents with decreased functional mobility secondary to significant pain in LLE. Requiring two person moderate assistance for transfers and is unable to walk. Pt will benefit from skilled PT to increase their independence and safety with mobility to allow discharge to the venue listed below.       Follow Up Recommendations SNF    Equipment Recommendations  None recommended by PT    Recommendations for Other Services       Precautions / Restrictions Precautions Precautions: Fall Restrictions Weight Bearing Restrictions: No      Mobility  Bed Mobility Overal bed mobility: Needs Assistance Bed Mobility: Supine to Sit     Supine to sit: Mod assist;+2 for safety/equipment;HOB elevated     General bed mobility comments: vc for sequencing, assist for trunk elevation, and heavy use of rail +2 for safety  Transfers Overall transfer level: Needs assistance Equipment used: Rolling walker (2 wheeled) Transfers: Sit to/from [facility] Sit to Stand: Mod assist;+2 physical assistance;+2 safety/equipment;From elevated surface Stand pivot transfers: Min assist;+2 physical assistance;+2 safety/equipment       General transfer comment: assist for boost and  balance in standing, vc for safe hand placement, Pt required elevated bed  Ambulation/Gait                Stairs            Wheelchair Mobility    Modified Rankin (Stroke Patients Only)       Balance Overall balance assessment: Needs assistance;History of Falls Sitting-balance support: Feet supported Sitting balance-Leahy Scale: Fair Sitting balance - Comments: moments of light headedness   Standing balance support: Bilateral upper extremity supported;During functional activity Standing balance-Leahy Scale: Poor Standing balance comment: dependent on BUE on RW                             Pertinent Vitals/Pain Pain Assessment: Faces Faces Pain Scale: Hurts even more Pain Location: LLE Pain Descriptors / Indicators: Grimacing;Constant Pain Intervention(s): Monitored during session    Home Living Family/patient expects to be discharged to:: Private residence Living Arrangements: Children Available Help at Discharge: Family;Personal care attendant;Other (Comment) Type of Home: Apartment Home Access: Level entry     Home Layout: One level Home Equipment: Walker - 4 wheels;Shower seat;Grab bars - tub/shower;Hand held shower head Additional Comments: [facility]; has an aide that comes to perform IADL aroud the house    Prior Function Level of Independence: Needs assistance   Gait / Transfers Assistance Needed: uses Rollator for community mobility  ADL's / Homemaking Assistance Needed: CNA assists with ADL's/IADL 3 days/week  Comments: has Meals on Wheels for days she is not at [place]  Hand: Right    Extremity/Trunk Assessment   Upper Extremity Assessment Upper Extremity Assessment: Generalized weakness RUE Deficits / Details: edema noted in RUE - educated on elevation and hand, wrist, elbow movement for edema management RUE Sensation: WNL RUE Coordination: decreased fine motor    Lower Extremity  Assessment Lower Extremity Assessment: Generalized weakness    Cervical / Trunk Assessment Cervical / Trunk Assessment: Other exceptions Cervical / Trunk Exceptions: obesity  Communication   Communication: No difficulties  Cognition Arousal/Alertness: Awake/alert(initially lethargic, able to arouse) Behavior During Therapy: WFL for tasks assessed/performed Overall Cognitive Status: No family/caregiver present to determine baseline cognitive functioning                                        General Comments      Exercises     Assessment/Plan    PT Assessment Patient needs continued PT services  PT Problem List Decreased strength;Decreased activity tolerance;Decreased balance;Decreased mobility;Decreased cognition;Decreased safety awareness;Pain       PT Treatment Interventions DME instruction;Gait training;Functional mobility training;Therapeutic activities;Therapeutic exercise;Balance training;Patient/family education    PT Goals (Current goals can be found in the Care Plan section)  Acute Rehab PT Goals Patient Stated Goal: get back home to go to [facility] PT Goal Formulation: With patient Time For Goal Achievement: 11/18/18 Potential to Achieve Goals: Fair    Frequency Min 3X/week   Barriers to discharge        Co-evaluation PT/OT/SLP Co-Evaluation/Treatment: Yes Reason for Co-Treatment: For patient/therapist safety;To address functional/ADL transfers PT goals addressed during session: Mobility/safety with mobility OT goals addressed during session: ADL's and self-care;Proper use of Adaptive equipment and DME       AM-PAC PT "6 Clicks" Mobility  Outcome Measure Help needed turning from your back to your side while in a flat bed without using bedrails?: None Help needed moving from lying on your back to sitting on the side of a flat bed without using bedrails?: A Lot Help needed moving to and from a bed to a chair (including a wheelchair)?: A  Lot Help needed standing up from a chair using your arms (e.g., wheelchair or bedside chair)?: A Lot Help needed to walk in hospital room?: A Lot Help needed climbing 3-5 steps with a railing? : Total 6 Click Score: 13    End of Session Equipment Utilized During Treatment: Gait belt Activity Tolerance: Patient tolerated treatment [name] Patient left: in chair;with call bell/phone within reach;with chair alarm set Nurse Communication: Mobility status PT Visit Diagnosis: Unsteadiness on feet (R26.81);Muscle weakness (generalized) (M62.81);Difficulty in walking, not elsewhere classified (R26.2);Pain Pain - Right/Left: Left Pain - part of body: Leg    Time: 1100-1140 PT Time Calculation (min) (ACUTE ONLY): 40 min   Charges:   PT Evaluation $PT Eval Moderate Complexity: 1 Mod         Ellamae Sia, [place], DPT Acute Rehabilitation Services Pager [phone] Office [phone]   Willy Eddy 11/04/2018, 2:04 PM

## 2018-11-04 NOTE — Progress Notes (Signed)
  Date: 11/04/2018  Patient name: Gabriella [name]  Medical record number: [id number]  Date of birth: 1941-09-03   I have seen and evaluated this patient and I have discussed the plan of care with the house staff. Please see their note for complete details. I concur with their findings with the following additions/corrections:   Admitted for dehydration in the context of possible gastroenteritis.  Symptoms seem to be improving, she was advanced to a full diet today and was about to eat lunch when I saw her.  She has significant peripheral vascular disease as evidenced by her ABI.  On my exam, the left foot is cooler than the right with delayed cap refill.  She complains of intermittent pain in that left leg, although primarily localized to the calf.  We are obtaining an ultrasound of that leg to evaluate for DVT.  I suspect she will need vascular consultation for her significant PVD.  With that in mind, a possible etiology for her abdominal pain and FOBT positive stool is ischemic colitis.  She has had significantly elevated lactate here as well.  We will see how she tolerates her diet today.  If she continues to have significant abdominal pain after meals, her creatinine is probably improved enough to obtain angiography to further evaluate her mesenteric vasculature and lower extremity vasculature.  Jessy Oto, M.D., Ph.D. 11/04/2018, 5:03 PM

## 2018-11-04 NOTE — Evaluation (Signed)
Occupational Therapy Evaluation Patient Details Name: Gabriella [name] MRN: [id number] DOB: Mar 19, 1941 Today's Date: 11/04/2018    History of Present Illness Ms. Henzler is a 78 year old woman with pMH of HTN, DM2, COPD, arthritis who presented with a diarrheal illness, guaiac + stool and abdominal pain.  She was dizzy and weak at home, she was noted to have enteritis on CT scan.  She also reported severe left leg pain. She met sepsis criteria. She is [facility] in the [facility]   Clinical Impression   PTA Pt used Rollator for mobility and has a CNA 3 x a week for assist with ADL/IADL. Pt is having significant pain (hyper sensitive in LLE).  Pt is currently mod A +2 with RW for transfers, max A +2 for LB bathing/dressing and set up for grooming/eating. Pt will benefit from skilled OT in the acute setting as well as afterwards at the SNF level to maximize safety and independence in ADL and functional transfers. Next session to focus on AE education for the LB and continued practice with transfers.    Follow Up Recommendations  SNF;Supervision/Assistance - 24 hour    Equipment Recommendations  None recommended by OT(Pt has appropriate DME)    Recommendations for Other Services       Precautions / Restrictions Precautions Precautions: Fall Restrictions Weight Bearing Restrictions: No      Mobility Bed Mobility Overal bed mobility: Needs Assistance Bed Mobility: Supine to Sit     Supine to sit: Mod assist;+2 for safety/equipment;HOB elevated     General bed mobility comments: vc for sequencing, assist for trunk elevation, and heavy use of rail +2 for safety  Transfers Overall transfer level: Needs assistance Equipment used: Rolling walker (2 wheeled) Transfers: Sit to/from [facility] Sit to Stand: Mod assist;+2 physical assistance;+2 safety/equipment;From elevated surface Stand pivot transfers: Min assist;+2 physical assistance;+2 safety/equipment       General  transfer comment: assist for boost and balance in standing, vc for safe hand placement, Pt required elevated bed    Balance Overall balance assessment: Needs assistance;History of Falls Sitting-balance support: Feet supported Sitting balance-Leahy Scale: Fair Sitting balance - Comments: moments of light headedness   Standing balance support: Bilateral upper extremity supported;During functional activity Standing balance-Leahy Scale: Poor Standing balance comment: dependent on BUE on RW                           ADL either performed or assessed with clinical judgement   ADL Overall ADL's : Needs assistance/impaired Eating/Feeding: Set up;Sitting   Grooming: Wash/dry face;Set up;Sitting Grooming Details (indicate cue type and reason): EOB Upper Body Bathing: Minimal assistance;Sitting   Lower Body Bathing: Maximal assistance;Sitting/lateral leans Lower Body Bathing Details (indicate cue type and reason): to wash down legs and peri area Upper Body Dressing : Set up;Sitting Upper Body Dressing Details (indicate cue type and reason): to don new hospital gown Lower Body Dressing: Maximal assistance;Sitting/lateral leans;+2 for physical assistance;+2 for safety/equipment Lower Body Dressing Details (indicate cue type and reason): to don socks, brief Toilet Transfer: Minimal assistance;+2 for physical assistance;+2 for safety/equipment;Stand-pivot;BSC;RW Toilet Transfer Details (indicate cue type and reason): very small pivotal steps to [facility], cues for safety Toileting- Clothing Manipulation and Hygiene: Maximal assistance;+2 for physical assistance;+2 for safety/equipment;Sit to/from stand Toileting - Clothing Manipulation Details (indicate cue type and reason): to manage peri care, and for pulling up underwear - pt dependent on BUE on RW     Functional  mobility during ADLs: Moderate assistance;+2 for physical assistance;+2 for safety/equipment;Rolling walker;Cueing for safety        Vision         Perception     Praxis      Pertinent Vitals/Pain Pain Assessment: Faces Pain Location: LLE     Hand Dominance Right   Extremity/Trunk Assessment Upper Extremity Assessment Upper Extremity Assessment: Generalized weakness;RUE deficits/detail RUE Deficits / Details: edema noted in RUE - educated on elevation and hand, wrist, elbow movement for edema management RUE Sensation: WNL RUE Coordination: decreased fine motor   Lower Extremity Assessment Lower Extremity Assessment: Defer to PT evaluation   Cervical / Trunk Assessment Cervical / Trunk Assessment: Other exceptions Cervical / Trunk Exceptions: obesity   Communication Communication Communication: No difficulties   Cognition Arousal/Alertness: Awake/alert(initially lethargic, able to arouse) Behavior During Therapy: WFL for tasks assessed/performed Overall Cognitive Status: No family/caregiver present to determine baseline cognitive functioning                                     General Comments       Exercises     Shoulder Instructions      Home Living Family/patient expects to be discharged to:: Private residence Living Arrangements: Children Available Help at Discharge: Family;Personal care attendant;Other (Comment) Type of Home: Apartment Home Access: Level entry     Home Layout: One level     Bathroom Shower/Tub: Tub/shower unit         Home Equipment: Walker - 4 wheels;Shower seat;Grab bars - tub/shower;Hand held shower head   Additional Comments: [facility]; has an aide that comes to perform IADL aroud the house      Prior Functioning/Environment Level of Independence: Needs assistance  Gait / Transfers Assistance Needed: uses Rollator for community mobility ADL's / Homemaking Assistance Needed: CNA assists with ADL's/IADL 3 days/week   Comments: has Meals on Wheels for days she is not at [employer]        OT Problem List: Decreased  strength;Decreased range of motion;Decreased activity tolerance;Impaired balance (sitting and/or standing);Decreased knowledge of use of DME or AE;Obesity;Pain;Increased edema      OT Treatment/Interventions: Self-care/ADL training;Therapeutic exercise;Energy conservation;DME and/or AE instruction;Therapeutic activities;Patient/family education;Balance training    OT Goals(Current goals can be found in the care plan section) Acute Rehab OT Goals Patient Stated Goal: get back home to go to [facility] OT Goal Formulation: With patient Time For Goal Achievement: 11/18/18 Potential to Achieve Goals: Good ADL Goals Pt Will Perform Lower Body Bathing: with modified independence;with adaptive equipment;sitting/lateral leans Pt Will Perform Lower Body Dressing: with min guard assist;sit to/from stand Pt Will Transfer to Toilet: with supervision;ambulating Pt Will Perform Toileting - Clothing Manipulation and hygiene: with supervision;sit to/from stand Additional ADL Goal #1: Pt will perform bed mobility at mod I level prior to engaging in ADL  OT Frequency: Min 2X/week   Barriers to D/C: Decreased caregiver support  Pt is at home alone while daughter is at work - [employer] participant       Co-evaluation PT/OT/SLP Co-Evaluation/Treatment: Yes Reason for Co-Treatment: For patient/therapist safety;To address functional/ADL transfers PT goals addressed during session: Mobility/safety with mobility;Balance;Proper use of DME OT goals addressed during session: ADL's and self-care;Proper use of Adaptive equipment and DME      AM-PAC OT "6 Clicks" Daily Activity     Outcome Measure Help from another person eating meals?: None Help from  another person taking care of personal grooming?: A Little Help from another person toileting, which includes using toliet, bedpan, or urinal?: A Lot Help from another person bathing (including washing, rinsing, drying)?: A Lot Help from another person to put on and taking  off regular upper body clothing?: A Little Help from another person to put on and taking off regular lower body clothing?: A Lot 6 Click Score: 16   End of Session Equipment Utilized During Treatment: Gait belt;Rolling walker Nurse Communication: Mobility status;Other (comment)(no pure wick - RN in room at end of session)  Activity Tolerance: Patient tolerated treatment well Patient left: in chair;with call bell/phone within reach;with chair alarm set;with nursing/sitter in room  OT Visit Diagnosis: Unsteadiness on feet (R26.81);Other abnormalities of gait and mobility (R26.89);History of falling (Z91.81);Muscle weakness (generalized) (M62.81);Pain Pain - Right/Left: Left Pain - part of body: Leg                Time: 1100-1140 OT Time Calculation (min): 40 min Charges:  OT General Charges $OT Visit: 1 Visit OT Evaluation $OT Eval Moderate Complexity: 1 Mod OT Treatments $Self Care/Home Management : 8-22 mins  Hulda Humphrey OTR/L Acute Rehabilitation Services Pager: [phone] Office: [place] 11/04/2018, 1:32 PM

## 2018-11-05 ENCOUNTER — Inpatient Hospital Stay (HOSPITAL_COMMUNITY): Payer: Medicare (Managed Care)

## 2018-11-05 ENCOUNTER — Inpatient Hospital Stay (HOSPITAL_COMMUNITY): Payer: Medicare (Managed Care) | Admitting: Anesthesiology

## 2018-11-05 ENCOUNTER — Encounter (HOSPITAL_COMMUNITY): Payer: Self-pay | Admitting: Radiology

## 2018-11-05 ENCOUNTER — Encounter (HOSPITAL_COMMUNITY)
Admission: EM | Disposition: A | Discharge status: Skilled Nursing Facility | Payer: Self-pay | Source: Home / Self Care | Attending: Internal Medicine

## 2018-11-05 DIAGNOSIS — E1151 Type 2 diabetes mellitus with diabetic peripheral angiopathy without gangrene: Secondary | ICD-10-CM

## 2018-11-05 DIAGNOSIS — I10 Essential (primary) hypertension: Secondary | ICD-10-CM

## 2018-11-05 DIAGNOSIS — R011 Cardiac murmur, unspecified: Secondary | ICD-10-CM

## 2018-11-05 DIAGNOSIS — M797 Fibromyalgia: Secondary | ICD-10-CM

## 2018-11-05 DIAGNOSIS — I739 Peripheral vascular disease, unspecified: Secondary | ICD-10-CM | POA: Diagnosis present

## 2018-11-05 DIAGNOSIS — B372 Candidiasis of skin and nail: Secondary | ICD-10-CM

## 2018-11-05 DIAGNOSIS — E119 Type 2 diabetes mellitus without complications: Secondary | ICD-10-CM

## 2018-11-05 DIAGNOSIS — I82412 Acute embolism and thrombosis of left femoral vein: Secondary | ICD-10-CM

## 2018-11-05 DIAGNOSIS — M79609 Pain in unspecified limb: Secondary | ICD-10-CM

## 2018-11-05 DIAGNOSIS — M1990 Unspecified osteoarthritis, unspecified site: Secondary | ICD-10-CM

## 2018-11-05 DIAGNOSIS — Z794 Long term (current) use of insulin: Secondary | ICD-10-CM

## 2018-11-05 DIAGNOSIS — Z86718 Personal history of other venous thrombosis and embolism: Secondary | ICD-10-CM

## 2018-11-05 DIAGNOSIS — Z9114 Patient's other noncompliance with medication regimen: Secondary | ICD-10-CM

## 2018-11-05 DIAGNOSIS — I959 Hypotension, unspecified: Secondary | ICD-10-CM

## 2018-11-05 DIAGNOSIS — Z79899 Other long term (current) drug therapy: Secondary | ICD-10-CM

## 2018-11-05 DIAGNOSIS — I743 Embolism and thrombosis of arteries of the lower extremities: Secondary | ICD-10-CM

## 2018-11-05 DIAGNOSIS — E118 Type 2 diabetes mellitus with unspecified complications: Secondary | ICD-10-CM

## 2018-11-05 DIAGNOSIS — J449 Chronic obstructive pulmonary disease, unspecified: Secondary | ICD-10-CM

## 2018-11-05 DIAGNOSIS — R195 Other fecal abnormalities: Secondary | ICD-10-CM

## 2018-11-05 DIAGNOSIS — I82432 Acute embolism and thrombosis of left popliteal vein: Secondary | ICD-10-CM

## 2018-11-05 HISTORY — PX: FASCIOTOMY: SHX132

## 2018-11-05 HISTORY — PX: EMBOLECTOMY: SHX44

## 2018-11-05 HISTORY — PX: PATCH ANGIOPLASTY: SHX6230

## 2018-11-05 LAB — CBC
HCT: 33.7 % — ABNORMAL LOW (ref 36.0–46.0)
Hemoglobin: 11 g/dL — ABNORMAL LOW (ref 12.0–15.0)
MCH: 29.5 pg (ref 26.0–34.0)
MCHC: 32.6 g/dL (ref 30.0–36.0)
MCV: 90.3 fL (ref 80.0–100.0)
NRBC: 0 % (ref 0.0–0.2)
Platelets: 162 10*3/uL (ref 150–400)
RBC: 3.73 MIL/uL — ABNORMAL LOW (ref 3.87–5.11)
RDW: 12.6 % (ref 11.5–15.5)
WBC: 10.2 10*3/uL (ref 4.0–10.5)

## 2018-11-05 LAB — COMPREHENSIVE METABOLIC PANEL
ALT: 129 U/L — ABNORMAL HIGH (ref 0–44)
AST: 199 U/L — ABNORMAL HIGH (ref 15–41)
Albumin: 2.2 g/dL — ABNORMAL LOW (ref 3.5–5.0)
Alkaline Phosphatase: 40 U/L (ref 38–126)
Anion gap: 7 (ref 5–15)
BUN: 15 mg/dL (ref 8–23)
CALCIUM: 8.6 mg/dL — AB (ref 8.9–10.3)
CO2: 25 mmol/L (ref 22–32)
Chloride: 108 mmol/L (ref 98–111)
Creatinine, Ser: 0.87 mg/dL (ref 0.44–1.00)
GFR calc non Af Amer: >60 mL/min (ref >60)
Glucose, Bld: 80 mg/dL (ref 70–99)
Potassium: 4 mmol/L (ref 3.5–5.1)
Sodium: 140 mmol/L (ref 135–145)
Total Bilirubin: 0.5 mg/dL (ref 0.3–1.2)
Total Protein: 5 g/dL — ABNORMAL LOW (ref 6.5–8.1)

## 2018-11-05 LAB — GLUCOSE, CAPILLARY
Glucose-Capillary: 126 mg/dL — ABNORMAL HIGH (ref 70–99)
Glucose-Capillary: 128 mg/dL — ABNORMAL HIGH (ref 70–99)
Glucose-Capillary: 128 mg/dL — ABNORMAL HIGH (ref 70–99)
Glucose-Capillary: 175 mg/dL — ABNORMAL HIGH (ref 70–99)
Glucose-Capillary: 59 mg/dL — ABNORMAL LOW (ref 70–99)
Glucose-Capillary: 79 mg/dL (ref 70–99)
Glucose-Capillary: 95 mg/dL (ref 70–99)
Glucose-Capillary: 96 mg/dL (ref 70–99)

## 2018-11-05 SURGERY — EMBOLECTOMY
Anesthesia: General | Site: Leg Upper | Laterality: Left

## 2018-11-05 MED ORDER — FENTANYL CITRATE (PF) 100 MCG/2ML IJ SOLN
25.0000 ug | INTRAMUSCULAR | Status: DC | PRN
  Administered 2018-11-05 (×2): 25 ug via INTRAVENOUS
  Administered 2018-11-05: 50 ug via INTRAVENOUS

## 2018-11-05 MED ORDER — ONDANSETRON HCL 4 MG/2ML IJ SOLN
INTRAMUSCULAR | Status: AC
  Filled 2018-11-05: qty 2

## 2018-11-05 MED ORDER — CEFAZOLIN SODIUM-DEXTROSE 2-3 GM-%(50ML) IV SOLR
INTRAVENOUS | Status: DC | PRN
  Administered 2018-11-05: 2 g via INTRAVENOUS

## 2018-11-05 MED ORDER — SODIUM CHLORIDE 0.9 % IV SOLN
INTRAVENOUS | Status: AC
  Filled 2018-11-05: qty 1.2

## 2018-11-05 MED ORDER — EPHEDRINE SULFATE 50 MG/ML IJ SOLN
INTRAMUSCULAR | Status: DC | PRN
  Administered 2018-11-05: 10 mg via INTRAVENOUS

## 2018-11-05 MED ORDER — PROPOFOL 10 MG/ML IV BOLUS
INTRAVENOUS | Status: AC
  Filled 2018-11-05: qty 20

## 2018-11-05 MED ORDER — PHENYLEPHRINE HCL 10 MG/ML IJ SOLN
INTRAMUSCULAR | Status: DC | PRN
  Administered 2018-11-05: 40 ug via INTRAVENOUS

## 2018-11-05 MED ORDER — 0.9 % SODIUM CHLORIDE (POUR BTL) OPTIME
TOPICAL | Status: DC | PRN
  Administered 2018-11-05: 2000 mL

## 2018-11-05 MED ORDER — HEPARIN (PORCINE) 25000 UT/250ML-% IV SOLN
1400.0000 [IU]/h | INTRAVENOUS | Status: DC
  Administered 2018-11-05: 1100 [IU]/h via INTRAVENOUS
  Administered 2018-11-06: 1250 [IU]/h via INTRAVENOUS
  Administered 2018-11-07: 1450 [IU]/h via INTRAVENOUS
  Administered 2018-11-09: 1400 [IU]/h via INTRAVENOUS
  Filled 2018-11-05 (×5): qty 250

## 2018-11-05 MED ORDER — OXYCODONE HCL 5 MG PO TABS: 5.0000 mg | ORAL_TABLET | Freq: Once | ORAL | Status: DC | PRN

## 2018-11-05 MED ORDER — DEXTROSE 50 % IV SOLN
INTRAVENOUS | Status: AC
  Filled 2018-11-05: qty 50

## 2018-11-05 MED ORDER — HEPARIN SODIUM (PORCINE) 1000 UNIT/ML IJ SOLN
INTRAMUSCULAR | Status: AC
  Filled 2018-11-05: qty 2

## 2018-11-05 MED ORDER — FENTANYL CITRATE (PF) 100 MCG/2ML IJ SOLN
INTRAMUSCULAR | Status: AC
  Administered 2018-11-05: 25 ug via INTRAVENOUS
  Filled 2018-11-05: qty 2

## 2018-11-05 MED ORDER — PHENYLEPHRINE 40 MCG/ML (10ML) SYRINGE FOR IV PUSH (FOR BLOOD PRESSURE SUPPORT)
PREFILLED_SYRINGE | INTRAVENOUS | Status: AC
  Filled 2018-11-05: qty 10

## 2018-11-05 MED ORDER — LIDOCAINE HCL (CARDIAC) PF 100 MG/5ML IV SOSY
PREFILLED_SYRINGE | INTRAVENOUS | Status: DC | PRN
  Administered 2018-11-05: 60 mg via INTRATRACHEAL

## 2018-11-05 MED ORDER — INSULIN GLARGINE 100 UNIT/ML ~~LOC~~ SOLN
28.0000 [IU] | Freq: Every day | SUBCUTANEOUS | Status: DC
  Administered 2018-11-06 – 2018-11-11 (×6): 28 [IU] via SUBCUTANEOUS
  Filled 2018-11-05 (×7): qty 0.28

## 2018-11-05 MED ORDER — FENTANYL CITRATE (PF) 250 MCG/5ML IJ SOLN
INTRAMUSCULAR | Status: AC
  Filled 2018-11-05: qty 5

## 2018-11-05 MED ORDER — HEPARIN SODIUM (PORCINE) 1000 UNIT/ML IJ SOLN
INTRAMUSCULAR | Status: DC | PRN
  Administered 2018-11-05: 9000 [IU] via INTRAVENOUS

## 2018-11-05 MED ORDER — ONDANSETRON HCL 4 MG/2ML IJ SOLN
INTRAMUSCULAR | Status: DC | PRN
  Administered 2018-11-05: 4 mg via INTRAVENOUS

## 2018-11-05 MED ORDER — SODIUM CHLORIDE 0.9 % IV SOLN
INTRAVENOUS | Status: DC | PRN
  Administered 2018-11-05: 500 mL

## 2018-11-05 MED ORDER — ACETAMINOPHEN 10 MG/ML IV SOLN
INTRAVENOUS | Status: AC
  Administered 2018-11-05: 1000 mg
  Filled 2018-11-05: qty 100

## 2018-11-05 MED ORDER — ONDANSETRON HCL 4 MG/2ML IJ SOLN: 4.0000 mg | Freq: Once | INTRAMUSCULAR | Status: DC | PRN

## 2018-11-05 MED ORDER — FENTANYL CITRATE (PF) 250 MCG/5ML IJ SOLN
INTRAMUSCULAR | Status: DC | PRN
  Administered 2018-11-05 (×5): 50 ug via INTRAVENOUS

## 2018-11-05 MED ORDER — EPHEDRINE 5 MG/ML INJ
INTRAVENOUS | Status: AC
  Filled 2018-11-05: qty 10

## 2018-11-05 MED ORDER — HEPARIN BOLUS VIA INFUSION
4000.0000 [IU] | Freq: Once | INTRAVENOUS | Status: DC
  Filled 2018-11-05: qty 4000

## 2018-11-05 MED ORDER — GABAPENTIN 600 MG PO TABS
600.0000 mg | ORAL_TABLET | Freq: Two times a day (BID) | ORAL | Status: DC
  Administered 2018-11-05 – 2018-11-14 (×18): 600 mg via ORAL
  Filled 2018-11-05 (×18): qty 1

## 2018-11-05 MED ORDER — OXYCODONE HCL 5 MG/5ML PO SOLN: 5.0000 mg | Freq: Once | ORAL | Status: DC | PRN

## 2018-11-05 MED ORDER — IOPAMIDOL (ISOVUE-370) INJECTION 76%
INTRAVENOUS | Status: AC
  Administered 2018-11-05: 100 mL
  Filled 2018-11-05: qty 100

## 2018-11-05 MED ORDER — PROPOFOL 10 MG/ML IV BOLUS
INTRAVENOUS | Status: DC | PRN
  Administered 2018-11-05: 120 mg via INTRAVENOUS

## 2018-11-05 MED ORDER — SUCCINYLCHOLINE CHLORIDE 20 MG/ML IJ SOLN
INTRAMUSCULAR | Status: DC | PRN
  Administered 2018-11-05: 100 mg via INTRAVENOUS

## 2018-11-05 MED ORDER — LIDOCAINE 2% (20 MG/ML) 5 ML SYRINGE
INTRAMUSCULAR | Status: AC
  Filled 2018-11-05: qty 5

## 2018-11-05 MED ORDER — CEFAZOLIN SODIUM 1 G IJ SOLR
INTRAMUSCULAR | Status: AC
  Filled 2018-11-05: qty 20

## 2018-11-05 MED ORDER — DEXTROSE 50 % IV SOLN
INTRAVENOUS | Status: DC | PRN
  Administered 2018-11-05: 12.5 g via INTRAVENOUS

## 2018-11-05 MED ORDER — LACTATED RINGERS IV SOLN
INTRAVENOUS | Status: DC | PRN
  Administered 2018-11-05 (×2): via INTRAVENOUS

## 2018-11-05 SURGICAL SUPPLY — 52 items
BANDAGE ELASTIC 4 VELCRO ST LF (GAUZE/BANDAGES/DRESSINGS) ×3 IMPLANT
BANDAGE ESMARK 6X9 LF (GAUZE/BANDAGES/DRESSINGS) IMPLANT
BNDG ESMARK 6X9 LF (GAUZE/BANDAGES/DRESSINGS)
BNDG GAUZE ELAST 4 BULKY (GAUZE/BANDAGES/DRESSINGS) ×3 IMPLANT
CANISTER SUCT 3000ML PPV (MISCELLANEOUS) ×3 IMPLANT
CATH EMB 3FR 80CM (CATHETERS) ×3 IMPLANT
CATH EMB 4FR 80CM (CATHETERS) ×3 IMPLANT
CATH EMB 5FR 80CM (CATHETERS) IMPLANT
CLIP VESOCCLUDE MED 24/CT (CLIP) ×3 IMPLANT
CLIP VESOCCLUDE SM WIDE 24/CT (CLIP) ×3 IMPLANT
CONT SPEC 4OZ CLIKSEAL STRL BL (MISCELLANEOUS) ×3 IMPLANT
COVER WAND RF STERILE (DRAPES) ×3 IMPLANT
CUFF TOURNIQUET SINGLE 24IN (TOURNIQUET CUFF) IMPLANT
CUFF TOURNIQUET SINGLE 34IN LL (TOURNIQUET CUFF) IMPLANT
CUFF TOURNIQUET SINGLE 44IN (TOURNIQUET CUFF) IMPLANT
DECANTER SPIKE VIAL GLASS SM (MISCELLANEOUS) IMPLANT
DERMABOND ADVANCED (GAUZE/BANDAGES/DRESSINGS) ×1
DERMABOND ADVANCED .7 DNX12 (GAUZE/BANDAGES/DRESSINGS) ×2 IMPLANT
DRAIN SNY 10X20 3/4 PERF (WOUND CARE) IMPLANT
DRAPE X-RAY CASS 24X20 (DRAPES) IMPLANT
ELECT REM PT RETURN 9FT ADLT (ELECTROSURGICAL) ×3
ELECTRODE REM PT RTRN 9FT ADLT (ELECTROSURGICAL) ×2 IMPLANT
EVACUATOR SILICONE 100CC (DRAIN) IMPLANT
GAUZE SPONGE 4X4 12PLY STRL LF (GAUZE/BANDAGES/DRESSINGS) ×3 IMPLANT
GLOVE BIO SURGEON STRL SZ 6.5 (GLOVE) ×6 IMPLANT
GLOVE BIO SURGEON STRL SZ7.5 (GLOVE) ×3 IMPLANT
GLOVE BIOGEL M 6.5 STRL (GLOVE) ×6 IMPLANT
GLOVE BIOGEL PI IND STRL 7.0 (GLOVE) ×8 IMPLANT
GLOVE BIOGEL PI INDICATOR 7.0 (GLOVE) ×4
GOWN STRL REUS W/ TWL LRG LVL3 (GOWN DISPOSABLE) ×6 IMPLANT
GOWN STRL REUS W/TWL LRG LVL3 (GOWN DISPOSABLE) ×3
KIT BASIN OR (CUSTOM PROCEDURE TRAY) ×3 IMPLANT
KIT TURNOVER KIT B (KITS) ×3 IMPLANT
NS IRRIG 1000ML POUR BTL (IV SOLUTION) ×6 IMPLANT
PACK PERIPHERAL VASCULAR (CUSTOM PROCEDURE TRAY) ×3 IMPLANT
PAD ARMBOARD 7.5X6 YLW CONV (MISCELLANEOUS) ×6 IMPLANT
SET COLLECT BLD 21X3/4 12 (NEEDLE) IMPLANT
SPONGE SURGIFOAM ABS GEL 100 (HEMOSTASIS) IMPLANT
STAPLER VISISTAT 35W (STAPLE) ×3 IMPLANT
STOPCOCK 4 WAY LG BORE MALE ST (IV SETS) IMPLANT
SUT PROLENE 5 0 C 1 24 (SUTURE) ×3 IMPLANT
SUT PROLENE 6 0 CC (SUTURE) ×9 IMPLANT
SUT PROLENE 7 0 BV 1 (SUTURE) ×6 IMPLANT
SUT VIC AB 2-0 CTX 36 (SUTURE) ×3 IMPLANT
SUT VIC AB 3-0 SH 27 (SUTURE) ×1
SUT VIC AB 3-0 SH 27X BRD (SUTURE) ×2 IMPLANT
SYR 3ML LL SCALE MARK (SYRINGE) ×6 IMPLANT
TOWEL GREEN STERILE (TOWEL DISPOSABLE) ×3 IMPLANT
TRAY FOLEY MTR SLVR 16FR STAT (SET/KITS/TRAYS/PACK) ×3 IMPLANT
TUBING EXTENTION W/L.L. (IV SETS) IMPLANT
UNDERPAD 30X30 (UNDERPADS AND DIAPERS) ×3 IMPLANT
WATER STERILE IRR 1000ML POUR (IV SOLUTION) ×3 IMPLANT

## 2018-11-05 NOTE — Transfer of Care (Signed)
Immediate Anesthesia Transfer of Care Note  Patient: Gabriella [name]  Procedure(s) Performed: EMBOLECTOMY LEFT POPLITEAL ARTERY (Left Leg Upper) Patch Angioplasty LEFT POPLITEAL ARTERY (Left ) Four compartment Fasciotomy left lower leg (Left )  Patient Location: PACU  Anesthesia Type:General  Level of Consciousness: awake  Airway & Oxygen Therapy: Patient Spontanous Breathing and Patient connected to nasal cannula oxygen  Post-op Assessment: Report given to RN and Post -op Vital signs reviewed and stable  Post vital signs: Reviewed and stable  Last Vitals:  Vitals Value Taken Time  BP 164/82 11/05/2018 11:02 PM  Temp    Pulse 100 11/05/2018 11:04 PM  Resp 24 11/05/2018 11:04 PM  SpO2 96 % 11/05/2018 11:04 PM  Vitals shown include unvalidated device data.  Last Pain:  Vitals:   11/05/18 1603  TempSrc: Oral  PainSc:       Patients Stated Pain Goal: 0 (11/05/18 0930)  Complications: No apparent anesthesia complications

## 2018-11-05 NOTE — Progress Notes (Signed)
   Subjective: Gabriella [name] reports she had some worsening of abdominal pain when we advanced her diet yesterday.  Pain was worse just after a meal.  She continues to have left calf pain.    Objective:  Vital signs in last 24 hours: Vitals:   11/03/18 2117 11/04/18 1457 11/04/18 2318 11/05/18 0452  BP: (!) 117/56 135/62 137/61 (!) 122/56  Pulse: 99 89 75 81  Resp: 18 18 16 16   Temp: 97.9 F (36.6 C) 98.1 F (36.7 C) 98.8 F (37.1 C) 97.7 F (36.5 C)  TempSrc: Oral Oral Oral Oral  SpO2: 98% 96% 96% 92%  Weight:      Height:        General: Frail appearing female, NAD, resting comfortably Cardiac: RRR, systolic murmur Pulmonary: CTABL, no wheezing or rhonchi Abdomen: Soft, non-tender to palpation, no guarding, +BS Extremity: Lower extremities are warm and symmetric without edema, could not palpate DP and PT pulses bilaterally, exquisite tenderness of left calf   Assessment/Plan:  Active Problems:   Gastroenteritis   AKI (acute kidney injury) (HCC)   Diarrhea   Hyperglycemia  This is a 78 year old female with a history of DM, COPD, HTN, and arthritis who presented with worsening diarrhea, abdominal pain, and generalized weakness. Found to be tachycardic, tachypneic, with soft blood pressures. Labs are significant for a anion gap metabolic acidosis, creatinine 2.2, baseline 0.8, elevated lactic acid of 5.2, and leukocytosis of 26.   Abdominal pain 2/2 gastroenteritis: CT scan showed small bowel enteritis. GI panel is still pending. Leukocytosis has improved and she remains afebrile. Today she reports that she has not had any diarrhea since she has been here, she is still having abdominal pain however this has improved. We will continue supportive care for now. -Continue zofran and fentanyl PRN for pain control -continue diet -Continue maintenance fluids -will get CT angio to evaluate further as pt having continued pain after advancing her diet  Leg cramping: her symptoms are  intermittent and may be related to claudication but seems to be concentrated in the calf -Continue Voltaren gel -Advised patient that she has fentanyl for pain control -Didn't get [place] study for DVT yesterday, DVT US today to rule out DVT   Uncontrolled Diabetes: She is on insulin and metformin at home. She reports that she does not always take her medications. A1c here was 11.5.   -decrease lantus to 28 units daily -Continue novolog 6 units TID WC -SSI  AKI: Resolved with IV fluids  -continue maintenance fluids since CT angio today -Repeat BMP in AM  Candidal intertrigo: -Continue nystatin powder   COPD:  -Continue duonebs PRN  FEN: LR 100cc/hr, replete lytes prn, carb mod VTE ppx: lovenox Code Status: FULL    Dispo: Anticipated discharge is pending clinical improvement.   Gabriella Ingles, MD 11/05/2018, 11:30 AM

## 2018-11-05 NOTE — Progress Notes (Signed)
  Date: 11/05/2018  Patient name: Gabriella [name]  Medical record number: [id number]  Date of birth: 1941-04-10   I have seen and evaluated this patient and I have discussed the plan of care with the house staff. Please see their note for complete details. I concur with their findings with the following additions/corrections:   78 year old woman with a history of DM, COPD, HTN, fibromyalgia, and arthritis, who was admitted with diarrhea, abdominal pain, and generalized weakness.  She was hypotensive and tachycardic on presentation, concerning for septic shock from possible gastroenteritis.  Lactate peaked at 5.2, leukocytosis to 26.  She has improved significantly since then with IV hydration, no further fevers, and leukocytosis has resolved.  She got 1 dose of antibiotics on admission, but we have not continue them since then.  Blood cultures remain negative.  In the past 2 days, she is complained of significant left leg pain, primarily localized in the calf.  On exam, the left leg is slightly cooler than the right (was much cooler yesterday) and has slightly delayed cap refill.  ABIs were abnormal for both legs, worse on the left.  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  She has also continued to complain of postprandial abdominal pain.  Her diarrhea has slowed, but she did have FOBT positive on admission.  Given her PVD in her legs, I am concerned about mesenteric ischemia.  We have ordered a CTA to better evaluate her mesenteric arteries and left leg vasculature.  Jessy Oto, M.D., Ph.D. 11/05/2018, 1:26 PM

## 2018-11-05 NOTE — Op Note (Addendum)
Procedure: Left popliteal and tibial embolectomy, 4 compartment fasciotomy, vein patch angioplasty left popliteal artery  Preoperative diagnosis: Acute ischemia left leg  Postoperative diagnosis: Same  Anesthesia: General  Assistant: Doreatha Massed, PA-C  Operative findings: #1 embolic material from popliteal and anterior tibial artery  Specimens: Embolic material  Operative details: After team informed consent, the patient taken the operating.  The patient is placed in supine position operating table.  After induction of general anesthesia and endotracheal intubation, Foley catheter was placed.  Next patient's entire left lower extremities prepped and draped in usual sterile fashion.  Longitudinal incision was made on the medial aspect of the leg.  The incision was carried into subcutaneous tissues down level of fascia.  The fascia was opened the popliteal space was entered.  Dissection was carried down level popliteal vein.  Several side branches were ligated on this to reflect the posteriorly.  Several branches of the and fibers of the soleus muscle were taken down to provide exposure of the tibial vessels.  The anterior tibial artery and tibioperoneal trunk were dissected free circumferentially and Vesseloops placed around these.  A vessel loop was also placed around the popliteal artery.  The patient was given 9000 units of intravenous heparin.  A longitudinal opening was made in the popliteal artery and a #4 Fogarty catheter was passed proximally up into the popliteal artery and all the way to the level of the groin.  A large chunk of embolic material was removed.  Multiple passes were made until 2 clean passes were obtained.  There is brisk arterial inflow.  At this point a #3 Fogarty catheter was passed down the anterior tibial artery.  A large chunk of embolic debris was also removed from this.  Several passes were made down the anterior tibial artery until 2 clean passes were obtained.   There was good vigorous backbleeding.  I attempted to advance the 3 Fogarty down the tibioperoneal trunk.  However, this was coming off at a very steep right angle and I could not get the catheter to go safely down the artery.  At this point I decided not to pass the catheter further.  A small piece of greater saphenous vein was harvested from the leg and opened longitudinally and sewn on as a patch angioplasty using a running 6-0 Prolene suture.  His prior to completion anastomosis it was 4 by backbled and thoroughly flushed.  Estimates was secured clamps released there is pulsatile flow in the popliteal artery medially.  One repair suture was placed.  Doppler was used to evaluate the foot and there was good dorsalis pedis Doppler and a faint monophasic posterior tibial Doppler signal.  There was a palpable dorsalis pedis pulse.  Hemostasis was obtained.  I then turned my attention to the lateral aspect of the leg.  A fasciotomy was performed of the lateral and anterior compartments and there was minimal swelling here so I reclosed the skin with staples.  The posterior superficial and deep compartments had been decompressed through the medial leg incision.  The muscle was all viable and pink.  The medial incision was closed with a running 3-0 Vicryl suture in the subcutaneous layer and staples in the skin.  The patient tolerated procedure well and there were no complications.  The sponge and needle counts correct in the case.  The patient was taken the recovery room stable condition.  Fabienne Bruns, MD [facility] Office: [phone] Pager: [phone]

## 2018-11-05 NOTE — Anesthesia Preprocedure Evaluation (Addendum)
Anesthesia Evaluation  Patient identified by MRN, date of birth, ID band Patient awake    Reviewed: Allergy & Precautions, NPO status , Patient's Chart, lab work & pertinent test results  Airway Mallampati: III  TM Distance: >3 FB Neck ROM: Full    Dental  (+) Teeth Intact, Dental Advisory Given   Pulmonary    breath sounds clear to auscultation       Cardiovascular hypertension,  Rhythm:Regular Rate:Normal     Neuro/Psych    GI/Hepatic   Endo/Other  diabetes  Renal/GU      Musculoskeletal   Abdominal (+) + obese,   Peds  Hematology   Anesthesia Other Findings   Reproductive/Obstetrics                             Anesthesia Physical Anesthesia Plan  ASA: III and emergent  Anesthesia Plan: General   Post-op Pain Management:    Induction: Intravenous  PONV Risk Score and Plan: Ondansetron  Airway Management Planned: Oral ETT  Additional Equipment:   Intra-op Plan:   Post-operative Plan: Extubation in OR  Informed Consent: I have reviewed the patients History and Physical, chart, labs and discussed the procedure including the risks, benefits and alternatives for the proposed anesthesia with the patient or authorized representative who has indicated his/her understanding and acceptance.     Dental advisory given  Plan Discussed with: CRNA and Anesthesiologist  Anesthesia Plan Comments: (Probable L. Popliteal and DP artery emboli Type 2 DM poorly controlled CBG 126 Hgb A1C 11.5 Hypertension ? 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

## 2018-11-05 NOTE — Progress Notes (Signed)
Pt received 9 units of insulin as coverage for dinner. Pt was then placed on NPO for embolectomy and did not eat her meal. Night shift nurse notified. Oswaldo Done the charge nurse down in the OR also notified.

## 2018-11-05 NOTE — Progress Notes (Signed)
VASCULAR LAB PRELIMINARY  PRELIMINARY  PRELIMINARY  PRELIMINARY  Left lower extremity venous duplex completed.    Preliminary report:  See CV Proc  Called results to Dr. Dietrich Pates, [facility], RVT 11/05/2018, 12:56 PM

## 2018-11-05 NOTE — Progress Notes (Signed)
ANTICOAGULATION CONSULT NOTE - Initial Consult  Pharmacy Consult for heparin  Indication: DVT  Allergies  Allergen Reactions  . Other Diarrhea, Nausea And Vomiting, Anxiety and Other (See Comments)    Unknown pain medication: caused sweating, nervousness, and "Some new pain med given at [facility], but I don't know what it's called." Patient states "it knocked me out and I got sweaty and they gave me benadryl."     Patient Measurements: Height: 5\' 3"  (160 cm) Weight: 197 lb 8.5 oz (89.6 kg) IBW/kg (Calculated) : 52.4 Heparin Dosing Weight: 73kg  Vital Signs: Temp: 98 F (36.7 C) (02/16 1603) Temp Source: Oral (02/16 1603) BP: 148/67 (02/16 1603) Pulse Rate: 88 (02/16 1603)  Labs: Recent Labs    11/02/18 2100  11/02/18 2251 11/03/18 0945 11/03/18 1108 11/04/18 0513 11/05/18 0520  HGB  --    < > 13.7  --  11.9* 10.8* 11.0*  HCT  --    < > 42.1  --  36.3 31.8* 33.7*  PLT  --    < > 165  --  181 159 162  APTT 129*  --   --   --   --   --   --   LABPROT >90.0*  --  14.6  --   --   --   --   INR >10.00*  --  1.15  --   --   --   --   CREATININE  --    < > 1.73* 1.51*  --  1.10* 0.87   < > = values in this interval not displayed.    Estimated Creatinine Clearance: 59.4 mL/min (by C-G formula based on SCr of 0.87 mg/dL).   Medical History: Past Medical History:  Diagnosis Date  . Arthritis   . COPD (chronic obstructive pulmonary disease) (HCC)   . Diabetes mellitus without complication (HCC)   . Hypertension     Medications:  Medications Prior to Admission  Medication Sig Dispense Refill Last Dose  . acetaminophen (TYLENOL) 650 MG CR tablet Take 650 mg by mouth See admin instructions. Take 650 mg by mouth two times a day and an additional 650 mg at midday as needed for breakthrough pain   11/01/2018 at pm  . albuterol (PROVENTIL HFA) 108 (90 Base) MCG/ACT inhaler Inhale 2 puffs into the lungs every 6 (six) hours as needed for wheezing or shortness of  breath.    unk at [employer]  . amLODipine (NORVASC) 10 MG tablet Take 10 mg by mouth daily.   11/01/2018 at am  . Artificial Saliva (BIOTENE DRY MOUTH MOISTURIZING) SOLN 1 spray by Mouth Rinse route See admin instructions. Spray in the mouth 6 times a day   unk at unk  . chlorhexidine (PERIDEX) 0.12 % solution 5 mLs See admin instructions. Brush 1 teaspoonful of solution onto teeth and gums after PM mouth care/spit out excess and do not rinse   11/01/2018 at Unknown time  . Cholecalciferol (VITAMIN D3) 1.25 MG (50000 UT) CAPS Take 50,000 Units by mouth every Sunday.   10/22/2018 at Unknown time  . Dextran 70-Hypromellose (ARTIFICIAL TEARS PF OP) Place 1 drop into both eyes 2 (two) times daily.   unk at Unknown time  . diphenhydramine-acetaminophen (TYLENOL PM) 25-500 MG TABS tablet Take 2 tablets by mouth at bedtime.    11/01/2018 at pm  . DULoxetine (CYMBALTA) 60 MG capsule Take 60 mg by mouth daily.   11/01/2018 at am  . Fluticasone-Salmeterol (ADVAIR DISKUS) 250-50  MCG/DOSE AEPB Inhale 1 puff into the lungs 2 (two) times daily.   unk at unk  . gabapentin (NEURONTIN) 600 MG tablet Take 600 mg by mouth 2 (two) times daily.    11/01/2018 at pm  . [EXPIRED] insulin aspart (NOVOLOG FLEXPEN) 100 UNIT/ML FlexPen Inject 10 Units into the skin once as needed (for a BGL of 504).    unk at [employer]  . Insulin Glargine (BASAGLAR KWIKPEN) 100 UNIT/ML SOPN Inject 50 Units into the skin at bedtime.    11/01/2018 at pm  . ipratropium-albuterol (DUONEB) 0.5-2.5 (3) MG/3ML SOLN Take 3 mLs by nebulization every 4 (four) hours as needed. (Patient taking differently: Take 3 mLs by nebulization every 4 (four) hours as needed (for wheezing or shortness of breath). ) 3 mL 0 unk at unk  . lisinopril (PRINIVIL,ZESTRIL) 30 MG tablet Take 30 mg by mouth daily.   11/01/2018 at Unknown time  . Melatonin 10 MG CAPS Take 10 mg by mouth at bedtime.   11/01/2018 at pm  . Menthol, Topical Analgesic, (BIOFREEZE EX) Apply 1 application topically See admin  instructions. Apply to affected areas 2 times a day for pain   unk at unk  . metFORMIN (GLUCOPHAGE) 1000 MG tablet Take 1,000 mg by mouth 2 (two) times daily with a meal.   11/01/2018 at Unknown time  . miconazole (MICRO GUARD) 2 % powder Apply 1 application topically See admin instructions. Apply to skin folds and perineum 2 times a day   unk at unk  . mineral oil-hydrophilic petrolatum (AQUAPHOR) ointment Apply 1 application topically See admin instructions. Apply to dry skin daily and after bathing   unk at unk  . rOPINIRole (REQUIP) 2 MG tablet Take 2 mg by mouth at bedtime.   10/31/2018 at pm  . rosuvastatin (CRESTOR) 20 MG tablet Take 20 mg by mouth at bedtime.   11/01/2018 at pm  . sennosides-docusate sodium (SENOKOT-S) 8.6-50 MG tablet Take 2 tablets by mouth at bedtime.   unk at [employer]  . traZODone (DESYREL) 50 MG tablet Take 50 mg by mouth at bedtime.   11/01/2018 at pm  . Turmeric 500 MG CAPS Take 500 mg by mouth 2 (two) times daily.   11/01/2018 at Unknown time   Scheduled:  . acetaminophen  650 mg Oral Q6H   Or  . acetaminophen  650 mg Rectal Q6H  . DULoxetine  60 mg Oral Daily  . enoxaparin (LOVENOX) injection  40 mg Subcutaneous Q24H  . feeding supplement (GLUCERNA SHAKE)  237 mL Oral BID BM  . gabapentin  600 mg Oral BID  . insulin aspart  0-15 Units Subcutaneous TID WC  . insulin aspart  0-5 Units Subcutaneous QHS  . insulin aspart  6 Units Subcutaneous TID WC  . insulin glargine  28 Units Subcutaneous QHS  . nystatin   Topical BID  . ramelteon  8 mg Oral QHS  . rosuvastatin  20 mg Oral QHS   Infusions:  . lactated ringers 100 mL/hr at 11/05/18 0700    Assessment: Pt was admitted for abd pain and diarrhea. Vascular CT shows embolism. Heparin has been ordered for anticoagulation. Last dose of lovenox was yesterday.   Goal of Therapy:  Heparin level 0.3-0.7 units/ml Monitor platelets by anticoagulation protocol: Yes   Plan:   Heparin bolus 4000 units x1 Heparin infusion  1100 units/hr Daily hep level and CBC  Ulyses Southward, PharmD, BCIDP, AAHIVP, CPP Infectious Disease Pharmacist 11/05/2018 7:24 PM

## 2018-11-05 NOTE — Consult Note (Signed)
Referring Physician: Dr Sandre Kitty  Patient name: Gabriella [name] MRN: [id number] DOB: [birth year] Sex: female  REASON FOR CONSULT: left leg embolus  HPI: Gabriella [name] is a 78 y.o. female, 5 day history of left calf and foot pain.  This has been progressively worse over the last few days.  She states that now it is almost continuous.  The pain was sudden onset.  She denies history of afib.  She was admitted with abdominal pain nausea diarrhea and her left leg pain.  ABI yesterday was 0.7.  CTA today shows popliteal and DP embolic material left leg.  No prior history of claudication or non healing wounds.  She is a non smoker.  She ate lunch today at 2 pm.  No real atherosclerosis otherwise. Other medical problems include diabetes and hypertension which have been stable. She thinks she may have had a stroke many years ago causing right eyelid droop.  Past Medical History:  Diagnosis Date  . Arthritis   . COPD (chronic obstructive pulmonary disease) (HCC)   . Diabetes mellitus without complication (HCC)   . Hypertension    Past Surgical History:  Procedure Laterality Date  . CATARACT EXTRACTION Right 11/2017    No family history on file.  SOCIAL HISTORY: Social History   Socioeconomic History  . Marital status: Widowed    Spouse name: Not on file  . Number of children: Not on file  . Years of education: Not on file  . Highest education level: Not on file  Occupational History  . Not on file  Social Needs  . Financial resource strain: Not on file  . Food insecurity:    Worry: Not on file    Inability: Not on file  . Transportation needs:    Medical: Not on file    Non-medical: Not on file  Tobacco Use  . Smoking status: Passive Smoke Exposure - Never Smoker  . Smokeless tobacco: Never Used  Substance and Sexual Activity  . Alcohol use: No    Frequency: Never  . Drug use: No  . Sexual activity: Never  Lifestyle  . Physical activity:    Days per week: Not on file    Minutes  per session: Not on file  . Stress: Not on file  Relationships  . Social connections:    Talks on phone: Not on file    Gets together: Not on file    Attends religious service: Not on file    Active member of club or organization: Not on file    Attends meetings of clubs or organizations: Not on file    Relationship status: Not on file  . Intimate partner violence:    Fear of current or ex partner: Not on file    Emotionally abused: Not on file    Physically abused: Not on file    Forced sexual activity: Not on file  Other Topics Concern  . Not on file  Social History Narrative  . Not on file    Allergies  Allergen Reactions  . Other Diarrhea, Nausea And Vomiting, Anxiety and Other (See Comments)    Unknown pain medication: caused sweating, nervousness, and "Some new pain med given at [facility], but I don't know what it's called." Patient states "it knocked me out and I got sweaty and they gave me benadryl."     Current Facility-Administered Medications  Medication Dose Route Frequency Provider Last Rate Last Dose  . acetaminophen (TYLENOL) tablet 650 mg  650  mg Oral Q6H Angelita InglesWinfrey, William B, MD   650 mg at 11/05/18 1830   Or  . acetaminophen (TYLENOL) suppository 650 mg  650 mg Rectal Q6H Angelita InglesWinfrey, William B, MD      . diclofenac sodium (VOLTAREN) 1 % transdermal gel 2 g  2 g Topical QID PRN Gwyneth RevelsKrienke, Marissa M, MD      . DULoxetine (CYMBALTA) DR capsule 60 mg  60 mg Oral Daily Angelita InglesWinfrey, William B, MD   60 mg at 11/05/18 [id number]  . enoxaparin (LOVENOX) injection 40 mg  40 mg Subcutaneous Q24H Angelita InglesWinfrey, William B, MD   40 mg at 11/04/18 2111  . feeding supplement (GLUCERNA SHAKE) (GLUCERNA SHAKE) liquid 237 mL  237 mL Oral BID BM Angelita InglesWinfrey, William B, MD   237 mL at 11/05/18 1428  . fentaNYL (SUBLIMAZE) injection 25-50 mcg  25-50 mcg Intravenous Q2H PRN Angelita InglesWinfrey, William B, MD   25 mcg at 11/05/18 1142  . gabapentin (NEURONTIN) tablet 600 mg  600 mg Oral BID Angelita InglesWinfrey, William B, MD    600 mg at 11/05/18 1144  . insulin aspart (novoLOG) injection 0-15 Units  0-15 Units Subcutaneous TID WC Angelita InglesWinfrey, William B, MD   3 Units at 11/05/18 1831  . insulin aspart (novoLOG) injection 0-5 Units  0-5 Units Subcutaneous QHS Angelita InglesWinfrey, William B, MD   2 Units at 11/04/18 2313  . insulin aspart (novoLOG) injection 6 Units  6 Units Subcutaneous TID WC Angelita InglesWinfrey, William B, MD   6 Units at 11/05/18 1831  . insulin glargine (LANTUS) injection 28 Units  28 Units Subcutaneous QHS Chana BodeWinfrey, William B, MD      . ipratropium-albuterol (DUONEB) 0.5-2.5 (3) MG/3ML nebulizer solution 3 mL  3 mL Nebulization Q6H PRN Claudean SeveranceKrienke, Marissa M, MD      . lactated ringers infusion   Intravenous Continuous Angelita InglesWinfrey, William B, MD 100 mL/hr at 11/05/18 0700    . nystatin (MYCOSTATIN/NYSTOP) topical powder   Topical BID Claudean SeveranceKrienke, Marissa M, MD      . ondansetron [facility](ZOFRAN) tablet 4 mg  4 mg Oral Q6H PRN Angelita InglesWinfrey, William B, MD       Or  . ondansetron [facility](ZOFRAN) injection 4 mg  4 mg Intravenous Q6H PRN Angelita InglesWinfrey, William B, MD   4 mg at 11/05/18 0457  . ramelteon (ROZEREM) tablet 8 mg  8 mg Oral QHS Angelita InglesWinfrey, William B, MD   8 mg at 11/04/18 2111  . rosuvastatin (CRESTOR) tablet 20 mg  20 mg Oral QHS Angelita InglesWinfrey, William B, MD   20 mg at 11/04/18 2111    ROS:   General:  No weight loss, Fever, chills  HEENT: No recent headaches, no nasal bleeding, no visual changes, no sore throat  Neurologic: No dizziness, blackouts, seizures. No recent symptoms of stroke or mini- stroke. No recent episodes of slurred speech, or temporary blindness.  Cardiac: No recent episodes of chest pain/pressure, no shortness of breath at rest.  No shortness of breath with exertion.  Denies history of atrial fibrillation or irregular heartbeat  Vascular: No history of rest pain in feet.  No history of claudication.  No history of non-healing ulcer, No history of DVT   Pulmonary: No home oxygen, no productive cough, no hemoptysis,  No asthma or  wheezing  Musculoskeletal:  [ ]  Arthritis, [ ]  Low back pain,  [ ]  Joint pain  Hematologic:No history of hypercoagulable state.  No history of easy bleeding.  No history of anemia  Gastrointestinal: No hematochezia or melena,  No gastroesophageal  reflux, no trouble swallowing  Urinary: [ ]  chronic Kidney disease, [ ]  on HD - [ ]  MWF or [ ]  TTHS, [ ]  Burning with urination, [ ]  Frequent urination, [ ]  Difficulty urinating;   Skin: No rashes  Psychological: No history of anxiety,  No history of depression   Physical Examination  Vitals:   11/04/18 1457 11/04/18 2318 11/05/18 0452 11/05/18 1603  BP: 135/62 137/61 (!) 122/56 (!) 148/67  Pulse: 89 75 81 88  Resp: 18 16 16 18   Temp: 98.1 F (36.7 C) 98.8 F (37.1 C) 97.7 F (36.5 C) 98 F (36.7 C)  TempSrc: Oral Oral Oral Oral  SpO2: 96% 96% 92% 96%  Weight:      Height:        Body mass index is 34.99 kg/m.  General:  Alert and oriented, no acute distress HEENT: Normal Neck: No JVD Pulmonary: Clear to auscultation bilaterally Cardiac: Regular Rate and Rhythm  Abdomen: Soft, mild diffuse tender, non-distended, no mass Skin: No rash Extremity Pulses:  2+ radial, brachial, femoral, 2+ right DP absent left dorsalis pedis, posterior tibial pulses bilaterally Musculoskeletal: No deformity or edema, left calf is painful to touch as well as foot  Neurologic: Upper and lower extremity motor 5/5 and symmetric  DATA:  CTA reviewed embolic material popliteal DP left ow vessels open  ASSESSMENT:  Acute ischemia left leg unknown source   PLAN:  To OR emergently for embolectomy possible fasciotomy.  Risk benefits possible complications procedure details discussed with pt who wishes to proceed. Heparin drip ECHO and EKG to eval cardiac source Thoracic CT was already ordered by primary service   Fabienne Bruns, MD [facility] [place] Office: [phone] Pager: [phone]

## 2018-11-05 NOTE — Anesthesia Postprocedure Evaluation (Signed)
Anesthesia Post Note  Patient: Janijah Vella  Procedure(s) Performed: EMBOLECTOMY LEFT POPLITEAL ARTERY (Left Leg Upper) Patch Angioplasty LEFT POPLITEAL ARTERY (Left ) Four compartment Fasciotomy left lower leg (Left )     Patient location during evaluation: PACU Anesthesia Type: General Level of consciousness: awake and alert Pain management: pain level controlled Vital Signs Assessment: post-procedure vital signs reviewed and stable Respiratory status: spontaneous breathing, nonlabored ventilation, respiratory function stable and patient connected to nasal cannula oxygen Cardiovascular status: blood pressure returned to baseline and stable Postop Assessment: no apparent nausea or vomiting Anesthetic complications: no    Last Vitals:  Vitals:   11/05/18 2339 11/05/18 2347  BP: (!) 168/81 (!) 167/72  Pulse: (!) 101 (!) 101  Resp: 12 14  Temp:    SpO2: 96% 97%    Last Pain:  Vitals:   11/05/18 1603  TempSrc: Oral  PainSc:                  Jaymond Waage COKER

## 2018-11-05 NOTE — Anesthesia Procedure Notes (Signed)
Procedure Name: Intubation Date/Time: 11/05/2018 8:46 PM Performed by: Claudina Lick, CRNA Pre-anesthesia Checklist: Patient identified, Emergency Drugs available, Suction available, Patient being monitored and Timeout performed Patient Re-evaluated:Patient Re-evaluated prior to induction Oxygen Delivery Method: Circle system utilized Preoxygenation: Pre-oxygenation with 100% oxygen Induction Type: IV induction, Rapid sequence and Cricoid Pressure applied Laryngoscope Size: Miller and 2 Grade View: Grade I Tube type: Oral Tube size: 7.5 mm Number of attempts: 1 Airway Equipment and Method: Stylet Placement Confirmation: ETT inserted through vocal cords under direct vision,  positive ETCO2 and breath sounds checked- equal and bilateral Secured at: 22 cm Tube secured with: Tape Dental Injury: Teeth and Oropharynx as per pre-operative assessment

## 2018-11-06 ENCOUNTER — Encounter (HOSPITAL_COMMUNITY): Payer: Self-pay | Admitting: Vascular Surgery

## 2018-11-06 ENCOUNTER — Other Ambulatory Visit (HOSPITAL_COMMUNITY): Payer: Medicare (Managed Care)

## 2018-11-06 ENCOUNTER — Inpatient Hospital Stay (HOSPITAL_COMMUNITY): Payer: Medicare (Managed Care)

## 2018-11-06 DIAGNOSIS — M79A22 Nontraumatic compartment syndrome of left lower extremity: Secondary | ICD-10-CM

## 2018-11-06 DIAGNOSIS — I70213 Atherosclerosis of native arteries of extremities with intermittent claudication, bilateral legs: Secondary | ICD-10-CM

## 2018-11-06 LAB — COMPREHENSIVE METABOLIC PANEL
ALT: 104 U/L — ABNORMAL HIGH (ref 0–44)
ANION GAP: 9 (ref 5–15)
AST: 117 U/L — ABNORMAL HIGH (ref 15–41)
Albumin: 2.2 g/dL — ABNORMAL LOW (ref 3.5–5.0)
Alkaline Phosphatase: 39 U/L (ref 38–126)
BUN: 10 mg/dL (ref 8–23)
CO2: 27 mmol/L (ref 22–32)
Calcium: 8.5 mg/dL — ABNORMAL LOW (ref 8.9–10.3)
Chloride: 103 mmol/L (ref 98–111)
Creatinine, Ser: 0.84 mg/dL (ref 0.44–1.00)
GFR calc Af Amer: >60 mL/min (ref >60)
GFR calc non Af Amer: >60 mL/min (ref >60)
Glucose, Bld: 92 mg/dL (ref 70–99)
Potassium: 3.9 mmol/L (ref 3.5–5.1)
Sodium: 139 mmol/L (ref 135–145)
Total Bilirubin: 0.3 mg/dL (ref 0.3–1.2)
Total Protein: 5.2 g/dL — ABNORMAL LOW (ref 6.5–8.1)

## 2018-11-06 LAB — GLUCOSE, CAPILLARY
GLUCOSE-CAPILLARY: 218 mg/dL — AB (ref 70–99)
Glucose-Capillary: 138 mg/dL — ABNORMAL HIGH (ref 70–99)
Glucose-Capillary: 158 mg/dL — ABNORMAL HIGH (ref 70–99)
Glucose-Capillary: 87 mg/dL (ref 70–99)
Glucose-Capillary: 92 mg/dL (ref 70–99)

## 2018-11-06 LAB — HEPARIN LEVEL (UNFRACTIONATED)
Heparin Unfractionated: 0.39 IU/mL (ref 0.30–0.70)
Heparin Unfractionated: 0.16 IU/mL — ABNORMAL LOW (ref 0.30–0.70)

## 2018-11-06 LAB — CBC
HCT: 33.6 % — ABNORMAL LOW (ref 36.0–46.0)
Hemoglobin: 11.1 g/dL — ABNORMAL LOW (ref 12.0–15.0)
MCH: 29.9 pg (ref 26.0–34.0)
MCHC: 33 g/dL (ref 30.0–36.0)
MCV: 90.6 fL (ref 80.0–100.0)
PLATELETS: 158 10*3/uL (ref 150–400)
RBC: 3.71 MIL/uL — AB (ref 3.87–5.11)
RDW: 12.7 % (ref 11.5–15.5)
WBC: 12.2 10*3/uL — ABNORMAL HIGH (ref 4.0–10.5)
nRBC: 0 % (ref 0.0–0.2)

## 2018-11-06 MED ORDER — PERFLUTREN LIPID MICROSPHERE
1.0000 mL | INTRAVENOUS | Status: AC | PRN
  Administered 2018-11-06: 10 mL via INTRAVENOUS
  Filled 2018-11-06: qty 10

## 2018-11-06 MED ORDER — OXYCODONE-ACETAMINOPHEN 5-325 MG PO TABS
1.0000 | ORAL_TABLET | ORAL | Status: DC | PRN
  Administered 2018-11-06 – 2018-11-09 (×13): 2 via ORAL
  Filled 2018-11-06 (×14): qty 2

## 2018-11-06 MED ORDER — PANTOPRAZOLE SODIUM 40 MG PO TBEC
40.0000 mg | DELAYED_RELEASE_TABLET | Freq: Every day | ORAL | Status: DC
  Administered 2018-11-06 – 2018-11-14 (×9): 40 mg via ORAL
  Filled 2018-11-06 (×9): qty 1

## 2018-11-06 MED ORDER — PHENOL 1.4 % MT LIQD: 1.0000 | OROMUCOSAL | Status: DC | PRN

## 2018-11-06 MED ORDER — DOCUSATE SODIUM 100 MG PO CAPS
100.0000 mg | ORAL_CAPSULE | Freq: Every day | ORAL | Status: DC
  Administered 2018-11-06 – 2018-11-14 (×9): 100 mg via ORAL
  Filled 2018-11-06 (×9): qty 1

## 2018-11-06 MED ORDER — SODIUM CHLORIDE 0.9 % IV SOLN: 500.0000 mL | Freq: Once | INTRAVENOUS | Status: DC | PRN

## 2018-11-06 MED ORDER — CEFAZOLIN SODIUM-DEXTROSE 2-4 GM/100ML-% IV SOLN
2.0000 g | Freq: Three times a day (TID) | INTRAVENOUS | Status: AC
  Administered 2018-11-06 (×2): 2 g via INTRAVENOUS
  Filled 2018-11-06 (×2): qty 100

## 2018-11-06 MED FILL — Perflutren Lipid Microsphere IV Susp 1.1 MG/ML: INTRAVENOUS | Qty: 10 | Status: AC

## 2018-11-06 NOTE — Progress Notes (Signed)
Occupational Therapy Treatment Patient Details Name: Gabriella [name] MRN: [id number] DOB: 22-Dec-1940 Today's Date: 11/06/2018    History of present illness Gabriella [name] is a 78 year old woman with pMH of HTN, DM2, COPD, arthritis who presented with a diarrheal illness, guaiac + stool and abdominal pain.  She was dizzy and weak at home, she was noted to have enteritis on CT scan.  She also reported severe left leg pain. She met sepsis criteria. She is [facility] in the [facility]. Pt s/p Left popliteal and tibial embolectomy, 4 compartment fasciotomy, vein patch angioplasty left popliteal artery 11/05/2018.   OT comments  [name] admitted and underwent above presents to acute OT with increased pain in LLE due to surgery yesterday thus affecting her safety and independence with basic ADLs. She was however able to mobilize out of bed better today and transfer to recliner with +1 Mod A thus decreasing burden of care for basic ADLs. She will continue to benefit from acute OT with follow up OT at SNF.  Follow Up Recommendations  SNF;Supervision/Assistance - 24 hour    Equipment Recommendations  Other (comment)(TBD at SNF)       Precautions / Restrictions Precautions Precautions: Fall Precaution Comments: LLE sx 11/05/2018 with fasciotomy x4 Restrictions Weight Bearing Restrictions: No       Mobility Bed Mobility Overal bed mobility: Needs Assistance Bed Mobility: Supine to Sit     Supine to sit: Min assist     General bed mobility comments: increased time, use of bed rail, HOB up  Transfers Overall transfer level: Needs assistance Equipment used: None Transfers: Sit to/from [facility] Sit to Stand: Mod assist Stand pivot transfers: Mod assist       General transfer comment: with therapist standing in front of her holding gait belt with pt "dance stepping" with therapist to recliner holding onto therapists forearms    Balance Overall balance assessment: Needs  assistance;History of Falls Sitting-balance support: Feet supported;No upper extremity supported Sitting balance-Leahy Scale: Good     Standing balance support: Bilateral upper extremity supported;During functional activity Standing balance-Leahy Scale: Poor Standing balance comment: dependent on BUE                           ADL either performed or assessed with clinical judgement   ADL Overall ADL's : Needs assistance/impaired Eating/Feeding: Independent;Sitting   Grooming: Set up;Sitting                   Toilet Transfer: Moderate assistance;Stand-pivot Toilet Transfer Details (indicate cue type and reason): Bed>recliner going to right                 Vision Patient Visual Report: No change from baseline            Cognition Arousal/Alertness: Awake/alert Behavior During Therapy: WFL for tasks assessed/performed Overall Cognitive Status: No family/caregiver present to determine baseline cognitive functioning                                 General Comments: followed all 1 step commands                   Pertinent Vitals/ Pain       Pain Assessment: 0-10 Faces Pain Scale: Hurts worst Pain Location: LLE (knee and below) Pain Descriptors / Indicators: Aching;Sore Pain Intervention(s): Limited activity within patient's tolerance;Monitored  during session;Patient requesting pain meds-RN notified;RN gave pain meds during session;Repositioned         Frequency  Min 2X/week        Progress Toward Goals  OT Goals(current goals can now be found in the care plan section)  Progress towards OT goals: Progressing toward goals     Plan Discharge plan remains appropriate;Frequency remains appropriate       AM-PAC OT "6 Clicks" Daily Activity     Outcome Measure   Help from another person eating meals?: None Help from another person taking care of personal grooming?: A Little Help from another person toileting, which  includes using toliet, bedpan, or urinal?: A Lot Help from another person bathing (including washing, rinsing, drying)?: A Lot Help from another person to put on and taking off regular upper body clothing?: A Little Help from another person to put on and taking off regular lower body clothing?: Total 6 Click Score: 15    End of Session Equipment Utilized During Treatment: Gait belt  OT Visit Diagnosis: Unsteadiness on feet (R26.81);Other abnormalities of gait and mobility (R26.89);Pain;History of falling (Z91.81);Muscle weakness (generalized) (M62.81) Pain - Right/Left: Left Pain - part of body: Leg   Activity Tolerance Patient tolerated treatment well   Patient Left in chair;with call bell/phone within reach;with chair alarm set   Nurse Communication Mobility status(NT also)        Time: 6606-3016 OT Time Calculation (min): 33 min  Charges: OT General Charges $OT Visit: 1 Visit OT Treatments $Self Care/Home Management : 23-37 mins  Golden Circle, OTR/L Acute [employer] Pager [phone] Office [phone]      Almon Register 11/06/2018, 9:36 AM

## 2018-11-06 NOTE — Progress Notes (Signed)
ANTICOAGULATION CONSULT NOTE   Pharmacy Consult for heparin  Indication: DVT  Allergies  Allergen Reactions  . Other Diarrhea, Nausea And Vomiting, Anxiety and Other (See Comments)    Unknown pain medication: caused sweating, nervousness, and "Some new pain med given at [facility], but I don't know what it's called." Patient states "it knocked me out and I got sweaty and they gave me benadryl."     Patient Measurements: Height: 5\' 3"  (160 cm) Weight: 202 lb 9.6 oz (91.9 kg) IBW/kg (Calculated) : 52.4 Heparin Dosing Weight: 73kg  Vital Signs: Temp: 97.5 F (36.4 C) (02/17 0821) Temp Source: Oral (02/17 0821) BP: 133/64 (02/17 0821) Pulse Rate: 83 (02/17 0821)  Labs: Recent Labs    11/04/18 0513 11/05/18 0520 11/06/18 0406 11/06/18 0950  HGB 10.8* 11.0* 11.1*  --   HCT 31.8* 33.7* 33.6*  --   PLT 159 162 158  --   HEPARINUNFRC  --   --   --  0.39  CREATININE 1.10* 0.87 0.84  --     Estimated Creatinine Clearance: 62.3 mL/min (by C-G formula based on SCr of 0.84 mg/dL).   Medical History: Past Medical History:  Diagnosis Date  . Arthritis   . COPD (chronic obstructive pulmonary disease) (HCC)   . Diabetes mellitus without complication (HCC)   . Hypertension     Medications:  Medications Prior to Admission  Medication Sig Dispense Refill Last Dose  . acetaminophen (TYLENOL) 650 MG CR tablet Take 650 mg by mouth See admin instructions. Take 650 mg by mouth two times a day and an additional 650 mg at midday as needed for breakthrough pain   11/01/2018 at pm  . albuterol (PROVENTIL HFA) 108 (90 Base) MCG/ACT inhaler Inhale 2 puffs into the lungs every 6 (six) hours as needed for wheezing or shortness of breath.    unk at [employer]  . amLODipine (NORVASC) 10 MG tablet Take 10 mg by mouth daily.   11/01/2018 at am  . Artificial Saliva (BIOTENE DRY MOUTH MOISTURIZING) SOLN 1 spray by Mouth Rinse route See admin instructions. Spray in the mouth 6 times a day   unk at  unk  . chlorhexidine (PERIDEX) 0.12 % solution 5 mLs See admin instructions. Brush 1 teaspoonful of solution onto teeth and gums after PM mouth care/spit out excess and do not rinse   11/01/2018 at Unknown time  . Cholecalciferol (VITAMIN D3) 1.25 MG (50000 UT) CAPS Take 50,000 Units by mouth every Sunday.   10/22/2018 at Unknown time  . Dextran 70-Hypromellose (ARTIFICIAL TEARS PF OP) Place 1 drop into both eyes 2 (two) times daily.   unk at Unknown time  . diphenhydramine-acetaminophen (TYLENOL PM) 25-500 MG TABS tablet Take 2 tablets by mouth at bedtime.    11/01/2018 at pm  . DULoxetine (CYMBALTA) 60 MG capsule Take 60 mg by mouth daily.   11/01/2018 at am  . Fluticasone-Salmeterol (ADVAIR DISKUS) 250-50 MCG/DOSE AEPB Inhale 1 puff into the lungs 2 (two) times daily.   unk at unk  . gabapentin (NEURONTIN) 600 MG tablet Take 600 mg by mouth 2 (two) times daily.    11/01/2018 at pm  . [EXPIRED] insulin aspart (NOVOLOG FLEXPEN) 100 UNIT/ML FlexPen Inject 10 Units into the skin once as needed (for a BGL of 504).    unk at [employer]  . Insulin Glargine (BASAGLAR KWIKPEN) 100 UNIT/ML SOPN Inject 50 Units into the skin at bedtime.    11/01/2018 at pm  . ipratropium-albuterol (DUONEB) 0.5-2.5 (  3) MG/3ML SOLN Take 3 mLs by nebulization every 4 (four) hours as needed. (Patient taking differently: Take 3 mLs by nebulization every 4 (four) hours as needed (for wheezing or shortness of breath). ) 3 mL 0 unk at unk  . lisinopril (PRINIVIL,ZESTRIL) 30 MG tablet Take 30 mg by mouth daily.   11/01/2018 at Unknown time  . Melatonin 10 MG CAPS Take 10 mg by mouth at bedtime.   11/01/2018 at pm  . Menthol, Topical Analgesic, (BIOFREEZE EX) Apply 1 application topically See admin instructions. Apply to affected areas 2 times a day for pain   unk at unk  . metFORMIN (GLUCOPHAGE) 1000 MG tablet Take 1,000 mg by mouth 2 (two) times daily with a meal.   11/01/2018 at Unknown time  . miconazole (MICRO GUARD) 2 % powder Apply 1 application  topically See admin instructions. Apply to skin folds and perineum 2 times a day   unk at unk  . mineral oil-hydrophilic petrolatum (AQUAPHOR) ointment Apply 1 application topically See admin instructions. Apply to dry skin daily and after bathing   unk at unk  . rOPINIRole (REQUIP) 2 MG tablet Take 2 mg by mouth at bedtime.   10/31/2018 at pm  . rosuvastatin (CRESTOR) 20 MG tablet Take 20 mg by mouth at bedtime.   11/01/2018 at pm  . sennosides-docusate sodium (SENOKOT-S) 8.6-50 MG tablet Take 2 tablets by mouth at bedtime.   unk at [employer]  . traZODone (DESYREL) 50 MG tablet Take 50 mg by mouth at bedtime.   11/01/2018 at pm  . Turmeric 500 MG CAPS Take 500 mg by mouth 2 (two) times daily.   11/01/2018 at Unknown time   Scheduled:  . acetaminophen  650 mg Oral Q6H   Or  . acetaminophen  650 mg Rectal Q6H  . docusate sodium  100 mg Oral Daily  . DULoxetine  60 mg Oral Daily  . feeding supplement (GLUCERNA SHAKE)  237 mL Oral BID BM  . gabapentin  600 mg Oral BID  . insulin aspart  0-15 Units Subcutaneous TID WC  . insulin aspart  0-5 Units Subcutaneous QHS  . insulin aspart  6 Units Subcutaneous TID WC  . insulin glargine  28 Units Subcutaneous QHS  . nystatin   Topical BID  . pantoprazole  40 mg Oral Daily  . ramelteon  8 mg Oral QHS  . rosuvastatin  20 mg Oral QHS   Infusions:  . sodium chloride    . heparin 1,100 Units/hr (11/05/18 2317)  . lactated ringers 100 mL/hr at 11/06/18 1055    Assessment: Pt was admitted for abd pain and diarrhea. Vascular CT shows embolism. Heparin has been ordered for anticoagulation. Last dose of lovenox was 2/15.   Initial heparin level at goal (0.39) on 1100 units/hr. Will check confirmatory level tonight. Hgb stable in 11s  Goal of Therapy:  Heparin level 0.3-0.7 units/ml Monitor platelets by anticoagulation protocol: Yes   Plan:  Heparin infusion 1100 units/hr Daily hep level and CBC Follow up plans for oral anticoagulation  Sheppard Coil  PharmD., BCPS Clinical Pharmacist 11/06/2018 1:06 PM

## 2018-11-06 NOTE — Progress Notes (Signed)
ANTICOAGULATION CONSULT NOTE   Pharmacy Consult for heparin  Indication: DVT  Allergies  Allergen Reactions  . Other Diarrhea, Nausea And Vomiting, Anxiety and Other (See Comments)    Unknown pain medication: caused sweating, nervousness, and "Some new pain med given at [facility], but I don't know what it's called." Patient states "it knocked me out and I got sweaty and they gave me benadryl."     Patient Measurements: Height: 5\' 3"  (160 cm) Weight: 202 lb 9.6 oz (91.9 kg) IBW/kg (Calculated) : 52.4 Heparin Dosing Weight: 73kg  Vital Signs: Temp: 98.1 F (36.7 C) (02/17 1500) Temp Source: Oral (02/17 1500) BP: 141/76 (02/17 1500) Pulse Rate: 80 (02/17 1500)  Labs: Recent Labs    11/04/18 0513 11/05/18 0520 11/06/18 0406 11/06/18 0950 11/06/18 1810  HGB 10.8* 11.0* 11.1*  --   --   HCT 31.8* 33.7* 33.6*  --   --   PLT 159 162 158  --   --   HEPARINUNFRC  --   --   --  0.39 0.16*  CREATININE 1.10* 0.87 0.84  --   --     Estimated Creatinine Clearance: 62.3 mL/min (by C-G formula based on SCr of 0.84 mg/dL).   Medical History: Past Medical History:  Diagnosis Date  . Arthritis   . COPD (chronic obstructive pulmonary disease) (HCC)   . Diabetes mellitus without complication (HCC)   . Hypertension     Medications:  Medications Prior to Admission  Medication Sig Dispense Refill Last Dose  . acetaminophen (TYLENOL) 650 MG CR tablet Take 650 mg by mouth See admin instructions. Take 650 mg by mouth two times a day and an additional 650 mg at midday as needed for breakthrough pain   11/01/2018 at pm  . albuterol (PROVENTIL HFA) 108 (90 Base) MCG/ACT inhaler Inhale 2 puffs into the lungs every 6 (six) hours as needed for wheezing or shortness of breath.    unk at [employer]  . amLODipine (NORVASC) 10 MG tablet Take 10 mg by mouth daily.   11/01/2018 at am  . Artificial Saliva (BIOTENE DRY MOUTH MOISTURIZING) SOLN 1 spray by Mouth Rinse route See admin instructions.  Spray in the mouth 6 times a day   unk at unk  . chlorhexidine (PERIDEX) 0.12 % solution 5 mLs See admin instructions. Brush 1 teaspoonful of solution onto teeth and gums after PM mouth care/spit out excess and do not rinse   11/01/2018 at Unknown time  . Cholecalciferol (VITAMIN D3) 1.25 MG (50000 UT) CAPS Take 50,000 Units by mouth every Sunday.   10/22/2018 at Unknown time  . Dextran 70-Hypromellose (ARTIFICIAL TEARS PF OP) Place 1 drop into both eyes 2 (two) times daily.   unk at Unknown time  . diphenhydramine-acetaminophen (TYLENOL PM) 25-500 MG TABS tablet Take 2 tablets by mouth at bedtime.    11/01/2018 at pm  . DULoxetine (CYMBALTA) 60 MG capsule Take 60 mg by mouth daily.   11/01/2018 at am  . Fluticasone-Salmeterol (ADVAIR DISKUS) 250-50 MCG/DOSE AEPB Inhale 1 puff into the lungs 2 (two) times daily.   unk at unk  . gabapentin (NEURONTIN) 600 MG tablet Take 600 mg by mouth 2 (two) times daily.    11/01/2018 at pm  . [EXPIRED] insulin aspart (NOVOLOG FLEXPEN) 100 UNIT/ML FlexPen Inject 10 Units into the skin once as needed (for a BGL of 504).    unk at [employer]  . Insulin Glargine (BASAGLAR KWIKPEN) 100 UNIT/ML SOPN Inject 50 Units into  the skin at bedtime.    11/01/2018 at pm  . ipratropium-albuterol (DUONEB) 0.5-2.5 (3) MG/3ML SOLN Take 3 mLs by nebulization every 4 (four) hours as needed. (Patient taking differently: Take 3 mLs by nebulization every 4 (four) hours as needed (for wheezing or shortness of breath). ) 3 mL 0 unk at unk  . lisinopril (PRINIVIL,ZESTRIL) 30 MG tablet Take 30 mg by mouth daily.   11/01/2018 at Unknown time  . Melatonin 10 MG CAPS Take 10 mg by mouth at bedtime.   11/01/2018 at pm  . Menthol, Topical Analgesic, (BIOFREEZE EX) Apply 1 application topically See admin instructions. Apply to affected areas 2 times a day for pain   unk at unk  . metFORMIN (GLUCOPHAGE) 1000 MG tablet Take 1,000 mg by mouth 2 (two) times daily with a meal.   11/01/2018 at Unknown time  . miconazole  (MICRO GUARD) 2 % powder Apply 1 application topically See admin instructions. Apply to skin folds and perineum 2 times a day   unk at unk  . mineral oil-hydrophilic petrolatum (AQUAPHOR) ointment Apply 1 application topically See admin instructions. Apply to dry skin daily and after bathing   unk at unk  . rOPINIRole (REQUIP) 2 MG tablet Take 2 mg by mouth at bedtime.   10/31/2018 at pm  . rosuvastatin (CRESTOR) 20 MG tablet Take 20 mg by mouth at bedtime.   11/01/2018 at pm  . sennosides-docusate sodium (SENOKOT-S) 8.6-50 MG tablet Take 2 tablets by mouth at bedtime.   unk at [employer]  . traZODone (DESYREL) 50 MG tablet Take 50 mg by mouth at bedtime.   11/01/2018 at pm  . Turmeric 500 MG CAPS Take 500 mg by mouth 2 (two) times daily.   11/01/2018 at Unknown time   Scheduled:  . acetaminophen  650 mg Oral Q6H   Or  . acetaminophen  650 mg Rectal Q6H  . docusate sodium  100 mg Oral Daily  . DULoxetine  60 mg Oral Daily  . feeding supplement (GLUCERNA SHAKE)  237 mL Oral BID BM  . gabapentin  600 mg Oral BID  . insulin aspart  0-15 Units Subcutaneous TID WC  . insulin aspart  0-5 Units Subcutaneous QHS  . insulin aspart  6 Units Subcutaneous TID WC  . insulin glargine  28 Units Subcutaneous QHS  . nystatin   Topical BID  . pantoprazole  40 mg Oral Daily  . ramelteon  8 mg Oral QHS  . rosuvastatin  20 mg Oral QHS   Infusions:  . sodium chloride    . heparin 1,100 Units/hr (11/05/18 2317)  . lactated ringers 100 mL/hr at 11/06/18 1717    Assessment: Pt was admitted for abd pain and diarrhea. Vascular CT shows embolism. Heparin has been ordered for anticoagulation. Last dose of lovenox was 2/15.   Heparin level dropped to 0.16 this PM  Goal of Therapy:  Heparin level 0.3-0.7 units/ml Monitor platelets by anticoagulation protocol: Yes   Plan:  Increase heparin to 1250 units / hr Daily hep level and CBC Follow up plans for oral anticoagulation  Thank you Okey Regal,  PharmD [phone] 11/06/2018 6:54 PM

## 2018-11-06 NOTE — Progress Notes (Signed)
PT Cancellation Note  Patient Details Name: Siah [place] MRN: [id number] DOB: 12-23-1940   Cancelled Treatment:    Reason Eval/Treat Not Completed: Other (comment).  Pt was with another therapist and ready to eat breakfast.  Will return at another time.   Ivar Drape 11/06/2018, 9:51 AM   Samul Dada, PT MS Acute Rehab Dept. Number: [facility] [id number] and [facility] [phone]

## 2018-11-06 NOTE — Care Management Important Message (Signed)
Important Message  Patient Details  Name: Gabriella [name] MRN: [id number] Date of Birth: Apr 01, 1941   Medicare Important Message Given:  Yes    Gabriella [name] 11/06/2018, 4:27 PM

## 2018-11-06 NOTE — Progress Notes (Signed)
CSW contacted  [facility]-  SW Leighton Ruff [phone]. [facility] confirm patient more likely to go to SNF verses home once discharged  and will assist in coordinating discharge planning when patient is medically ready.  Antony Blackbird, [facility] Clinical Social Worker [phone]

## 2018-11-06 NOTE — Progress Notes (Signed)
  Echocardiogram 2D Echocardiogram has been performed with Definity.  Gerda Diss 11/06/2018, 1:59 PM

## 2018-11-06 NOTE — Progress Notes (Signed)
Pt received from PACU to 4E21. Initial assessment, vitals and CHG complete. Tele applied CCMD notified. Pt oriented to room and call bell system. Will continue to monitor.

## 2018-11-06 NOTE — Progress Notes (Addendum)
[facility] [place]  Subjective  - Doing OK over all.   Objective 132/62 95 98.3 F (36.8 C) (Oral) 20 98%  Intake/Output Summary (Last 24 hours) at 11/06/2018 0741 Last data filed at 11/06/2018 0037 Gross per 24 hour  Intake 1665 ml  Output 3575 ml  Net -1910 ml    Palpable left DP/AT Clean dressing lower left leg Abdomin soft Heart RRR Lungs non labored breathing  Assessment/Planning: POD # 1  Left popliteal and tibial embolectomy, 4 compartment fasciotomy, vein patch angioplasty left popliteal artery  Patent blood flow with palpable AT/DP Will change dressing tomorrow and plan fasciotomy closures based on muscle appearence. PT/OT eval and treatment Heparin full dose will discuss anticoagulation for discharge planning  Gabriella [name] 11/06/2018 7:41 AM --  2+ DP pulse sore overall Will need oral anticoagulation per primary team Will await echo most likely cardiac source embolus echo and thoracic CT pending  Fabienne Bruns, MD [facility] [place] Office: [phone] Pager: [phone]   Laboratory Lab Results: Recent Labs    11/05/18 0520 11/06/18 0406  WBC 10.2 12.2*  HGB 11.0* 11.1*  HCT 33.7* 33.6*  PLT 162 158   BMET Recent Labs    11/05/18 0520 11/06/18 0406  NA 140 139  K 4.0 3.9  CL 108 103  CO2 25 27  GLUCOSE 80 92  BUN 15 10  CREATININE 0.87 0.84  CALCIUM 8.6* 8.5*    COAG Lab Results  Component Value Date   INR 1.15 11/02/2018   INR >10.00 (HH) 11/02/2018   INR 1.22 11/02/2018   No results found for: PTT

## 2018-11-06 NOTE — Procedures (Signed)
Echo attempted. Patient with OT in chair. Will attempt later.

## 2018-11-06 NOTE — Progress Notes (Signed)
Internal Medicine Attending:   I saw and examined the patient. I reviewed the resident's note and I agree with the resident's findings and plan as documented in the resident's note.  Patient states that her leg feels better today and that her pain is under control.  She denies any abdominal pain currently or further episodes of diarrhea.  Patient initially admitted to the hospital with abdominal pain likely secondary to gastroenteritis complicated by AKI and lactic acidosis.  Patient's leukocytosis is resolving and AKI has resolved.  Repeat CT of her abdomen shows no evidence of gastroenteritis and her symptoms have improved.  Continue with oral intake as tolerated.  No further work-up at this time.  Patient's hospital course was complicated by left lower extremity pain and further work-up revealed arterial emboli the popliteal and tibial arteries.  Patient is status post left popliteal and tibial embolectomy as well as a 4 compartment fasciotomy.  Vascular surgery follow-up and recommendations appreciated.  Continue with heparin drip for now.  We will follow-up 2D echo as well as CTA chest look for embolic source.  Continue with pain control for now.  Will transition patient to oral anticoagulation depending upon the etiology of her arterial emboli.  No further work-up at this time.

## 2018-11-06 NOTE — Progress Notes (Signed)
   Subjective: Ms. Zeidan reports she had a rough night last night, didn't sleep well.  Her leg is overall feeling better, she is getting percocet and fentanyl which she reports helps. Her main concern is breakfast and when she can go home. She has not had any diarrhea and her abdominal pain has improved.   Objective:  Vital signs in last 24 hours: Vitals:   11/06/18 0002 11/06/18 0022 11/06/18 0400 11/06/18 0821  BP: (!) 151/74 (!) 152/69 132/62 133/64  Pulse: 98 98 95 83  Resp: 15 17 20    Temp: 98.1 F (36.7 C) 97.9 F (36.6 C) 98.3 F (36.8 C) (!) 97.5 F (36.4 C)  TempSrc:  Oral Oral Oral  SpO2: 97% 96% 98%   Weight:  91.9 kg    Height:  5\' 3"  (1.6 m)      General: Frail appearing female, NAD, resting comfortably Cardiac: RRR, systolic murmur Pulmonary: CTABL, no wheezing or rhonchi Abdomen: Soft, non-tender to palpation, no guarding, +BS Extremity: Lower extremities are warm and symmetric without edema, DP pulses 2+  bilaterally, left calf in bandage, warm feet   Assessment/Plan:  Principal Problem:   Gastroenteritis Active Problems:   AKI (acute kidney injury) (HCC)   Diarrhea   Hyperglycemia   Peripheral vascular disease (HCC)  This is a 78 year old female with a history of DM, COPD, HTN, and arthritis who presented with worsening diarrhea, abdominal pain, and generalized weakness. Found to be tachycardic, tachypneic, with soft blood pressures. Labs are significant for a anion gap metabolic acidosis, creatinine 2.2, baseline 0.8, elevated lactic acid of 5.2, and leukocytosis of 26.   Abdominal pain 2/2 gastroenteritis: CT scan showed small bowel enteritis. GI panel was negative Leukocytosis has improved and she remains afebrile. CTA abdomen showed improvement in her enteritis, no evidence of mesenteric ischemia, minimal atherosclerosis of aorta. It appears that this is likely 2/2 gastroenteritis.  -Continue zofran and fentanyl PRN for pain control -continue  diet -Continue maintenance fluids  Left LE emboli in popliteal and tibial artery  She was having intermittent leg pains that became constant, decreased pulses and cool extremities. DVT was done that showed no DVT however artery occlusions. CTA showed embolus to LLE and she was taken for an emergent embolectomy. It's unclear where the source is at this time, she has no history of atrial fibrillation or other clotting incidents. She has clinical improvement on exam, warm extremities with 2+ pulses, leg is bandaged up. Advised patient that she has fentanyl and percocet for pain control. -S/p left popliteal and tibial embolectomy, 4 compartment fasciotomy, and vein patch angioplasty of the left popliteal artery on 2/16 -Continue percocet -Continue fentanyl PRN -Continue heparin drip -Vascular surgery following, appreciate recommendations, plan fasciotomy closures based on muscle appearance, will change dressing tomorrow -PT/OT  -Echocardiogram today -CTA chest today  Uncontrolled Diabetes: She is on insulin and metformin at home. She reports that she does not always take her medications. A1c here was 11.5.   -Continue lantus 28 units daily -Continue novolog 6 units TID WC -SSI  AKI: Resolved with IV fluids  -continue maintenance fluids since CT angio today -Repeat BMP in AM  Candidal intertrigo: -Continue nystatin powder   COPD:  -Continue duonebs PRN  FEN: LR 100cc/hr, replete lytes prn, carb mod VTE ppx: lovenox Code Status: FULL    Dispo: Anticipated discharge is pending clinical improvement.   Claudean Severance, MD 11/06/2018, 9:06 AM

## 2018-11-07 ENCOUNTER — Inpatient Hospital Stay (HOSPITAL_COMMUNITY): Payer: Medicare (Managed Care)

## 2018-11-07 DIAGNOSIS — Z9889 Other specified postprocedural states: Secondary | ICD-10-CM

## 2018-11-07 LAB — CULTURE, BLOOD (ROUTINE X 2)
Culture: NO GROWTH
Culture: NO GROWTH
SPECIAL REQUESTS: ADEQUATE
Special Requests: ADEQUATE

## 2018-11-07 LAB — COMPREHENSIVE METABOLIC PANEL
ALT: 41 U/L (ref 0–44)
AST: 44 U/L — AB (ref 15–41)
Albumin: 2.1 g/dL — ABNORMAL LOW (ref 3.5–5.0)
Alkaline Phosphatase: 40 U/L (ref 38–126)
Anion gap: 9 (ref 5–15)
BUN: 9 mg/dL (ref 8–23)
CO2: 28 mmol/L (ref 22–32)
Calcium: 8.4 mg/dL — ABNORMAL LOW (ref 8.9–10.3)
Chloride: 100 mmol/L (ref 98–111)
Creatinine, Ser: 0.87 mg/dL (ref 0.44–1.00)
GFR calc Af Amer: >60 mL/min (ref >60)
Glucose, Bld: 164 mg/dL — ABNORMAL HIGH (ref 70–99)
Potassium: 3.9 mmol/L (ref 3.5–5.1)
Sodium: 137 mmol/L (ref 135–145)
Total Bilirubin: 0.4 mg/dL (ref 0.3–1.2)
Total Protein: 5 g/dL — ABNORMAL LOW (ref 6.5–8.1)

## 2018-11-07 LAB — CBC
HCT: 31.3 % — ABNORMAL LOW (ref 36.0–46.0)
Hemoglobin: 10.2 g/dL — ABNORMAL LOW (ref 12.0–15.0)
MCH: 29.9 pg (ref 26.0–34.0)
MCHC: 32.6 g/dL (ref 30.0–36.0)
MCV: 91.8 fL (ref 80.0–100.0)
Platelets: 157 10*3/uL (ref 150–400)
RBC: 3.41 MIL/uL — ABNORMAL LOW (ref 3.87–5.11)
RDW: 12.5 % (ref 11.5–15.5)
WBC: 9.4 10*3/uL (ref 4.0–10.5)
nRBC: 0 % (ref 0.0–0.2)

## 2018-11-07 LAB — GLUCOSE, CAPILLARY
GLUCOSE-CAPILLARY: 125 mg/dL — AB (ref 70–99)
Glucose-Capillary: 133 mg/dL — ABNORMAL HIGH (ref 70–99)
Glucose-Capillary: 192 mg/dL — ABNORMAL HIGH (ref 70–99)

## 2018-11-07 LAB — HEPARIN LEVEL (UNFRACTIONATED)
Heparin Unfractionated: 0.11 IU/mL — ABNORMAL LOW (ref 0.30–0.70)
Heparin Unfractionated: 0.61 IU/mL (ref 0.30–0.70)

## 2018-11-07 MED ORDER — WARFARIN SODIUM 5 MG PO TABS
5.0000 mg | ORAL_TABLET | Freq: Once | ORAL | Status: AC
  Administered 2018-11-07: 5 mg via ORAL
  Filled 2018-11-07: qty 1

## 2018-11-07 MED ORDER — HEPARIN BOLUS VIA INFUSION
2000.0000 [IU] | Freq: Once | INTRAVENOUS | Status: AC
  Administered 2018-11-07: 2000 [IU] via INTRAVENOUS
  Filled 2018-11-07: qty 2000

## 2018-11-07 MED ORDER — PATIENT'S GUIDE TO USING COUMADIN BOOK
Freq: Once | Status: AC
  Administered 2018-11-08: 07:00:00
  Filled 2018-11-07: qty 1

## 2018-11-07 MED ORDER — WARFARIN - PHARMACIST DOSING INPATIENT
Freq: Every day | Status: DC
  Administered 2018-11-07 – 2018-11-08 (×2)

## 2018-11-07 NOTE — Progress Notes (Signed)
Internal Medicine Attending:   I saw and examined the patient. I reviewed the resident's note and I agree with the resident's findings and plan as documented in the resident's note.  Patient feels well today and denies any acute complaints.  Patient was initially admitted to the hospital with abdominal pain likely secondary to gastroenteritis but her hospital course was complicated by left lower extremity arterial emboli in her popliteal and tibial arteries.  She is now status post left popliteal tibial embolectomy and 4 compartment fasciotomy on February 16.  Vascular surgery follow-up and recommendations appreciated.  Continue with heparin drip for now.  Will start patient on oral anticoagulation (Coumadin) today per vascular surgery recommendations.  No plans to return to the OR currently.  Continue with pain control for now.  Source of aortic emboli remain uncertain at this time as work-up thus far has been negative.  We will follow-up CTA chest to complete her work-up.  Patient may also need a TEE to look for possible cardiac thrombus.

## 2018-11-07 NOTE — Progress Notes (Signed)
Physical Therapy Treatment Patient Details Name: Gabriella [name] MRN: [id number] DOB: [birth year] Today's Date: 11/07/2018    History of Present Illness Gabriella [name] is a 78 year old woman with pMH of HTN, DM2, COPD, arthritis who presented with a diarrheal illness, guaiac + stool and abdominal pain.  She was dizzy and weak at home, she was noted to have enteritis on CT scan.  She also reported severe left leg pain. She met sepsis criteria. [facility]. Pt s/p Left popliteal and tibial embolectomy, 4 compartment fasciotomy, vein patch angioplasty left popliteal artery 11/05/2018.    PT Comments    Patient seen for further assessment s/p embolectomy with fasciotomy from 2/16. At this time, patient motivated but only able to tolerate minimal activity transfer OOB to [facility] and attempt limited mobility with RW. Unable to ambulate today due to increased pain. At this time, continue to feel SNF remains appropriate. Will continue to see and progress as tolerated.   Follow Up Recommendations  SNF     Equipment Recommendations  None recommended by PT    Recommendations for Other Services       Precautions / Restrictions Precautions Precautions: Fall Precaution Comments: LLE sx 11/05/2018 with fasciotomy x4    Mobility  Bed Mobility Overal bed mobility: Needs Assistance Bed Mobility: Supine to Sit;Sit to Supine     Supine to sit: Min assist Sit to supine: Min assist   General bed mobility comments: Min assist for management of LLE, increased time and effort to perform. Assist to elevate and reposition LLE upon return to bed  Transfers Overall transfer level: Needs assistance Equipment used: Rolling walker (2 wheeled) Transfers: Sit to/from [facility] Sit to Stand: Min assist Stand pivot transfers: Mod assist       General transfer comment: Min assist to power up to standing with heavy reliance on RW for UE support  Ambulation/Gait Ambulation/Gait  assistance: Mod assist Gait Distance (Feet): 2 Feet Assistive device: Rolling walker (2 wheeled)   Gait velocity: decreased Gait velocity interpretation: <1.31 ft/sec, indicative of household ambulator General Gait Details: attempted to take steps with RW, limited ability to maintain weight on LLE due to increased pain   Stairs             Wheelchair Mobility    Modified Rankin (Stroke Patients Only)       Balance Overall balance assessment: Needs assistance;History of Falls Sitting-balance support: Feet supported;No upper extremity supported Sitting balance-Leahy Scale: Good     Standing balance support: Bilateral upper extremity supported;During functional activity Standing balance-Leahy Scale: Poor Standing balance comment: dependent on BUE                            Cognition Arousal/Alertness: Awake/alert;Lethargic Behavior During Therapy: WFL for tasks assessed/performed Overall Cognitive Status: No family/caregiver present to determine baseline cognitive functioning                                 General Comments: some intermittent lethargy during session      Exercises      General Comments        Pertinent Vitals/Pain Pain Assessment: 0-10 Faces Pain Scale: Hurts whole lot Pain Location: LLE Pain Descriptors / Indicators: Aching;Sore;Throbbing Pain Intervention(s): Monitored during session;Repositioned    Home Living  Prior Function            PT Goals (current goals can now be found in the care plan section) Acute Rehab PT Goals Patient Stated Goal: get back home to go to [facility] PT Goal Formulation: With patient Time For Goal Achievement: 11/18/18 Potential to Achieve Goals: Fair Progress towards PT goals: Progressing toward goals    Frequency    Min 3X/week      PT Plan Current plan remains appropriate    Co-evaluation              AM-PAC PT "6 Clicks" Mobility    Outcome Measure  Help needed turning from your back to your side while in a flat bed without using bedrails?: None Help needed moving from lying on your back to sitting on the side of a flat bed without using bedrails?: A Little Help needed moving to and from a bed to a chair (including a wheelchair)?: A Lot Help needed standing up from a chair using your arms (e.g., wheelchair or bedside chair)?: A Lot Help needed to walk in hospital room?: A Lot Help needed climbing 3-5 steps with a railing? : Total 6 Click Score: 14    End of Session Equipment Utilized During Treatment: Gait belt Activity Tolerance: Patient tolerated treatment well Patient left: in bed;with call bell/phone within reach;with bed alarm set Nurse Communication: Mobility status PT Visit Diagnosis: Unsteadiness on feet (R26.81);Muscle weakness (generalized) (M62.81);Difficulty in walking, not elsewhere classified (R26.2);Pain Pain - Right/Left: Left Pain - part of body: Leg     Time: 6728-9791 PT Time Calculation (min) (ACUTE ONLY): 18 min  Charges:  $Therapeutic Activity: 8-22 mins                     Alben Deeds, PT DPT  Board Certified Neurologic Specialist [place] Pager [phone] Office [place] 11/07/2018, 4:06 PM

## 2018-11-07 NOTE — Progress Notes (Signed)
Pt daughter, Rosey Bath, at bedside and is encouraging patient to go to SNF for rehab vs. Home at D/C.  Pt has been hesitant, but is now reconsidering.  Rosey Bath said that she will be in tomorrow in order to further discuss with social work.

## 2018-11-07 NOTE — Progress Notes (Signed)
CSW spoke with the patient and she wants to go home but states she will talk with her daughter and they will make a decision regarding SNF placement. . Her daughter, Rosey Bath states she will be here today to talk with the patient. Patient's daughter expressed she does not want to go against her mother's wishes but wants to explore the options.   Spoke with [facility] SW Marylu Lund, and they are concerned about patient going home because of multiple falls in the home- however, they will discuss options with family and update CSW.    Antony Blackbird, [facility] Clinical Social Worker [phone]

## 2018-11-07 NOTE — Progress Notes (Signed)
ABI's have been completed. Preliminary results can be found in CV Proc through chart review.   11/07/18 11:29 AM Olen Cordial RVT

## 2018-11-07 NOTE — Progress Notes (Signed)
Subjective: Gabriella [name] is doing good. She mentions that her stomach sore. We discussed the echo result with her and the plan to do more chest imaging. She asks when she can go home and how her activity can be, we discussed that it will depend on when she needs to return to surgery and figuring out her disposition. All of her questions and answered addressed.   Objective:  Vital signs in last 24 hours: Vitals:   11/06/18 1500 11/06/18 2028 11/06/18 2339 11/07/18 0457  BP: (!) 141/76 (!) 155/84 (!) 146/68 133/62  Pulse: 80  79 86  Resp: 15  (!) 24 17  Temp: 98.1 F (36.7 C) 100.1 F (37.8 C) 97.9 F (36.6 C) (!) 97.5 F (36.4 C)  TempSrc: Oral Oral Oral Oral  SpO2:   91% 90%  Weight:      Height:        General: Frail appearing female, NAD, resting comfortably Cardiac: RRR, systolic murmur Pulmonary: CTABL, no wheezing or rhonchi Extremity: Left leg in bandage, 2+ pulses bilaterally and warm extremities  Assessment/Plan:  Principal Problem:   Gastroenteritis Active Problems:   AKI (acute kidney injury) (HCC)   Diarrhea   Hyperglycemia   Peripheral vascular disease (HCC)  This is a 78 year old female with a history of DM, COPD, HTN, and arthritis who presented with worsening diarrhea, abdominal pain, and generalized weakness. Found to be tachycardic, tachypneic, with soft blood pressures. Labs are significant for metabolic acidosis, creatinine 2.2, baseline 0.8, elevated lactic acid of 5.2, and leukocytosis of 26. Found to have intermittent leg pains, decreased pulses and cool extremities. LE u/s showed no DVT but artery occlusions. CTA showed embolus to LLE and she was taken for emergent embolectomy and fasciotomy.   Abdominal pain 2/2 gastroenteritis: CT scan showed small bowel enteritis. GI panel was negative Leukocytosis has improved and she remains afebrile. CTA abdomen showed improvement in her enteritis, no evidence of mesenteric ischemia, minimal atherosclerosis of  aorta. It appears that this is likely 2/2 gastroenteritis.  -Continue zofran and fentanyl PRN for pain control -Carb modified diet  -Continue maintenance fluids  Left LE emboli in popliteal and tibial artery  S/p left popliteal and tibial embolectomy, 4 compartment fasciotomy, and vein patch angioplasty of the left popliteal artery on 2/16. She is doing well today, pulses intact and extremities are warm. She reports that she is still having some leg pain but the pain medications help. She required 5 doses of fentanyl yesterday and 3 doses of percocet.  -Echocardiogram showed EF 60-65%, LVH, G1DD, normal biatrial size, normal IVC, no wall motion abnormalities, no clots seen.  -Continue percocet PRN -Continue fentanyl PRN -Continue heparin drip, switch to coumadin -Vascular surgery following, appreciate recommendations, plan fasciotomy closures based on muscle appearance, will change dressing today > No plans to return to OR today -PT/OT > recommends SNF, spoke with [facility] physician who reports that they can provide PT/OT services but they may not be able to provide the wound care depending on what she requires -CTA chest today  Uncontrolled Diabetes: She is on insulin and metformin at home. She reports that she does not always take her medications. A1c here was 11.5. CBGs have been in a good range.  -Continue lantus 28 units daily -Continue novolog 6 units TID WC -SSI  AKI: Resolved with IV fluids.  -continue maintenance fluids since possibly CT angio today -Repeat BMP in AM  Candidal intertrigo: -Continue nystatin powder   COPD:  -Continue duonebs  PRN  FEN: LR 100cc/hr, replete lytes prn, carb mod VTE ppx: lovenox Code Status: FULL   Dispo: Anticipated discharge in approximately 1-2 day(s).   Claudean Severance, MD 11/07/2018, 6:20 AM Pager: [phone]

## 2018-11-07 NOTE — Progress Notes (Addendum)
ANTICOAGULATION CONSULT NOTE   Pharmacy Consult for heparin + warfarin Indication: DVT  Allergies  Allergen Reactions  . Other Diarrhea, Nausea And Vomiting, Anxiety and Other (See Comments)    Unknown pain medication: caused sweating, nervousness, and "Some new pain med given at [facility], but I don't know what it's called." Patient states "it knocked me out and I got sweaty and they gave me benadryl."     Patient Measurements: Height: 5\' 3"  (160 cm) Weight: 202 lb 9.6 oz (91.9 kg) IBW/kg (Calculated) : 52.4 Heparin Dosing Weight: 73kg  Vital Signs: Temp: 97.5 F (36.4 C) (02/18 0853) Temp Source: Oral (02/18 0853) BP: 131/68 (02/18 0853) Pulse Rate: 80 (02/18 0853)  Labs: Recent Labs    11/05/18 0520 11/06/18 0406 11/06/18 0950 11/06/18 1810 11/07/18 0846 11/07/18 0849  HGB 11.0* 11.1*  --   --   --  10.2*  HCT 33.7* 33.6*  --   --   --  31.3*  PLT 162 158  --   --   --  157  HEPARINUNFRC  --   --  0.39 0.16* 0.11*  --   CREATININE 0.87 0.84  --   --   --   --     Estimated Creatinine Clearance: 62.3 mL/min (by C-G formula based on SCr of 0.84 mg/dL).   Medical History: Past Medical History:  Diagnosis Date  . Arthritis   . COPD (chronic obstructive pulmonary disease) (HCC)   . Diabetes mellitus without complication (HCC)   . Hypertension     Medications:  Medications Prior to Admission  Medication Sig Dispense Refill Last Dose  . acetaminophen (TYLENOL) 650 MG CR tablet Take 650 mg by mouth See admin instructions. Take 650 mg by mouth two times a day and an additional 650 mg at midday as needed for breakthrough pain   11/01/2018 at pm  . albuterol (PROVENTIL HFA) 108 (90 Base) MCG/ACT inhaler Inhale 2 puffs into the lungs every 6 (six) hours as needed for wheezing or shortness of breath.    unk at [employer]  . amLODipine (NORVASC) 10 MG tablet Take 10 mg by mouth daily.   11/01/2018 at am  . Artificial Saliva (BIOTENE DRY MOUTH MOISTURIZING) SOLN 1  spray by Mouth Rinse route See admin instructions. Spray in the mouth 6 times a day   unk at unk  . chlorhexidine (PERIDEX) 0.12 % solution 5 mLs See admin instructions. Brush 1 teaspoonful of solution onto teeth and gums after PM mouth care/spit out excess and do not rinse   11/01/2018 at Unknown time  . Cholecalciferol (VITAMIN D3) 1.25 MG (50000 UT) CAPS Take 50,000 Units by mouth every Sunday.   10/22/2018 at Unknown time  . Dextran 70-Hypromellose (ARTIFICIAL TEARS PF OP) Place 1 drop into both eyes 2 (two) times daily.   unk at Unknown time  . diphenhydramine-acetaminophen (TYLENOL PM) 25-500 MG TABS tablet Take 2 tablets by mouth at bedtime.    11/01/2018 at pm  . DULoxetine (CYMBALTA) 60 MG capsule Take 60 mg by mouth daily.   11/01/2018 at am  . Fluticasone-Salmeterol (ADVAIR DISKUS) 250-50 MCG/DOSE AEPB Inhale 1 puff into the lungs 2 (two) times daily.   unk at unk  . gabapentin (NEURONTIN) 600 MG tablet Take 600 mg by mouth 2 (two) times daily.    11/01/2018 at pm  . [EXPIRED] insulin aspart (NOVOLOG FLEXPEN) 100 UNIT/ML FlexPen Inject 10 Units into the skin once as needed (for a BGL of 504).  unk at [employer]  . Insulin Glargine (BASAGLAR KWIKPEN) 100 UNIT/ML SOPN Inject 50 Units into the skin at bedtime.    11/01/2018 at pm  . ipratropium-albuterol (DUONEB) 0.5-2.5 (3) MG/3ML SOLN Take 3 mLs by nebulization every 4 (four) hours as needed. (Patient taking differently: Take 3 mLs by nebulization every 4 (four) hours as needed (for wheezing or shortness of breath). ) 3 mL 0 unk at unk  . lisinopril (PRINIVIL,ZESTRIL) 30 MG tablet Take 30 mg by mouth daily.   11/01/2018 at Unknown time  . Melatonin 10 MG CAPS Take 10 mg by mouth at bedtime.   11/01/2018 at pm  . Menthol, Topical Analgesic, (BIOFREEZE EX) Apply 1 application topically See admin instructions. Apply to affected areas 2 times a day for pain   unk at unk  . metFORMIN (GLUCOPHAGE) 1000 MG tablet Take 1,000 mg by mouth 2 (two) times daily with a  meal.   11/01/2018 at Unknown time  . miconazole (MICRO GUARD) 2 % powder Apply 1 application topically See admin instructions. Apply to skin folds and perineum 2 times a day   unk at unk  . mineral oil-hydrophilic petrolatum (AQUAPHOR) ointment Apply 1 application topically See admin instructions. Apply to dry skin daily and after bathing   unk at unk  . rOPINIRole (REQUIP) 2 MG tablet Take 2 mg by mouth at bedtime.   10/31/2018 at pm  . rosuvastatin (CRESTOR) 20 MG tablet Take 20 mg by mouth at bedtime.   11/01/2018 at pm  . sennosides-docusate sodium (SENOKOT-S) 8.6-50 MG tablet Take 2 tablets by mouth at bedtime.   unk at [employer]  . traZODone (DESYREL) 50 MG tablet Take 50 mg by mouth at bedtime.   11/01/2018 at pm  . Turmeric 500 MG CAPS Take 500 mg by mouth 2 (two) times daily.   11/01/2018 at Unknown time   Scheduled:  . acetaminophen  650 mg Oral Q6H   Or  . acetaminophen  650 mg Rectal Q6H  . docusate sodium  100 mg Oral Daily  . DULoxetine  60 mg Oral Daily  . feeding supplement (GLUCERNA SHAKE)  237 mL Oral BID BM  . gabapentin  600 mg Oral BID  . insulin aspart  0-15 Units Subcutaneous TID WC  . insulin aspart  0-5 Units Subcutaneous QHS  . insulin aspart  6 Units Subcutaneous TID WC  . insulin glargine  28 Units Subcutaneous QHS  . nystatin   Topical BID  . pantoprazole  40 mg Oral Daily  . ramelteon  8 mg Oral QHS  . rosuvastatin  20 mg Oral QHS   Infusions:  . sodium chloride    . heparin Stopped (11/06/18 2338)  . lactated ringers 100 mL/hr at 11/07/18 0501    Assessment: 77 yoF admitted with abd pain and found to have embolism on CT. Pharmacy consulted for anticoagulation, pt now s/p urgent embolectomy and fasciotomy on 2/16. Pt remains on heparin, eventually to start PO anticoagulation. Heparin level subtherapeutic this morning, infusion has been running per RN. CBC stable.   Goal of Therapy:  Heparin level 0.3-0.7 units/ml Monitor platelets by anticoagulation protocol:  Yes   Plan:  -Heparin 2000 unit bolus then increase to 1450 units/h -Recheck 8hr heparin level   ADDENDUM: Pharmacy to start warfarin load. Heparin plan as above, give warfarin 5mg  PO x1 tonight.   Fredonia Highland, PharmD, BCPS Clinical Pharmacist [phone] Please check AMION for all [facility] numbers 11/07/2018

## 2018-11-07 NOTE — Progress Notes (Signed)
ANTICOAGULATION CONSULT NOTE   Pharmacy Consult for heparin + warfarin Indication: DVT  Allergies  Allergen Reactions  . Other Diarrhea, Nausea And Vomiting, Anxiety and Other (See Comments)    Unknown pain medication: caused sweating, nervousness, and "Some new pain med given at [facility], but I don't know what it's called." Patient states "it knocked me out and I got sweaty and they gave me benadryl."     Patient Measurements: Height: 5\' 3"  (160 cm) Weight: 202 lb 9.6 oz (91.9 kg) IBW/kg (Calculated) : 52.4 Heparin Dosing Weight: 73kg  Vital Signs: Temp: 97.5 F (36.4 C) (02/18 0853) Temp Source: Oral (02/18 0853) BP: 131/68 (02/18 0853) Pulse Rate: 80 (02/18 0853)  Labs: Recent Labs    11/05/18 0520 11/06/18 0406  11/06/18 1810 11/07/18 0842 11/07/18 0846 11/07/18 0849 11/07/18 1814  HGB 11.0* 11.1*  --   --   --   --  10.2*  --   HCT 33.7* 33.6*  --   --   --   --  31.3*  --   PLT 162 158  --   --   --   --  157  --   HEPARINUNFRC  --   --    < > 0.16*  --  0.11*  --  0.61  CREATININE 0.87 0.84  --   --  0.87  --   --   --    < > = values in this interval not displayed.    Estimated Creatinine Clearance: 60.2 mL/min (by C-G formula based on SCr of 0.87 mg/dL).   Medical History: Past Medical History:  Diagnosis Date  . Arthritis   . COPD (chronic obstructive pulmonary disease) (HCC)   . Diabetes mellitus without complication (HCC)   . Hypertension     Medications:  Medications Prior to Admission  Medication Sig Dispense Refill Last Dose  . acetaminophen (TYLENOL) 650 MG CR tablet Take 650 mg by mouth See admin instructions. Take 650 mg by mouth two times a day and an additional 650 mg at midday as needed for breakthrough pain   11/01/2018 at pm  . albuterol (PROVENTIL HFA) 108 (90 Base) MCG/ACT inhaler Inhale 2 puffs into the lungs every 6 (six) hours as needed for wheezing or shortness of breath.    [facility] at [employer]  . amLODipine (NORVASC) 10 MG  tablet Take 10 mg by mouth daily.   11/01/2018 at am  . Artificial Saliva (BIOTENE DRY MOUTH MOISTURIZING) SOLN 1 spray by Mouth Rinse route See admin instructions. Spray in the mouth 6 times a day   unk at unk  . chlorhexidine (PERIDEX) 0.12 % solution 5 mLs See admin instructions. Brush 1 teaspoonful of solution onto teeth and gums after PM mouth care/spit out excess and do not rinse   11/01/2018 at Unknown time  . Cholecalciferol (VITAMIN D3) 1.25 MG (50000 UT) CAPS Take 50,000 Units by mouth every Sunday.   10/22/2018 at Unknown time  . Dextran 70-Hypromellose (ARTIFICIAL TEARS PF OP) Place 1 drop into both eyes 2 (two) times daily.   unk at Unknown time  . diphenhydramine-acetaminophen (TYLENOL PM) 25-500 MG TABS tablet Take 2 tablets by mouth at bedtime.    11/01/2018 at pm  . DULoxetine (CYMBALTA) 60 MG capsule Take 60 mg by mouth daily.   11/01/2018 at am  . Fluticasone-Salmeterol (ADVAIR DISKUS) 250-50 MCG/DOSE AEPB Inhale 1 puff into the lungs 2 (two) times daily.   unk at unk  . gabapentin (NEURONTIN)  600 MG tablet Take 600 mg by mouth 2 (two) times daily.    11/01/2018 at pm  . [EXPIRED] insulin aspart (NOVOLOG FLEXPEN) 100 UNIT/ML FlexPen Inject 10 Units into the skin once as needed (for a BGL of 504).    unk at [employer]  . Insulin Glargine (BASAGLAR KWIKPEN) 100 UNIT/ML SOPN Inject 50 Units into the skin at bedtime.    11/01/2018 at pm  . ipratropium-albuterol (DUONEB) 0.5-2.5 (3) MG/3ML SOLN Take 3 mLs by nebulization every 4 (four) hours as needed. (Patient taking differently: Take 3 mLs by nebulization every 4 (four) hours as needed (for wheezing or shortness of breath). ) 3 mL 0 unk at unk  . lisinopril (PRINIVIL,ZESTRIL) 30 MG tablet Take 30 mg by mouth daily.   11/01/2018 at Unknown time  . Melatonin 10 MG CAPS Take 10 mg by mouth at bedtime.   11/01/2018 at pm  . Menthol, Topical Analgesic, (BIOFREEZE EX) Apply 1 application topically See admin instructions. Apply to affected areas 2 times a day  for pain   unk at unk  . metFORMIN (GLUCOPHAGE) 1000 MG tablet Take 1,000 mg by mouth 2 (two) times daily with a meal.   11/01/2018 at Unknown time  . miconazole (MICRO GUARD) 2 % powder Apply 1 application topically See admin instructions. Apply to skin folds and perineum 2 times a day   unk at unk  . mineral oil-hydrophilic petrolatum (AQUAPHOR) ointment Apply 1 application topically See admin instructions. Apply to dry skin daily and after bathing   unk at unk  . rOPINIRole (REQUIP) 2 MG tablet Take 2 mg by mouth at bedtime.   10/31/2018 at pm  . rosuvastatin (CRESTOR) 20 MG tablet Take 20 mg by mouth at bedtime.   11/01/2018 at pm  . sennosides-docusate sodium (SENOKOT-S) 8.6-50 MG tablet Take 2 tablets by mouth at bedtime.   unk at [employer]  . traZODone (DESYREL) 50 MG tablet Take 50 mg by mouth at bedtime.   11/01/2018 at pm  . Turmeric 500 MG CAPS Take 500 mg by mouth 2 (two) times daily.   11/01/2018 at Unknown time   Scheduled:  . acetaminophen  650 mg Oral Q6H   Or  . acetaminophen  650 mg Rectal Q6H  . docusate sodium  100 mg Oral Daily  . DULoxetine  60 mg Oral Daily  . feeding supplement (GLUCERNA SHAKE)  237 mL Oral BID BM  . gabapentin  600 mg Oral BID  . insulin aspart  0-15 Units Subcutaneous TID WC  . insulin aspart  0-5 Units Subcutaneous QHS  . insulin aspart  6 Units Subcutaneous TID WC  . insulin glargine  28 Units Subcutaneous QHS  . nystatin   Topical BID  . pantoprazole  40 mg Oral Daily  . ramelteon  8 mg Oral QHS  . rosuvastatin  20 mg Oral QHS  . Warfarin - Pharmacist Dosing Inpatient   Does not apply q1800   Infusions:  . sodium chloride    . heparin 1,450 Units/hr (11/07/18 1727)  . lactated ringers 100 mL/hr at 11/07/18 1728    Assessment: 77 yoF admitted with abd pain and found to have embolism on CT. Pharmacy consulted for anticoagulation, pt now s/p urgent embolectomy and fasciotomy on 2/16. Pt remains on heparin, eventually to start PO anticoagulation.    Heparin level this evening is within goal range after rate adjustment this morning. Level trended up significantly, will make small rate change to keep within goal. Also noted  patient started on warfarin tonight.    Goal of Therapy:  Heparin level 0.3-0.7 units/ml Monitor platelets by anticoagulation protocol: Yes   Plan:  Reduce heparin to 1400 units/hr Warfarin already addressed Heparin level and INR in am  Sheppard Coil PharmD., BCPS Clinical Pharmacist 11/07/2018 7:58 PM

## 2018-11-07 NOTE — Progress Notes (Signed)
[facility]  Subjective  - left leg hurts  Objective 131/68 80 (!) 97.5 F (36.4 C) (Oral) 19 93%  Intake/Output Summary (Last 24 hours) at 11/07/2018 1233 Last data filed at 11/07/2018 1227 Gross per 24 hour  Intake 2533 ml  Output 1400 ml  Net 1133 ml   Left leg 2+ DP pulse left lateral incision some swelling may have small hematoma will observe for now.  Low threshold to open incision tomorrow if pain persists she had good range of motion in the ankle  Medial incision healing well  Assessment/Planning: No plans to return to OR currently  Ok to eat from my standpoint  Embolus: ECHO normal ? Paroxysmal Afib, CTA chest still pending  Continue heparin.  Needs oral anticoagulation ok to start anytime from my standpoint  Fabienne Bruns 11/07/2018 12:33 PM --  Laboratory Lab Results: Recent Labs    11/06/18 0406 11/07/18 0849  WBC 12.2* 9.4  HGB 11.1* 10.2*  HCT 33.6* 31.3*  PLT 158 157   BMET Recent Labs    11/06/18 0406 11/07/18 0842  NA 139 137  K 3.9 3.9  CL 103 100  CO2 27 28  GLUCOSE 92 164*  BUN 10 9  CREATININE 0.84 0.87  CALCIUM 8.5* 8.4*    COAG Lab Results  Component Value Date   INR 1.15 11/02/2018   INR >10.00 (HH) 11/02/2018   INR 1.22 11/02/2018   No results found for: PTT

## 2018-11-08 ENCOUNTER — Inpatient Hospital Stay (HOSPITAL_COMMUNITY): Payer: Medicare (Managed Care)

## 2018-11-08 DIAGNOSIS — Z9862 Peripheral vascular angioplasty status: Secondary | ICD-10-CM

## 2018-11-08 LAB — PROTIME-INR
INR: 1.06
Prothrombin Time: 13.7 seconds (ref 11.4–15.2)

## 2018-11-08 LAB — GLUCOSE, CAPILLARY
Glucose-Capillary: 138 mg/dL — ABNORMAL HIGH (ref 70–99)
Glucose-Capillary: 56 mg/dL — ABNORMAL LOW (ref 70–99)
Glucose-Capillary: 99 mg/dL (ref 70–99)
Glucose-Capillary: 146 mg/dL — ABNORMAL HIGH (ref 70–99)
Glucose-Capillary: 106 mg/dL — ABNORMAL HIGH (ref 70–99)
Glucose-Capillary: 62 mg/dL — ABNORMAL LOW (ref 70–99)
Glucose-Capillary: 142 mg/dL — ABNORMAL HIGH (ref 70–99)
Glucose-Capillary: 286 mg/dL — ABNORMAL HIGH (ref 70–99)

## 2018-11-08 LAB — CBC
HCT: 28.9 % — ABNORMAL LOW (ref 36.0–46.0)
Hemoglobin: 9.2 g/dL — ABNORMAL LOW (ref 12.0–15.0)
MCH: 29.7 pg (ref 26.0–34.0)
MCHC: 31.8 g/dL (ref 30.0–36.0)
MCV: 93.2 fL (ref 80.0–100.0)
Platelets: 136 10*3/uL — ABNORMAL LOW (ref 150–400)
RBC: 3.1 MIL/uL — ABNORMAL LOW (ref 3.87–5.11)
RDW: 12.5 % (ref 11.5–15.5)
WBC: 10 10*3/uL (ref 4.0–10.5)
nRBC: 0 % (ref 0.0–0.2)

## 2018-11-08 LAB — HEPARIN LEVEL (UNFRACTIONATED): Heparin Unfractionated: 0.38 IU/mL (ref 0.30–0.70)

## 2018-11-08 MED ORDER — IOPAMIDOL (ISOVUE-370) INJECTION 76%
100.0000 mL | Freq: Once | INTRAVENOUS | Status: AC | PRN
  Administered 2018-11-08: 100 mL via INTRAVENOUS

## 2018-11-08 MED ORDER — WARFARIN VIDEO: Freq: Once | Status: DC

## 2018-11-08 MED ORDER — WARFARIN SODIUM 5 MG PO TABS
5.0000 mg | ORAL_TABLET | Freq: Once | ORAL | Status: AC
  Administered 2018-11-08: 5 mg via ORAL
  Filled 2018-11-08: qty 1

## 2018-11-08 MED ORDER — DEXTROSE 50 % IV SOLN
INTRAVENOUS | Status: AC
  Administered 2018-11-08: 25 mL
  Filled 2018-11-08: qty 50

## 2018-11-08 NOTE — Discharge Instructions (Addendum)
Gabriella [name],   It has been a pleasure working with you and we are glad you're feeling better. You were hospitalized for a stomach virus and you were found to have a clot in the arteries of your leg.   For your blood clots,  START taking Coumadin daily Start taking OxyIR 10 mg every 4 hours as needed for pain control Follow up with vascular surgery, they will contact you to set up an appointment   Your blood pressures were a little low so we held your amlodipine and lisinopril. Please continue to hold these for now. Your PCP can decide if you need to restart this.   Follow up with your primary care provider in 1-2 weeks  If your symptoms worsen or you develop new symptoms, please seek medical help whether it is your primary care provider or emergency department.  If you have any questions about this hospitalization please call [phone].   Information on my medicine - Coumadin   (Warfarin)  This medication education was reviewed with me or my healthcare representative as part of my discharge preparation.  The pharmacist that spoke with me during my hospital stay was:  Mosetta Anis, [facility]  Why was Coumadin prescribed for you? Coumadin was prescribed for you because you have a blood clot or a medical condition that can cause an increased risk of forming blood clots. Blood clots can cause serious health problems by blocking the flow of blood to the heart, lung, or brain. Coumadin can prevent harmful blood clots from forming. As a reminder your indication for Coumadin is:   Blood Clotting Disorder  What test will check on my response to Coumadin? While on Coumadin (warfarin) you will need to have an INR test regularly to ensure that your dose is keeping you in the desired range. The INR (international normalized ratio) number is calculated from the result of the laboratory test called prothrombin time (PT).  If an INR APPOINTMENT HAS NOT ALREADY BEEN MADE FOR YOU please schedule an  appointment to have this lab work done by your health care provider within 7 days. Your INR goal is usually a number between:  2 to 3 or your provider may give you a more narrow range like 2-2.5.  Ask your health care provider during an office visit what your goal INR is.  What  do you need to  know  About  COUMADIN? Take Coumadin (warfarin) exactly as prescribed by your healthcare provider about the same time each day.  DO NOT stop taking without talking to the doctor who prescribed the medication.  Stopping without other blood clot prevention medication to take the place of Coumadin may increase your risk of developing a new clot or stroke.  Get refills before you run out.  What do you do if you miss a dose? If you miss a dose, take it as soon as you remember on the same day then continue your regularly scheduled regimen the next day.  Do not take two doses of Coumadin at the same time.  Important Safety Information A possible side effect of Coumadin (Warfarin) is an increased risk of bleeding. You should call your healthcare provider right away if you experience any of the following: ? Bleeding from an injury or your nose that does not stop. ? Unusual colored urine (red or dark brown) or unusual colored stools (red or black). ? Unusual bruising for unknown reasons. ? A serious fall or if you hit your head (even if  there is no bleeding).  Some foods or medicines interact with Coumadin (warfarin) and might alter your response to warfarin. To help avoid this: ? Eat a balanced diet, maintaining a consistent amount of Vitamin K. ? Notify your provider about major diet changes you plan to make. ? Avoid alcohol or limit your intake to 1 drink for women and 2 drinks for men per day. (1 drink is 5 oz. wine, 12 oz. beer, or 1.5 oz. liquor.)  Make sure that ANY health care provider who prescribes medication for you knows that you are taking Coumadin (warfarin).  Also make sure the healthcare provider  who is monitoring your Coumadin knows when you have started a new medication including herbals and non-prescription products.  Coumadin (Warfarin)  Major Drug Interactions  Increased Warfarin Effect Decreased Warfarin Effect  Alcohol (large quantities) Antibiotics (esp. Septra/Bactrim, Flagyl, Cipro) Amiodarone (Cordarone) Aspirin (ASA) Cimetidine (Tagamet) Megestrol (Megace) NSAIDs (ibuprofen, naproxen, etc.) Piroxicam (Feldene) Propafenone (Rythmol SR) Propranolol (Inderal) Isoniazid (INH) Posaconazole (Noxafil) Barbiturates (Phenobarbital) Carbamazepine (Tegretol) Chlordiazepoxide (Librium) Cholestyramine (Questran) Griseofulvin Oral Contraceptives Rifampin Sucralfate (Carafate) Vitamin K   Coumadin (Warfarin) Major Herbal Interactions  Increased Warfarin Effect Decreased Warfarin Effect  Garlic Ginseng Ginkgo biloba Coenzyme Q10 Green tea St. Johns wort    Coumadin (Warfarin) FOOD Interactions  Eat a consistent number of servings per week of foods HIGH in Vitamin K (1 serving =  cup)  Collards (cooked, or boiled & drained) Kale (cooked, or boiled & drained) Mustard greens (cooked, or boiled & drained) Parsley *serving size only =  cup Spinach (cooked, or boiled & drained) Swiss chard (cooked, or boiled & drained) Turnip greens (cooked, or boiled & drained)  Eat a consistent number of servings per week of foods MEDIUM-HIGH in Vitamin K (1 serving = 1 cup)  Asparagus (cooked, or boiled & drained) Broccoli (cooked, boiled & drained, or raw & chopped) Brussel sprouts (cooked, or boiled & drained) *serving size only =  cup Lettuce, raw (green leaf, endive, romaine) Spinach, raw Turnip greens, raw & chopped   These websites have more information on Coumadin (warfarin):  http://www.king-russell.com/; https://www.hines.net/;

## 2018-11-08 NOTE — Progress Notes (Signed)
Internal Medicine Attending:   I saw and examined the patient. I reviewed the resident's note and I agree with the resident's findings and plan as documented in the resident's note.  Patient still complains of some pain in her left lower extremity but denies any other acute complaints at this time.  Patient is initially admitted to the hospital with an acute gastroenteritis but her hospital course was complicated by arterial emboli in her left popliteal and tibial arteries.  Patient is now status post embolectomy by vascular surgery with fasciotomy.  Vascular surgery follow-up and recommendations appreciated.  Continue with dressing changes per vascular. Source of emboli remains unclear at this time.  Work-up including 2D echo, CTA chest/abdomen/pelvis have been negative.  We will follow-up antiphospholipid antibody.  Continue pain control for now.  We will continue with heparin drip for anticoagulation until INR is therapeutic.  Continue with Coumadin dosing per pharmacy.  PT/OT recommending SNF placement.  Patient is now agreeable for SNF placement.  No further work-up at this time.

## 2018-11-08 NOTE — Progress Notes (Signed)
[facility]  Subjective  - leg hurts  Objective 123/68 81 98.1 F (36.7 C) (Oral) 14 93%  Intake/Output Summary (Last 24 hours) at 11/08/2018 0843 Last data filed at 11/08/2018 0757 Gross per 24 hour  Intake 2191.16 ml  Output 477 ml  Net 1714.16 ml   Left foot 2+ DP pulse lateral compartment firm.  Incision opened at bedside with evacuation of about 100 cc of hematoma/edema fluid  Assessment/Planning: S/p embolectomy presumed cardiac source CT chest still pending reordered this morning  Will do local wound care wet to dry on fasciotomy possible VAC tomorrow if wound clean and muscle healthy  Continue heparin. Warfarin started per Lyndel Pleasure 11/08/2018 8:43 AM --  Laboratory Lab Results: Recent Labs    11/07/18 0849 11/08/18 0250  WBC 9.4 10.0  HGB 10.2* 9.2*  HCT 31.3* 28.9*  PLT 157 136*   BMET Recent Labs    11/06/18 0406 11/07/18 0842  NA 139 137  K 3.9 3.9  CL 103 100  CO2 27 28  GLUCOSE 92 164*  BUN 10 9  CREATININE 0.84 0.87  CALCIUM 8.5* 8.4*    COAG Lab Results  Component Value Date   INR 1.06 11/08/2018   INR 1.15 11/02/2018   INR >10.00 (HH) 11/02/2018   No results found for: PTT

## 2018-11-08 NOTE — Progress Notes (Signed)
Subjective: Gabriella [name] was doing well today, no acute events overnight. She reports that she is still having left leg pain but that the pain medications help. She denies any nausea, vomiting, or diarrhea. She states that she still has some abdominal pain however this has improved since being here. She was at first undecided about SNF however after discussing with her daughter yesterday and [place] today she has agreed to go to SNF to have rehab. We discussed the plan for further work up for the cause of her emboli and she is in agreement. All questions were answered.   Objective:  Vital signs in last 24 hours: Vitals:   11/07/18 0853 11/07/18 2045 11/07/18 2302 11/08/18 0523  BP: 131/68 134/63 (!) 126/58 114/63  Pulse: 80 82 83 70  Resp: 19 16 14  (!) 9  Temp: (!) 97.5 F (36.4 C) 98.2 F (36.8 C) 98.1 F (36.7 C) 98.1 F (36.7 C)  TempSrc: Oral Oral Oral Oral  SpO2: 93% 94% 90% 99%  Weight:      Height:        General: Frail appearing female, NAD, resting comfortable in bed Cardiac: RRR, systolic murmur Pulmonary: Lungs CTABL, no wheezing or rhonchi Abdomen: Soft, minimal TTP, non-distended, +BS Extremity: Left leg in bandage, warm extremities, 2+ pulses bilateral LE  Assessment/Plan:  Principal Problem:   Gastroenteritis Active Problems:   AKI (acute kidney injury) (HCC)   Diarrhea   Hyperglycemia   Peripheral vascular disease (HCC)   This is a 78 year old female with a history of DM, COPD, HTN, and arthritis who presented with worsening diarrhea, abdominal pain, and generalized weakness. Found to be tachycardic, tachypneic, with soft blood pressures. Labs are significant for metabolic acidosis, creatinine 2.2, baseline 0.8, elevated lactic acid of 5.2, and leukocytosis of 26. Found to have intermittent leg pains, decreased pulses and cool extremities. LE u/s showed no DVT but artery occlusions. CTA showed embolus to LLE and she was taken for emergent embolectomy and fasciotomy.     Abdominal pain 2/2 gastroenteritis: Resolved, she denies any nausea, vomiting or diarrhea. She still has mild abdominal pain however she reports that it has improved since admission. She has been eating and drinking with no issues. Remains afebrile.  -Continue zofran and fentanyl PRN for pain control -Carb modified diet  -Continue maintenance fluids  Left LE emboli in popliteal and tibial artery  S/p left popliteal and tibial embolectomy, 4 compartment fasciotomy, and vein patch angioplasty of the left popliteal artery on 2/16. She continues to do well in regards to this. She is still requiring frequent doses of pain medications, percocet and fentanyl, however this controls her pain. Vascular surgery evaluated and they do not plan to return to surgery at this time, will likely place a wound vac tomorrow.. -Echocardiogram showed EF 60-65%, LVH, G1DD, normal biatrial size, normal IVC, no wall motion abnormalities, no clots seen.  -Continuepercocet PRN -Continue fentanyl PRN -Continue coumadin per pharmacy -Vascular surgery following, appreciate recommendations, plan fasciotomy closures based on muscle appearance, will change dressing today > No plans to return to OR today -PT/OT > recommends SNF, spoke with [facility] physician who reports that they can provide PT/OT services but they may not be able to provide the wound care depending on what she requires -CTA chest today  Uncontrolled Diabetes: She is on insulin and metformin at home. A1c here was 11.5. CBGs have remained in a good range -Continuelantus 28 units daily -Continue novolog 6 units TID WC -SSI  AKI: Resolved with IV fluids.  -continue maintenance fluids since getting CT angio today -Repeat BMP in AM  Candidal intertrigo: -Continue nystatin powder  COPD:  -Continue duonebs PRN  FEN: LR 100cc/hr, replete lytes prn, carb mod VTE ppx: lovenox Code Status: FULL  Dispo: Anticipated discharge in approximately 1-2  day(s).   Claudean Severance, MD 11/08/2018, 6:25 AM Pager: [phone]

## 2018-11-08 NOTE — Progress Notes (Signed)
Dressing change to left lateral lower leg.  Large amount of bloody drainage from the wound.  Packed wound with several 4 x 4 gauze, covered with 3 abd pads and Kerlix.  Wrapped with ace wrap.  Extremity warm to touch with 2+ palpable pulses.  Elevated on pillows.

## 2018-11-08 NOTE — Plan of Care (Signed)
Care plans reviewed and patient is progressing.  

## 2018-11-08 NOTE — Progress Notes (Signed)
ANTICOAGULATION CONSULT NOTE   Pharmacy Consult for heparin + warfarin Indication: DVT  Allergies  Allergen Reactions  . Other Diarrhea, Nausea And Vomiting, Anxiety and Other (See Comments)    Unknown pain medication: caused sweating, nervousness, and "Some new pain med given at [facility], but I don't know what it's called." Patient states "it knocked me out and I got sweaty and they gave me benadryl."     Patient Measurements: Height: 5\' 3"  (160 cm) Weight: 202 lb 9.6 oz (91.9 kg) IBW/kg (Calculated) : 52.4 Heparin Dosing Weight: 73kg  Vital Signs: Temp: 98.1 F (36.7 C) (02/19 0523) Temp Source: Oral (02/19 0523) BP: 114/63 (02/19 0523) Pulse Rate: 70 (02/19 0523)  Labs: Recent Labs    11/06/18 0406  11/07/18 [id number] 11/07/18 0846 11/07/18 0849 11/07/18 1814 11/08/18 0250  HGB 11.1*  --   --   --  10.2*  --  9.2*  HCT 33.6*  --   --   --  31.3*  --  28.9*  PLT 158  --   --   --  157  --  136*  LABPROT  --   --   --   --   --   --  13.7  INR  --   --   --   --   --   --  1.06  HEPARINUNFRC  --    < >  --  0.11*  --  0.61 0.38  CREATININE 0.84  --  0.87  --   --   --   --    < > = values in this interval not displayed.    Estimated Creatinine Clearance: 60.2 mL/min (by C-G formula based on SCr of 0.87 mg/dL).   Medical History: Past Medical History:  Diagnosis Date  . Arthritis   . COPD (chronic obstructive pulmonary disease) (HCC)   . Diabetes mellitus without complication (HCC)   . Hypertension     Medications:  Medications Prior to Admission  Medication Sig Dispense Refill Last Dose  . acetaminophen (TYLENOL) 650 MG CR tablet Take 650 mg by mouth See admin instructions. Take 650 mg by mouth two times a day and an additional 650 mg at midday as needed for breakthrough pain   11/01/2018 at pm  . albuterol (PROVENTIL HFA) 108 (90 Base) MCG/ACT inhaler Inhale 2 puffs into the lungs every 6 (six) hours as needed for wheezing or shortness of breath.     [facility] at [employer]  . amLODipine (NORVASC) 10 MG tablet Take 10 mg by mouth daily.   11/01/2018 at am  . Artificial Saliva (BIOTENE DRY MOUTH MOISTURIZING) SOLN 1 spray by Mouth Rinse route See admin instructions. Spray in the mouth 6 times a day   unk at unk  . chlorhexidine (PERIDEX) 0.12 % solution 5 mLs See admin instructions. Brush 1 teaspoonful of solution onto teeth and gums after PM mouth care/spit out excess and do not rinse   11/01/2018 at Unknown time  . Cholecalciferol (VITAMIN D3) 1.25 MG (50000 UT) CAPS Take 50,000 Units by mouth every Sunday.   10/22/2018 at Unknown time  . Dextran 70-Hypromellose (ARTIFICIAL TEARS PF OP) Place 1 drop into both eyes 2 (two) times daily.   unk at Unknown time  . diphenhydramine-acetaminophen (TYLENOL PM) 25-500 MG TABS tablet Take 2 tablets by mouth at bedtime.    11/01/2018 at pm  . DULoxetine (CYMBALTA) 60 MG capsule Take 60 mg by mouth daily.   11/01/2018 at am  .  Fluticasone-Salmeterol (ADVAIR DISKUS) 250-50 MCG/DOSE AEPB Inhale 1 puff into the lungs 2 (two) times daily.   unk at unk  . gabapentin (NEURONTIN) 600 MG tablet Take 600 mg by mouth 2 (two) times daily.    11/01/2018 at pm  . [EXPIRED] insulin aspart (NOVOLOG FLEXPEN) 100 UNIT/ML FlexPen Inject 10 Units into the skin once as needed (for a BGL of 504).    unk at [employer]  . Insulin Glargine (BASAGLAR KWIKPEN) 100 UNIT/ML SOPN Inject 50 Units into the skin at bedtime.    11/01/2018 at pm  . ipratropium-albuterol (DUONEB) 0.5-2.5 (3) MG/3ML SOLN Take 3 mLs by nebulization every 4 (four) hours as needed. (Patient taking differently: Take 3 mLs by nebulization every 4 (four) hours as needed (for wheezing or shortness of breath). ) 3 mL 0 unk at unk  . lisinopril (PRINIVIL,ZESTRIL) 30 MG tablet Take 30 mg by mouth daily.   11/01/2018 at Unknown time  . Melatonin 10 MG CAPS Take 10 mg by mouth at bedtime.   11/01/2018 at pm  . Menthol, Topical Analgesic, (BIOFREEZE EX) Apply 1 application topically See admin  instructions. Apply to affected areas 2 times a day for pain   unk at unk  . metFORMIN (GLUCOPHAGE) 1000 MG tablet Take 1,000 mg by mouth 2 (two) times daily with a meal.   11/01/2018 at Unknown time  . miconazole (MICRO GUARD) 2 % powder Apply 1 application topically See admin instructions. Apply to skin folds and perineum 2 times a day   unk at unk  . mineral oil-hydrophilic petrolatum (AQUAPHOR) ointment Apply 1 application topically See admin instructions. Apply to dry skin daily and after bathing   unk at unk  . rOPINIRole (REQUIP) 2 MG tablet Take 2 mg by mouth at bedtime.   10/31/2018 at pm  . rosuvastatin (CRESTOR) 20 MG tablet Take 20 mg by mouth at bedtime.   11/01/2018 at pm  . sennosides-docusate sodium (SENOKOT-S) 8.6-50 MG tablet Take 2 tablets by mouth at bedtime.   unk at [employer]  . traZODone (DESYREL) 50 MG tablet Take 50 mg by mouth at bedtime.   11/01/2018 at pm  . Turmeric 500 MG CAPS Take 500 mg by mouth 2 (two) times daily.   11/01/2018 at Unknown time   Scheduled:  . acetaminophen  650 mg Oral Q6H   Or  . acetaminophen  650 mg Rectal Q6H  . docusate sodium  100 mg Oral Daily  . DULoxetine  60 mg Oral Daily  . feeding supplement (GLUCERNA SHAKE)  237 mL Oral BID BM  . gabapentin  600 mg Oral BID  . insulin aspart  0-15 Units Subcutaneous TID WC  . insulin aspart  0-5 Units Subcutaneous QHS  . insulin aspart  6 Units Subcutaneous TID WC  . insulin glargine  28 Units Subcutaneous QHS  . nystatin   Topical BID  . pantoprazole  40 mg Oral Daily  . ramelteon  8 mg Oral QHS  . rosuvastatin  20 mg Oral QHS  . Warfarin - Pharmacist Dosing Inpatient   Does not apply q1800   Infusions:  . sodium chloride    . heparin 1,400 Units/hr (11/07/18 2023)  . lactated ringers Stopped (11/07/18 2221)    Assessment: 39 yoF admitted with abd pain and found to have embolism on CT. Pharmacy consulted for anticoagulation, pt now s/p urgent embolectomy and fasciotomy on 2/16. Pt remains on  heparin, warfarin load started 2/18.   Heparin level therapeutic this morning, INR  subtherapeutic as expected, H/H stable.   Goal of Therapy:  Heparin level 0.3-0.7 units/ml Monitor platelets by anticoagulation protocol: Yes   Plan:  -Continue heparin 1400 units/h -Warfarin 5mg  PO x1 again tonight -Daily INR, heparin level, and CBC   Fredonia Highland, PharmD, BCPS Clinical Pharmacist [phone] Please check AMION for all [facility] Pharmacy numbers 11/08/2018

## 2018-11-09 DIAGNOSIS — D649 Anemia, unspecified: Secondary | ICD-10-CM

## 2018-11-09 LAB — GLUCOSE, CAPILLARY
GLUCOSE-CAPILLARY: 136 mg/dL — AB (ref 70–99)
GLUCOSE-CAPILLARY: 146 mg/dL — AB (ref 70–99)
Glucose-Capillary: 89 mg/dL (ref 70–99)
Glucose-Capillary: 187 mg/dL — ABNORMAL HIGH (ref 70–99)

## 2018-11-09 LAB — CBC
HCT: 24.4 % — ABNORMAL LOW (ref 36.0–46.0)
Hemoglobin: 7.9 g/dL — ABNORMAL LOW (ref 12.0–15.0)
MCH: 30.3 pg (ref 26.0–34.0)
MCHC: 32.4 g/dL (ref 30.0–36.0)
MCV: 93.5 fL (ref 80.0–100.0)
Platelets: 142 10*3/uL — ABNORMAL LOW (ref 150–400)
RBC: 2.61 MIL/uL — ABNORMAL LOW (ref 3.87–5.11)
RDW: 12.6 % (ref 11.5–15.5)
WBC: 10.1 10*3/uL (ref 4.0–10.5)
nRBC: 0 % (ref 0.0–0.2)

## 2018-11-09 LAB — COMPREHENSIVE METABOLIC PANEL
ALK PHOS: 31 U/L — AB (ref 38–126)
ALT: 21 U/L (ref 0–44)
AST: 20 U/L (ref 15–41)
Albumin: 1.9 g/dL — ABNORMAL LOW (ref 3.5–5.0)
Anion gap: 7 (ref 5–15)
BUN: 14 mg/dL (ref 8–23)
CO2: 30 mmol/L (ref 22–32)
Calcium: 8.1 mg/dL — ABNORMAL LOW (ref 8.9–10.3)
Chloride: 99 mmol/L (ref 98–111)
Creatinine, Ser: 1.07 mg/dL — ABNORMAL HIGH (ref 0.44–1.00)
GFR calc Af Amer: 59 mL/min — ABNORMAL LOW (ref >60)
GFR calc non Af Amer: 51 mL/min — ABNORMAL LOW (ref >60)
Glucose, Bld: 117 mg/dL — ABNORMAL HIGH (ref 70–99)
Potassium: 4.2 mmol/L (ref 3.5–5.1)
Sodium: 136 mmol/L (ref 135–145)
Total Bilirubin: 0.4 mg/dL (ref 0.3–1.2)
Total Protein: 4.5 g/dL — ABNORMAL LOW (ref 6.5–8.1)

## 2018-11-09 LAB — HEPARIN LEVEL (UNFRACTIONATED): Heparin Unfractionated: 0.58 IU/mL (ref 0.30–0.70)

## 2018-11-09 LAB — PREPARE RBC (CROSSMATCH)

## 2018-11-09 LAB — PROTIME-INR
INR: 1.81
Prothrombin Time: 20.7 seconds — ABNORMAL HIGH (ref 11.4–15.2)

## 2018-11-09 MED ORDER — SODIUM CHLORIDE 0.9% IV SOLUTION
Freq: Once | INTRAVENOUS | Status: AC
  Administered 2018-11-09: 12:00:00 via INTRAVENOUS

## 2018-11-09 MED ORDER — INSULIN ASPART 100 UNIT/ML ~~LOC~~ SOLN
4.0000 [IU] | Freq: Three times a day (TID) | SUBCUTANEOUS | Status: DC
  Administered 2018-11-10 – 2018-11-12 (×6): 4 [IU] via SUBCUTANEOUS

## 2018-11-09 MED ORDER — OXYCODONE HCL 5 MG PO TABS
5.0000 mg | ORAL_TABLET | ORAL | Status: DC | PRN
  Administered 2018-11-09 – 2018-11-13 (×11): 5 mg via ORAL
  Filled 2018-11-09 (×11): qty 1

## 2018-11-09 NOTE — Progress Notes (Addendum)
ANTICOAGULATION CONSULT NOTE   Pharmacy Consult for heparin + warfarin Indication: DVT  Allergies  Allergen Reactions  . Other Diarrhea, Nausea And Vomiting, Anxiety and Other (See Comments)    Unknown pain medication: caused sweating, nervousness, and "Some new pain med given at [facility], but I don't know what it's called." Patient states "it knocked me out and I got sweaty and they gave me benadryl."     Patient Measurements: Height: 5\' 3"  (160 cm) Weight: 202 lb 9.6 oz (91.9 kg) IBW/kg (Calculated) : 52.4 Heparin Dosing Weight: 73kg  Vital Signs: Temp: 97.6 F (36.4 C) (02/20 0730) Temp Source: Oral (02/20 0730) BP: 123/66 (02/20 0730) Pulse Rate: 76 (02/20 0111)  Labs: Recent Labs    11/07/18 0842  11/07/18 0849 11/07/18 1814 11/08/18 0250 11/09/18 0255  HGB  --    < > 10.2*  --  9.2* 7.9*  HCT  --   --  31.3*  --  28.9* 24.4*  PLT  --   --  157  --  136* 142*  LABPROT  --   --   --   --  13.7 20.7*  INR  --   --   --   --  1.06 1.81  HEPARINUNFRC  --    < >  --  0.61 0.38 0.58  CREATININE 0.87  --   --   --   --  1.07*   < > = values in this interval not displayed.    Estimated Creatinine Clearance: 48.9 mL/min (A) (by C-G formula based on SCr of 1.07 mg/dL (H)).   Medical History: Past Medical History:  Diagnosis Date  . Arthritis   . COPD (chronic obstructive pulmonary disease) (HCC)   . Diabetes mellitus without complication (HCC)   . Hypertension     Medications:  Medications Prior to Admission  Medication Sig Dispense Refill Last Dose  . acetaminophen (TYLENOL) 650 MG CR tablet Take 650 mg by mouth See admin instructions. Take 650 mg by mouth two times a day and an additional 650 mg at midday as needed for breakthrough pain   11/01/2018 at pm  . albuterol (PROVENTIL HFA) 108 (90 Base) MCG/ACT inhaler Inhale 2 puffs into the lungs every 6 (six) hours as needed for wheezing or shortness of breath.    unk at [employer]  . amLODipine (NORVASC) 10  MG tablet Take 10 mg by mouth daily.   11/01/2018 at am  . Artificial Saliva (BIOTENE DRY MOUTH MOISTURIZING) SOLN 1 spray by Mouth Rinse route See admin instructions. Spray in the mouth 6 times a day   unk at unk  . chlorhexidine (PERIDEX) 0.12 % solution 5 mLs See admin instructions. Brush 1 teaspoonful of solution onto teeth and gums after PM mouth care/spit out excess and do not rinse   11/01/2018 at Unknown time  . Cholecalciferol (VITAMIN D3) 1.25 MG (50000 UT) CAPS Take 50,000 Units by mouth every Sunday.   10/22/2018 at Unknown time  . Dextran 70-Hypromellose (ARTIFICIAL TEARS PF OP) Place 1 drop into both eyes 2 (two) times daily.   unk at Unknown time  . diphenhydramine-acetaminophen (TYLENOL PM) 25-500 MG TABS tablet Take 2 tablets by mouth at bedtime.    11/01/2018 at pm  . DULoxetine (CYMBALTA) 60 MG capsule Take 60 mg by mouth daily.   11/01/2018 at am  . Fluticasone-Salmeterol (ADVAIR DISKUS) 250-50 MCG/DOSE AEPB Inhale 1 puff into the lungs 2 (two) times daily.   unk at [employer]  .  gabapentin (NEURONTIN) 600 MG tablet Take 600 mg by mouth 2 (two) times daily.    11/01/2018 at pm  . [EXPIRED] insulin aspart (NOVOLOG FLEXPEN) 100 UNIT/ML FlexPen Inject 10 Units into the skin once as needed (for a BGL of 504).    unk at [employer]  . Insulin Glargine (BASAGLAR KWIKPEN) 100 UNIT/ML SOPN Inject 50 Units into the skin at bedtime.    11/01/2018 at pm  . ipratropium-albuterol (DUONEB) 0.5-2.5 (3) MG/3ML SOLN Take 3 mLs by nebulization every 4 (four) hours as needed. (Patient taking differently: Take 3 mLs by nebulization every 4 (four) hours as needed (for wheezing or shortness of breath). ) 3 mL 0 unk at unk  . lisinopril (PRINIVIL,ZESTRIL) 30 MG tablet Take 30 mg by mouth daily.   11/01/2018 at Unknown time  . Melatonin 10 MG CAPS Take 10 mg by mouth at bedtime.   11/01/2018 at pm  . Menthol, Topical Analgesic, (BIOFREEZE EX) Apply 1 application topically See admin instructions. Apply to affected areas 2 times a  day for pain   unk at unk  . metFORMIN (GLUCOPHAGE) 1000 MG tablet Take 1,000 mg by mouth 2 (two) times daily with a meal.   11/01/2018 at Unknown time  . miconazole (MICRO GUARD) 2 % powder Apply 1 application topically See admin instructions. Apply to skin folds and perineum 2 times a day   unk at unk  . mineral oil-hydrophilic petrolatum (AQUAPHOR) ointment Apply 1 application topically See admin instructions. Apply to dry skin daily and after bathing   unk at unk  . rOPINIRole (REQUIP) 2 MG tablet Take 2 mg by mouth at bedtime.   10/31/2018 at pm  . rosuvastatin (CRESTOR) 20 MG tablet Take 20 mg by mouth at bedtime.   11/01/2018 at pm  . sennosides-docusate sodium (SENOKOT-S) 8.6-50 MG tablet Take 2 tablets by mouth at bedtime.   unk at [employer]  . traZODone (DESYREL) 50 MG tablet Take 50 mg by mouth at bedtime.   11/01/2018 at pm  . Turmeric 500 MG CAPS Take 500 mg by mouth 2 (two) times daily.   11/01/2018 at Unknown time   Scheduled:  . sodium chloride   Intravenous Once  . acetaminophen  650 mg Oral Q6H   Or  . acetaminophen  650 mg Rectal Q6H  . docusate sodium  100 mg Oral Daily  . DULoxetine  60 mg Oral Daily  . feeding supplement (GLUCERNA SHAKE)  237 mL Oral BID BM  . gabapentin  600 mg Oral BID  . insulin aspart  0-15 Units Subcutaneous TID WC  . insulin aspart  0-5 Units Subcutaneous QHS  . insulin aspart  6 Units Subcutaneous TID WC  . insulin glargine  28 Units Subcutaneous QHS  . nystatin   Topical BID  . pantoprazole  40 mg Oral Daily  . ramelteon  8 mg Oral QHS  . rosuvastatin  20 mg Oral QHS  . warfarin   Does not apply Once   Infusions:  . sodium chloride    . lactated ringers Stopped (11/07/18 2221)    Assessment: [age] yoF admitted with abd pain and found to have embolism on CT. Pharmacy consulted for anticoagulation, pt now s/p urgent embolectomy and fasciotomy on 2/16. Pt remains on heparin, warfarin load started 2/18.   Heparin level continues to be therapeutic this  morning, INR subtherapeutic but did see a very significant rise overnight from 1.0>1.8, hgb also trending down from 9.2>7.9, pltc low stable 142.  No drug interactions noted, patient on glucerna supplements which does contain vitamin k. Will follow INR closely.   Addendum: lateral fasciotomy still with bleeding, orders received by vascular to hold heparin and warfarin until bleeding better controlled. Pharmacy to continue to follow closely as INR likely to be >2 tomorrow.    Goal of Therapy:  INR goal 2-3 Heparin level 0.3-0.7 units/ml Monitor platelets by anticoagulation protocol: Yes   Plan:  -Hold anticoagulation for now until bleeding controlled.  Sheppard Coil PharmD., BCPS Clinical Pharmacist 11/09/2018 9:28 AM

## 2018-11-09 NOTE — Progress Notes (Signed)
Physical Therapy Treatment Patient Details Name: Gabriella [name] MRN: [id number] DOB: [birth year] Today's Date: 11/09/2018    History of Present Illness Gabriella [name] is a 78 year old woman with pMH of HTN, DM2, COPD, arthritis who presented with a diarrheal illness, guaiac + stool and abdominal pain.  She was dizzy and weak at home, she was noted to have enteritis on CT scan.  She also reported severe left leg pain. She met sepsis criteria. [facility] is [facility] in the [facility]. Pt s/p Left popliteal and tibial embolectomy, 4 compartment fasciotomy, vein patch angioplasty left popliteal artery 11/05/2018.    PT Comments    Pt with seemingly improved LLE pain control this session, tolerating stand pivot transfer with walker to bedside commode with moderate assistance provided. Pt endorsing mild lightheadedness but BP WFL. Deferred further ambulation due to sanguineous drainage from surgical site (wound vac to be placed by RN shortly). Will progress mobility as tolerated.    Follow Up Recommendations  SNF     Equipment Recommendations  None recommended by PT    Recommendations for Other Services       Precautions / Restrictions Precautions Precautions: Fall Precaution Comments: LLE sx 11/05/2018 with fasciotomy x4 Restrictions Weight Bearing Restrictions: No    Mobility  Bed Mobility Overal bed mobility: Needs Assistance Bed Mobility: Supine to Sit;Sit to Supine     Supine to sit: Min assist Sit to supine: Min assist   General bed mobility comments: min assist for negotiation of LLE  Transfers Overall transfer level: Needs assistance Equipment used: Rolling walker (2 wheeled) Transfers: Sit to/from [facility] Sit to Stand: Mod assist Stand pivot transfers: Mod assist       General transfer comment: ModA to stand and pivot to and from [facility]. Pt requiring cues for hand placement and sequencing. Able to take side steps along edge of bed upon return.  Ambulation/Gait                  Stairs             Wheelchair Mobility    Modified Rankin (Stroke Patients Only)       Balance Overall balance assessment: Needs assistance;History of Falls Sitting-balance support: Feet supported;No upper extremity supported Sitting balance-Leahy Scale: Good     Standing balance support: Bilateral upper extremity supported;During functional activity Standing balance-Leahy Scale: Poor Standing balance comment: dependent on BUE                            Cognition Arousal/Alertness: Awake/alert Behavior During Therapy: WFL for tasks assessed/performed Overall Cognitive Status: No family/caregiver present to determine baseline cognitive functioning                                 General Comments: pt seemingly more alert and oriented this session       Exercises General Exercises - Lower Extremity Ankle Circles/Pumps: 20 reps;Both;Supine Long Arc Quad: Both;Seated;20 reps Hip Flexion/Marching: 20 reps;Both;Seated    General Comments        Pertinent Vitals/Pain Pain Assessment: Faces Faces Pain Scale: Hurts little more Pain Location: LLE Pain Descriptors / Indicators: Aching;Sore;Throbbing Pain Intervention(s): Monitored during session    Home Living                      Prior Function  PT Goals (current goals can now be found in the care plan section) Acute Rehab PT Goals Patient Stated Goal: get back home to go to PACE PT Goal Formulation: With patient Time For Goal Achievement: 11/18/18 Potential to Achieve Goals: Fair Progress towards PT goals: Progressing toward goals    Frequency    Min 3X/week      PT Plan Current plan remains appropriate    Co-evaluation PT/OT/SLP Co-Evaluation/Treatment: Yes Reason for Co-Treatment: For patient/therapist safety;To address functional/ADL transfers PT goals addressed during session: Mobility/safety with mobility        AM-PAC  PT "6 Clicks" Mobility   Outcome Measure  Help needed turning from your back to your side while in a flat bed without using bedrails?: None Help needed moving from lying on your back to sitting on the side of a flat bed without using bedrails?: A Little Help needed moving to and from a bed to a chair (including a wheelchair)?: A Lot Help needed standing up from a chair using your arms (e.g., wheelchair or bedside chair)?: A Lot Help needed to walk in hospital room?: A Lot Help needed climbing 3-5 steps with a railing? : Total 6 Click Score: 14    End of Session Equipment Utilized During Treatment: Gait belt Activity Tolerance: Patient tolerated treatment well Patient left: in bed;with call bell/phone within reach;with bed alarm set Nurse Communication: Mobility status PT Visit Diagnosis: Unsteadiness on feet (R26.81);Muscle weakness (generalized) (M62.81);Difficulty in walking, not elsewhere classified (R26.2);Pain Pain - Right/Left: Left Pain - part of body: Leg     Time: 1425-1445 PT Time Calculation (min) (ACUTE ONLY): 20 min  Charges:  $Therapeutic Exercise: 8-22 mins                    Ellamae Sia, PT, DPT Acute Rehabilitation Services Pager [phone] Office [phone]    Willy Eddy 11/09/2018, 2:54 PM

## 2018-11-09 NOTE — Progress Notes (Signed)
Internal Medicine Attending:   I saw and examined the patient. I reviewed the resident's note and I agree with the resident's findings and plan as documented in the resident's note.  Patient states that she feels well today but still has some persistent pain in her left leg.  She denies any other complaints at this time.  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  Patient is status post embolectomy with fasciotomy of her left lower extremity.  The etiology of her arterial thrombi remains uncertain at this time.  2D echo with no evidence of clot and CT angio chest/abdomen/pelvis with no evidence of dissection or aneurysm or intramural hematomas.  We will continue with anticoagulation per pharmacy (patient to be on warfarin long-term).  Vascular surgery follow-up and recommendations appreciated.  Patient noted to have worsening anemia likely secondary to blood loss from her left lower extremity.  Heparin was DC'd per vascular surgery.  We will continue warfarin only for now.  Patient to be transfused 2 units PRBC per vascular surgery.  No further work-up at this time.

## 2018-11-09 NOTE — [place] FL2 (Signed)
[place] MEDICAID FL2 LEVEL OF CARE SCREENING TOOL     IDENTIFICATION  Patient Name: Gabriella [name] Birthdate: 11-05-1940 Sex: female Admission Date (Current Location): 11/02/2018  [facility] and [place] Number:  Producer, television/film/video and Address:  [facility] [place], [address], [place], [place] [address]      Provider Number: [id number]  Attending Physician Name and Address:  Earl Lagos, MD  Relative Name and Phone Number:  Heide Guile [phone]    Current Level of Care: SNF Recommended Level of Care: Skilled Nursing Facility Prior Approval Number:    Date Approved/Denied:   PASRR Number: [id number]  Discharge Plan: SNF    Current Diagnoses: Patient Active Problem List   Diagnosis Date Noted  . Peripheral vascular disease (HCC) 11/05/2018  . AKI (acute kidney injury) (HCC)   . Diarrhea   . Hyperglycemia   . Gastroenteritis 11/02/2018  . Pressure injury of skin 12/04/2017  . Diabetic ketoacidosis (HCC) 12/03/2017  . Acute respiratory failure with hypoxia (HCC) 12/02/2017  . Influenza B 12/02/2017    Orientation RESPIRATION BLADDER Height & Weight     Self, Time, Situation, Place  Normal Continent Weight: 202 lb 9.6 oz (91.9 kg) Height:  5\' 3"  (160 cm)  BEHAVIORAL SYMPTOMS/MOOD NEUROLOGICAL BOWEL NUTRITION STATUS      Continent Diet(please see discharge summary )  AMBULATORY STATUS COMMUNICATION OF NEEDS Skin     Verbally Surgical wounds(incision(closed) left, leg)                       Personal Care Assistance Level of Assistance  Bathing, Feeding, Dressing Bathing Assistance: Limited assistance Feeding assistance: Independent Dressing Assistance: Limited assistance     Functional Limitations Info  Sight, Hearing, Speech Sight Info: Adequate Hearing Info: Adequate Speech Info: Adequate    SPECIAL CARE FACTORS FREQUENCY  PT (By licensed PT), OT (By licensed OT)     PT Frequency: 3x per week  OT Frequency: 3x per  week             Contractures Contractures Info: Not present    Additional Factors Info  Code Status, Allergies Code Status Info: FULL Allergies Info: Other           Current Medications (11/09/2018):  This is the current hospital active medication list Current Facility-Administered Medications  Medication Dose Route Frequency Provider Last Rate Last Dose  . 0.9 %  sodium chloride infusion  500 mL Intravenous Once PRN Rhyne, Samantha J, PA-C      . acetaminophen (TYLENOL) tablet 650 mg  650 mg Oral Q6H Rhyne, Samantha J, PA-C   650 mg at 11/09/18 1050   Or  . acetaminophen (TYLENOL) suppository 650 mg  650 mg Rectal Q6H Rhyne, Samantha J, PA-C      . diclofenac sodium (VOLTAREN) 1 % transdermal gel 2 g  2 g Topical QID PRN Rhyne, Samantha J, PA-C      . docusate sodium (COLACE) capsule 100 mg  100 mg Oral Daily Rhyne, Samantha J, PA-C   100 mg at 11/09/18 1050  . DULoxetine (CYMBALTA) DR capsule 60 mg  60 mg Oral Daily Rhyne, Samantha J, PA-C   60 mg at 11/09/18 1050  . feeding supplement (GLUCERNA SHAKE) (GLUCERNA SHAKE) liquid 237 mL  237 mL Oral BID BM Rhyne, Samantha J, PA-C   237 mL at 11/09/18 1455  . fentaNYL (SUBLIMAZE) injection 25-50 mcg  25-50 mcg Intravenous Q2H PRN Rhyne, Ames Coupe, PA-C  50 mcg at 11/09/18 1455  . gabapentin (NEURONTIN) tablet 600 mg  600 mg Oral BID Rhyne, Samantha J, PA-C   600 mg at 11/09/18 1050  . insulin aspart (novoLOG) injection 0-15 Units  0-15 Units Subcutaneous TID WC Rhyne, Samantha J, PA-C   2 Units at 11/09/18 1156  . insulin aspart (novoLOG) injection 0-5 Units  0-5 Units Subcutaneous QHS Dara Lords, PA-C   3 Units at 11/08/18 2129  . insulin aspart (novoLOG) injection 4 Units  4 Units Subcutaneous TID WC Hermine Messick M, MD      . insulin glargine (LANTUS) injection 28 Units  28 Units Subcutaneous QHS Dara Lords, PA-C   28 Units at 11/08/18 2129  . ipratropium-albuterol (DUONEB) 0.5-2.5 (3) MG/3ML nebulizer solution 3  mL  3 mL Nebulization Q6H PRN Rhyne, Samantha J, PA-C      . lactated ringers infusion   Intravenous Continuous Rhyne, Ames Coupe, PA-C   Stopped at 11/07/18 2221  . nystatin (MYCOSTATIN/NYSTOP) topical powder   Topical BID Rhyne, Samantha J, PA-C      . ondansetron (ZOFRAN) tablet 4 mg  4 mg Oral Q6H PRN Rhyne, Samantha J, PA-C       Or  . ondansetron (ZOFRAN) injection 4 mg  4 mg Intravenous Q6H PRN Rhyne, Samantha J, PA-C   4 mg at 11/05/18 0457  . oxyCODONE (Oxy IR/ROXICODONE) immediate release tablet 5 mg  5 mg Oral Q4H PRN Sherren Kerns, MD   5 mg at 11/09/18 1050  . pantoprazole (PROTONIX) EC tablet 40 mg  40 mg Oral Daily Rhyne, Samantha J, PA-C   40 mg at 11/09/18 1050  . phenol (CHLORASEPTIC) mouth spray 1 spray  1 spray Mouth/Throat PRN Rhyne, Samantha J, PA-C      . ramelteon (ROZEREM) tablet 8 mg  8 mg Oral QHS Rhyne, Samantha J, PA-C   8 mg at 11/08/18 2132  . rosuvastatin (CRESTOR) tablet 20 mg  20 mg Oral QHS Rhyne, Samantha J, PA-C   20 mg at 11/08/18 2128     Discharge Medications: Please see discharge summary for a list of discharge medications.  Relevant Imaging Results:  Relevant Lab Results:   Additional Information SSN [id number]  Eduard Roux, LCSWA

## 2018-11-09 NOTE — Progress Notes (Signed)
Occupational Therapy Treatment Patient Details Name: Gabriella [name] MRN: [id number] DOB: 1941-07-11 Today's Date: 11/09/2018    History of present illness Gabriella [name] is a 78 year old woman with PMH of HTN, DM2, COPD, arthritis who presented with a diarrhea illness, guaiac + stool and abdominal pain.  She was dizzy and weak at home, she was noted to have enteritis on CT scan.  She also reported severe left leg pain. She met sepsis criteria. [facility]. Pt s/p Left popliteal and tibial embolectomy, 4 compartment fasciotomy, vein patch angioplasty left popliteal artery 11/05/2018.   OT comments  Pt progressing with therapy. Pt performing bed mobility, transfers and side stepping to achieve proper positioning in bed. Pt performing ADLs with set-upA for UB and mod to maxA for LB ADL. Session limited as LLE expecting wound vac per RN with noticeable bleeding. Pt careful with RW and assuming pt to progress to +1 assist soon. Pt continues to progress and would benefit from continued OT skilled services for ADL, mobility and safety in SNF setting.    Follow Up Recommendations  SNF;Supervision/Assistance - 24 hour    Equipment Recommendations  Other (comment)(TBD)    Recommendations for Other Services      Precautions / Restrictions Precautions Precautions: Fall Precaution Comments: LLE sx 11/05/2018 with fasciotomy x4 Restrictions Weight Bearing Restrictions: No       Mobility Bed Mobility Overal bed mobility: Needs Assistance Bed Mobility: Supine to Sit;Sit to Supine     Supine to sit: Min assist;HOB elevated Sit to supine: HOB elevated   General bed mobility comments: (minA for BLE movement)  Transfers Overall transfer level: Needs assistance Equipment used: Rolling walker (2 wheeled) Transfers: Sit to/from [facility] Sit to Stand: Mod assist Stand pivot transfers: Mod assist       General transfer comment: ModA to stand and pivot to and  from [facility]. Pt requiring cues for hand placement and sequencing. Able to take side steps along edge of bed upon return.    Balance Overall balance assessment: Needs assistance Sitting-balance support: Feet supported;No upper extremity supported Sitting balance-Leahy Scale: Good Sitting balance - Comments: lightheaded intermittently   Standing balance support: Bilateral upper extremity supported;During functional activity Standing balance-Leahy Scale: Poor Standing balance comment: dependent on BUE               High Level Balance Comments: cues for proper technique           ADL either performed or assessed with clinical judgement   ADL Overall ADL's : Needs assistance/impaired                                     Functional mobility during ADLs: Moderate assistance;Rolling walker General ADL Comments: set-upA for UB ADL and modA to MaxA for LB      Vision   Vision Assessment?: No apparent visual deficits   Perception     Praxis      Cognition Arousal/Alertness: Awake/alert Behavior During Therapy: WFL for tasks assessed/performed Overall Cognitive Status: No family/caregiver present to determine baseline cognitive functioning                                 General Comments: pt seemingly more alert and oriented this session         Exercises Exercises: General Upper  Extremity General Exercises - Upper Extremity Shoulder Flexion: AROM;10 reps;Supine General Exercises - Lower Extremity Ankle Circles/Pumps: 20 reps;Both;Supine Long Arc Quad: Both;Seated;20 reps Hip Flexion/Marching: 20 reps;Both;Seated   Shoulder Instructions       General Comments      Pertinent Vitals/ Pain       Pain Assessment: Faces Faces Pain Scale: Hurts little more Pain Location: LLE Pain Descriptors / Indicators: Aching;Sore Pain Intervention(s): Monitored during session  Home Living                                           Prior Functioning/Environment              Frequency  Min 2X/week        Progress Toward Goals  OT Goals(current goals can now be found in the care plan section)  Progress towards OT goals: Progressing toward goals  Acute Rehab OT Goals Patient Stated Goal: get back home to go to [facility] OT Goal Formulation: With patient Time For Goal Achievement: 11/18/18 Potential to Achieve Goals: Good ADL Goals Pt Will Perform Lower Body Bathing: with modified independence;with adaptive equipment;sitting/lateral leans Pt Will Perform Lower Body Dressing: with min guard assist;sit to/from stand Pt Will Transfer to Toilet: with supervision;ambulating Pt Will Perform Toileting - Clothing Manipulation and hygiene: with supervision;sit to/from stand Additional ADL Goal #1: Pt will perform bed mobility at mod I level prior to engaging in ADL  Plan Discharge plan remains appropriate;Frequency remains appropriate    Co-evaluation    PT/OT/SLP Co-Evaluation/Treatment: Yes Reason for Co-Treatment: For patient/therapist safety PT goals addressed during session: Mobility/safety with mobility OT goals addressed during session: ADL's and self-care;Strengthening/ROM      AM-PAC OT "6 Clicks" Daily Activity     Outcome Measure   Help from another person eating meals?: None Help from another person taking care of personal grooming?: A Little Help from another person toileting, which includes using toliet, bedpan, or urinal?: A Lot Help from another person bathing (including washing, rinsing, drying)?: A Lot Help from another person to put on and taking off regular upper body clothing?: A Little Help from another person to put on and taking off regular lower body clothing?: A Little 6 Click Score: 17    End of Session Equipment Utilized During Treatment: Gait belt  OT Visit Diagnosis: Unsteadiness on feet (R26.81);Other abnormalities of gait and mobility (R26.89);Pain;History of falling  (Z91.81);Muscle weakness (generalized) (M62.81) Pain - Right/Left: Left Pain - part of body: Leg   Activity Tolerance Patient limited by pain;Patient tolerated treatment well   Patient Left in bed;with bed alarm set;with call bell/phone within reach   Nurse Communication Mobility status        Time: 1425-1445 OT Time Calculation (min): 20 min  Charges: OT General Charges $OT Visit: 1 Visit OT Treatments $Neuromuscular Re-education: 8-22 mins  Darryl Nestle) Marsa Aris OTR/L Acute Rehabilitation Services Pager: [phone] Office: [phone]    Fredda Hammed 11/09/2018, 3:54 PM

## 2018-11-09 NOTE — Progress Notes (Signed)
[facility]  Subjective  - left leg still hurts   Objective 123/66 76 97.6 F (36.4 C) (Oral) 12 94%  Intake/Output Summary (Last 24 hours) at 11/09/2018 0759 Last data filed at 11/09/2018 0310 Gross per 24 hour  Intake 1229.03 ml  Output 1075 ml  Net 154.03 ml   Left foot pink warm triphasic doppler some leg edema Lateral fasciotomy still with some venous bleeding  Assessment/Planning: Acute blood loss anemia will transfuse D/c heparin warfarin for now until bleeding is better controlled VAC to fasciotomy today  Gabriella [name] 11/09/2018 7:59 AM --  Laboratory Lab Results: Recent Labs    11/08/18 0250 11/09/18 0255  WBC 10.0 10.1  HGB 9.2* 7.9*  HCT 28.9* 24.4*  PLT 136* 142*   BMET Recent Labs    11/07/18 0842 11/09/18 0255  NA 137 136  K 3.9 4.2  CL 100 99  CO2 28 30  GLUCOSE 164* 117*  BUN 9 14  CREATININE 0.87 1.07*  CALCIUM 8.4* 8.1*    COAG Lab Results  Component Value Date   INR 1.81 11/09/2018   INR 1.06 11/08/2018   INR 1.15 11/02/2018   No results found for: PTT

## 2018-11-09 NOTE — Care Management Important Message (Signed)
Important Message  Patient Details  Name: Gabriella [name] MRN: [id number] Date of Birth: Jan 31, 1941   Medicare Important Message Given:  Yes    [name] 11/09/2018, 12:00 PM

## 2018-11-09 NOTE — Progress Notes (Signed)
Inpatient Diabetes Program Recommendations  AACE/ADA: New Consensus Statement on Inpatient Glycemic Control (2015)  Target Ranges:  Prepandial:   less than 140 mg/dL      Peak postprandial:   less than 180 mg/dL (1-2 hours)      Critically ill patients:  140 - 180 mg/dL   Lab Results  Component Value Date   GLUCAP 136 (H) 11/09/2018   HGBA1C 11.5 (H) 11/02/2018    Review of Glycemic Control Results for Gabriella [name], Gabriella [name] (MRN [id number]) as of 11/09/2018 11:27  Ref. Range 11/08/2018 16:06 11/08/2018 18:02 11/08/2018 21:15 11/09/2018 05:59 11/09/2018 11:23  Glucose-Capillary Latest Ref Range: 70 - 99 mg/dL 62 (L) 562 (H) 130 (H) 89 136 (H)   Diabetes history: DM 2 Outpatient Diabetes medications: Basaglar 50 units q HS Current orders for Inpatient glycemic control:  Novolog moderate tid with meals and HS, Lantus 28 units q HS, Novolog 6 units tid with meals  Inpatient Diabetes Program Recommendations:    Note mild low on 2/19- Please consider reducing Novolog meal coverage to 4 units tid with meals.   Thanks  Beryl Meager, RN, BC-ADM Inpatient Diabetes Coordinator Pager [phone] (8a-5p)

## 2018-11-09 NOTE — Progress Notes (Signed)
Subjective:  Gabriella [name] was doing well this morning, she reported that she is still having left leg pain and that the pain medications help but only last for a short while. She denied any other issues today. We discussed the results of the chest CT yesterday. We discussed that she will likely need an blood transfusion today, she reported that she was anxious about getting HIV and was discussed the risks and benefits of a blood transfusion and that getting HIV is a rare complications nowadays. We discussed the plan for today and she is in agreement.   Objective:  Vital signs in last 24 hours: Vitals:   11/08/18 0756 11/08/18 1700 11/08/18 1937 11/09/18 0111  BP: 123/68 119/63 136/77 124/73  Pulse: 81  85 76  Resp: 14 12 15 13   Temp: 98.1 F (36.7 C) 98.6 F (37 C) 97.6 F (36.4 C) 98.2 F (36.8 C)  TempSrc: Oral Oral Oral Oral  SpO2: 93% 96% 93% 90%  Weight:      Height:        General: Frail appearing, NAD, sitting up in chair Cardiac: RRR, systolic murmur Pulmonary: Lungs CTABL, no wheezing or rhonchi Abdomen: Soft, minimal TTP, non-distended, +BS Extremity: Left leg in bandage, good pulses and warm extremities  Assessment/Plan:  Principal Problem:   Gastroenteritis Active Problems:   AKI (acute kidney injury) (HCC)   Diarrhea   Hyperglycemia   Peripheral vascular disease (HCC)   This is a 78 year old female with a history of DM, COPD, HTN, and arthritis who presented with worsening diarrhea, abdominal pain, and generalized weakness. Found to be tachycardic, tachypneic, with soft blood pressures. Labs are significant for metabolic acidosis, creatinine 2.2, baseline 0.8, elevated lactic acid of 5.2, and leukocytosis of 26. Found to have intermittent leg pains, decreased pulses and cool extremities. LE u/s showed no DVT but artery occlusions. CTA showed embolus to LLE and she was taken for emergent embolectomy and fasciotomy.   Abdominal pain 2/2 gastroenteritis: Resolved,  remains afebrile. Has been eating and drinking well.  -Continue zofran and fentanyl PRN for pain control -Carb modified diet  -Continue maintenance fluids  Left LE emboli in popliteal and tibial artery  S/p left popliteal and tibial embolectomy, 4 compartment fasciotomy, and vein patch angioplasty of the left popliteal artery on 2/16. She continues to do well, remains afebrile with decreasing leukocytosis, today it it at 10. . She is still requiring frequent doses of pain medications but she reports that her pain is well controlled with this.  -Echocardiogram showed EF 60-65%, LVH, G1DD, normal biatrial size, normal IVC, no wall motion abnormalities, no clots seen.  -Continuepercocet PRN -Continue fentanyl PRN -Continue coumadin per pharmacy, discontinue heparin today -Vascular surgery following, appreciate recommendations, plan fasciotomy closures based on muscle appearance, will change dressing today > No plans to return to OR today -PT/OT > recommends SNF, spoke with [facility] physician who reports that they can provide PT/OT -Patient agreeable to SNF placement -CTA chest > no explanation for arterial emboli, no thoracic aneurysm, dissection or intramural hematoma. Can consider a cardiac echo. Indeterminate right middle and lower lobe pulmonary nodules, largest in right lower lobe, no follow up if low risk or non-contrast chest CT in 12 months if high risk.  -May consider a TEE on discharge -Antiphospholipid panel pending  Normocytic anemia: -Decrease Hgb today at 7.9, down from 9.2 yesterday. Nurse noted more bleeding from her wound last night. She is still on coumadin and heparin at this time  which may be contributing to the increased bleeding. Will discuss with pharmacy about stopping heparin today.  -Daily CBC -Transfuse 2 units pRBC per vascular -F/u H+H following transfusion -Transfuse if Hgb <7  Uncontrolled Diabetes: She is on insulin and metformin at home. A1c here was 11.5. CBGs  have remained in a good range -Continuelantus 28 units daily -Decrease novolog 4 units TID WC -SSI  AKI: Resolved with IV fluids.  -Can discontinue fluids today, she has been eating and drinking well with no issues.  -Repeat BMP in AM  Candidal intertrigo: -Continue nystatin powder  COPD:  -Continue duonebs PRN  FEN: No fluids, replete lytes prn, carb mod VTE ppx: lovenox Code Status: FULL  Dispo: Anticipated discharge in approximately 1-2 day(s).   Claudean Severance, MD 11/09/2018, 7:14 AM Pager: [phone]

## 2018-11-10 DIAGNOSIS — Z9889 Other specified postprocedural states: Secondary | ICD-10-CM

## 2018-11-10 DIAGNOSIS — Z978 Presence of other specified devices: Secondary | ICD-10-CM

## 2018-11-10 LAB — TYPE AND SCREEN
ABO/RH(D): B POS
Antibody Screen: NEGATIVE
UNIT DIVISION: 0
Unit division: 0

## 2018-11-10 LAB — COMPREHENSIVE METABOLIC PANEL
ALT: 18 U/L (ref 0–44)
AST: 21 U/L (ref 15–41)
Albumin: 2.1 g/dL — ABNORMAL LOW (ref 3.5–5.0)
Alkaline Phosphatase: 33 U/L — ABNORMAL LOW (ref 38–126)
Anion gap: 6 (ref 5–15)
BILIRUBIN TOTAL: 0.6 mg/dL (ref 0.3–1.2)
BUN: 14 mg/dL (ref 8–23)
CALCIUM: 8 mg/dL — AB (ref 8.9–10.3)
CO2: 30 mmol/L (ref 22–32)
CREATININE: 0.91 mg/dL (ref 0.44–1.00)
Chloride: 100 mmol/L (ref 98–111)
GFR calc Af Amer: >60 mL/min (ref >60)
GFR calc non Af Amer: >60 mL/min (ref >60)
Glucose, Bld: 140 mg/dL — ABNORMAL HIGH (ref 70–99)
Potassium: 4.2 mmol/L (ref 3.5–5.1)
Sodium: 136 mmol/L (ref 135–145)
TOTAL PROTEIN: 4.9 g/dL — AB (ref 6.5–8.1)

## 2018-11-10 LAB — GLUCOSE, CAPILLARY
GLUCOSE-CAPILLARY: 86 mg/dL (ref 70–99)
Glucose-Capillary: 233 mg/dL — ABNORMAL HIGH (ref 70–99)
Glucose-Capillary: 206 mg/dL — ABNORMAL HIGH (ref 70–99)
Glucose-Capillary: 197 mg/dL — ABNORMAL HIGH (ref 70–99)

## 2018-11-10 LAB — CBC
HCT: 29.5 % — ABNORMAL LOW (ref 36.0–46.0)
Hemoglobin: 9.6 g/dL — ABNORMAL LOW (ref 12.0–15.0)
MCH: 29.4 pg (ref 26.0–34.0)
MCHC: 32.5 g/dL (ref 30.0–36.0)
MCV: 90.5 fL (ref 80.0–100.0)
Platelets: 142 10*3/uL — ABNORMAL LOW (ref 150–400)
RBC: 3.26 MIL/uL — ABNORMAL LOW (ref 3.87–5.11)
RDW: 13.8 % (ref 11.5–15.5)
WBC: 8.6 10*3/uL (ref 4.0–10.5)
nRBC: 0 % (ref 0.0–0.2)

## 2018-11-10 LAB — BPAM RBC
BLOOD PRODUCT EXPIRATION DATE: 202003112359
Blood Product Expiration Date: 202003122359
ISSUE DATE / TIME: 202002201735
ISSUE DATE / TIME: 202002201213
Unit Type and Rh: 7300
Unit Type and Rh: 7300

## 2018-11-10 LAB — PROTIME-INR
INR: 1.75
Prothrombin Time: 20.2 seconds — ABNORMAL HIGH (ref 11.4–15.2)

## 2018-11-10 LAB — HEMOGLOBIN AND HEMATOCRIT, BLOOD
HCT: 28.8 % — ABNORMAL LOW (ref 36.0–46.0)
Hemoglobin: 9.6 g/dL — ABNORMAL LOW (ref 12.0–15.0)

## 2018-11-10 LAB — HEPARIN LEVEL (UNFRACTIONATED): Heparin Unfractionated: <0.1 IU/mL — ABNORMAL LOW (ref 0.30–0.70)

## 2018-11-10 MED ORDER — POLYETHYLENE GLYCOL 3350 17 G PO PACK
17.0000 g | PACK | Freq: Every day | ORAL | Status: DC
  Administered 2018-11-10 – 2018-11-14 (×5): 17 g via ORAL
  Filled 2018-11-10 (×5): qty 1

## 2018-11-10 MED ORDER — HEPARIN (PORCINE) 25000 UT/250ML-% IV SOLN
1500.0000 [IU]/h | INTRAVENOUS | Status: DC
  Administered 2018-11-10: 1400 [IU]/h via INTRAVENOUS
  Administered 2018-11-11 (×2): 1600 [IU]/h via INTRAVENOUS
  Filled 2018-11-10 (×3): qty 250

## 2018-11-10 MED ORDER — WARFARIN - PHARMACIST DOSING INPATIENT
Freq: Every day | Status: DC
  Administered 2018-11-10 – 2018-11-13 (×3)

## 2018-11-10 MED ORDER — WARFARIN SODIUM 5 MG PO TABS
5.0000 mg | ORAL_TABLET | Freq: Once | ORAL | Status: AC
  Administered 2018-11-10: 5 mg via ORAL
  Filled 2018-11-10: qty 1

## 2018-11-10 NOTE — Clinical Social Work Note (Signed)
Clinical Social Work Assessment  Patient Details  Name: Gabriella [name] MRN: [id number] Date of Birth: Feb 25, 1941  Date of referral:  11/10/18               Reason for consult:  Facility Placement                Permission sought to share information with:  Facility Medical sales representative, Family Supports Permission granted to share information::  Yes, Verbal Permission Granted  Name::     Marlinda Mike   Agency::  SNFs  Relationship::  daughter  Contact Information:  [phone]  Housing/Transportation Living arrangements for the past 2 months:  Single Family Home Source of Information:  Patient, Adult Children, Case Manager([facility]) Patient Interpreter Needed:  None Criminal Activity/Legal Involvement Pertinent to Current Situation/Hospitalization:  No - Comment as needed Significant Relationships:  Adult Children Lives with:  Adult Children Do you feel safe going back to the place where you live?  No Need for family participation in patient care:  Yes (Comment)  Care giving concerns:  CSW received consult for discharge needs. CSW spoke with patient and her daughter regarding PT recommendation of SNF placement at time of discharge. Patient daughter and son is in the home with her. Patient's daughter states her mother has had mulitple falls and would like for her mother to get some rehab and get stronger before returning home. Patient agreed to stay " for a little while".    Social Worker assessment / plan:  CSW spoke with patient and her daughter concerning possibility of rehab at [facility] before returning home.   Employment status:  Other (Comment) Insurance information:  Other (Comment Required)([facility]) PT Recommendations:  Skilled Nursing Facility Information / Referral to community resources:  [facility]  Patient/Family's Response to care:  Patient recognizes need for rehab before returning home and is agreeable to a SNF placement. Patient reported preference for [facility].  Patient/Family's  Understanding of and Emotional Response to Diagnosis, Current Treatment, and Prognosis:  Patient and family  is realistic regarding therapy needs and expressed being hopeful for SNF placement. Patient expressed understanding of CSW role and discharge process as well as medical condition. No questions/concerns about plan or treatment at this time.   Emotional Assessment Appearance:  Appears stated age Attitude/Demeanor/Rapport:  Engaged Affect (typically observed):  Appropriate Orientation:  Oriented to Self, Oriented to Place, Oriented to  Time, Oriented to Situation Alcohol / Substance use:  Not Applicable Psych involvement (Current and /or in the community):  No (Comment)  Discharge Needs  Concerns to be addressed:  Care Coordination Readmission within the last 30 days:  No Current discharge risk:  Dependent with Mobility Barriers to Discharge:  Continued Medical Work up   [employer], LCSWA 11/10/2018, 2:30 PM

## 2018-11-10 NOTE — Progress Notes (Signed)
Occupational Therapy Treatment Patient Details Name: Gabriella [name] MRN: [id number] DOB: 1941-09-12 Today's Date: 11/10/2018    History of present illness Gabriella [name] is a 78 year old woman with PMH of HTN, DM2, COPD, arthritis who presented with a diarrhea illness, guaiac + stool and abdominal pain.  She was dizzy and weak at home, she was noted to have enteritis on CT scan.  She also reported severe left leg pain. She met sepsis criteria. [facility]. Pt s/p Left popliteal and tibial embolectomy, 4 compartment fasciotomy, vein patch angioplasty left popliteal artery 11/05/2018.   OT comments  Pt performing bed mobility with modA mostly for trunk elevation. Pt maneuvering BLEs with assist for wound vac wires. [facility] with [facility] with minA and pt requires maxA for hygiene. Pt performing UB ADLs with set-up to minA. Pt with fair balance in sitting and requires UE support of RW for stability, but continues with fair balance in standing. Pt progressing and pain permits in LLE. Pt would greatly benefit from continued OT skilled services for ADL, mobility and safety in SNF setting. OT to follow acutely.     Follow Up Recommendations  SNF;Supervision/Assistance - 24 hour    Equipment Recommendations  Other (comment)    Recommendations for Other Services      Precautions / Restrictions Precautions Precautions: Fall Precaution Comments: LLE sx 11/05/2018 with fasciotomy x4 Restrictions Weight Bearing Restrictions: No       Mobility Bed Mobility Overal bed mobility: Needs Assistance Bed Mobility: Supine to Sit;Sit to Supine     Supine to sit: Min assist;HOB elevated Sit to supine: HOB elevated   General bed mobility comments: min assist for negotiation of LLE  Transfers Overall transfer level: Needs assistance Equipment used: Rolling walker (2 wheeled) Transfers: Sit to/from [facility] Sit to Stand: Mod assist Stand pivot  transfers: Mod assist       General transfer comment: ModA to stand and pivot to and from [facility]. Pt requiring cues for hand placement and sequencing. Able to take side steps along edge of bed upon return.    Balance Overall balance assessment: Needs assistance Sitting-balance support: Feet supported;No upper extremity supported Sitting balance-Leahy Scale: Good Sitting balance - Comments: lightheaded intermittently   Standing balance support: Bilateral upper extremity supported;During functional activity Standing balance-Leahy Scale: Poor Standing balance comment: dependent on BUE                           ADL either performed or assessed with clinical judgement   ADL Overall ADL's : Needs assistance/impaired                                     Functional mobility during ADLs: Moderate assistance;Rolling walker General ADL Comments: set-upA for UB ADL and modA to MaxA for LB      Vision   Vision Assessment?: No apparent visual deficits   Perception     Praxis      Cognition Arousal/Alertness: Awake/alert Behavior During Therapy: WFL for tasks assessed/performed Overall Cognitive Status: No family/caregiver present to determine baseline cognitive functioning                                 General Comments: pt seemingly more alert and oriented this session  Exercises General Exercises - Upper Extremity Shoulder Flexion: AROM;10 reps;Supine   Shoulder Instructions       General Comments Pt tolerating mobility, but too fatigued to ambulate this PM.    Pertinent Vitals/ Pain       Faces Pain Scale: Hurts little more Pain Location: LLE Pain Descriptors / Indicators: Aching;Sore Pain Intervention(s): Limited activity within patient's tolerance;RN gave pain meds during session  Home Living                                          Prior Functioning/Environment              Frequency  Min  2X/week        Progress Toward Goals  OT Goals(current goals can now be found in the care plan section)  Progress towards OT goals: Progressing toward goals  Acute Rehab OT Goals Patient Stated Goal: get back home to go to [facility] OT Goal Formulation: With patient Time For Goal Achievement: 11/18/18 Potential to Achieve Goals: Good ADL Goals Pt Will Perform Lower Body Bathing: with modified independence;with adaptive equipment;sitting/lateral leans Pt Will Perform Lower Body Dressing: with min guard assist;sit to/from stand Pt Will Transfer to Toilet: with supervision;ambulating Pt Will Perform Toileting - Clothing Manipulation and hygiene: with supervision;sit to/from stand Additional ADL Goal #1: Pt will perform bed mobility at mod I level prior to engaging in ADL  Plan Discharge plan remains appropriate;Frequency remains appropriate    Co-evaluation                 AM-PAC OT "6 Clicks" Daily Activity     Outcome Measure   Help from another person eating meals?: None Help from another person taking care of personal grooming?: A Little Help from another person toileting, which includes using toliet, bedpan, or urinal?: A Lot Help from another person bathing (including washing, rinsing, drying)?: A Lot Help from another person to put on and taking off regular upper body clothing?: A Little Help from another person to put on and taking off regular lower body clothing?: A Little 6 Click Score: 17    End of Session Equipment Utilized During Treatment: Gait belt;Rolling walker  OT Visit Diagnosis: Unsteadiness on feet (R26.81);Other abnormalities of gait and mobility (R26.89);Pain;History of falling (Z91.81);Muscle weakness (generalized) (M62.81) Pain - Right/Left: Left Pain - part of body: Leg   Activity Tolerance Patient limited by pain;Patient tolerated treatment well   Patient Left in bed;with bed alarm set;with call bell/phone within reach   Nurse Communication  Mobility status        Time: 1530-1600 OT Time Calculation (min): 30 min  Charges: OT General Charges $OT Visit: 1 Visit OT Treatments $Self Care/Home Management : 8-22 mins $Neuromuscular Re-education: 8-22 mins  Gabriella [name]) Gabriella [name] OTR/L Acute Rehabilitation Services Pager: [phone] Office: [phone]    Fredda Hammed 11/10/2018, 4:27 PM

## 2018-11-10 NOTE — Progress Notes (Signed)
Internal Medicine Attending:   I saw and examined the patient. I reviewed the resident's note and I agree with the resident's findings and plan as documented in the resident's note.  Patient feels well today but still complains of some pain in her left lower extremity.  Patient denies any other complaints at this time.  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  Patient is status post left popliteal and tibial embolectomy and fasciotomy of the left lower extremity.  Continue with wound care per vascular surgery.  Vascular surgery follow-up and recommendations appreciated.  Continue with pain control for now.  Patient was noted to have worsening anemia secondary to bleeding from her left lower extremity and her anticoagulation was held yesterday.  Patient is status post 2 units PRBC and her hemoglobin is up to 9.6 today.  We will resume her heparin drip and Coumadin today.  Etiology of her aortic thrombi remains uncertain at this time.  Work-up including a CTA chest/abdomen/pelvis and a 2D echo showed no etiology.  We will consult cardiology for possible loop recorder placement and follow-up antiphospholipid antibodies.  No further work-up at this time.  Patient will likely be stable for DC to SNF this weekend or early next week.

## 2018-11-10 NOTE — Progress Notes (Signed)
ANTICOAGULATION CONSULT NOTE   Pharmacy Consult for heparin + warfarin Indication: DVT  Allergies  Allergen Reactions  . Other Diarrhea, Nausea And Vomiting, Anxiety and Other (See Comments)    Unknown pain medication: caused sweating, nervousness, and "Some new pain med given at [facility], but I don't know what it's called." Patient states "it knocked me out and I got sweaty and they gave me benadryl."     Patient Measurements: Height: 5\' 3"  (160 cm) Weight: 202 lb 9.6 oz (91.9 kg) IBW/kg (Calculated) : 52.4 Heparin Dosing Weight: 73kg  Vital Signs: Temp: 97.6 F (36.4 C) (02/21 0446) Temp Source: Oral (02/21 0446) BP: 129/58 (02/21 0446) Pulse Rate: 67 (02/21 0446)  Labs: Recent Labs    11/07/18 0842  11/07/18 1814 11/08/18 0250 11/09/18 0255 11/09/18 2333 11/10/18 0549  HGB  --    < >  --  9.2* 7.9* 9.6* 9.6*  HCT  --    < >  --  28.9* 24.4* 28.8* 29.5*  PLT  --    < >  --  136* 142*  --  142*  LABPROT  --   --   --  13.7 20.7*  --  20.2*  INR  --   --   --  1.06 1.81  --  1.75  HEPARINUNFRC  --    < > 0.61 0.38 0.58  --   --   CREATININE 0.87  --   --   --  1.07*  --  0.91   < > = values in this interval not displayed.    Estimated Creatinine Clearance: 57.5 mL/min (by C-G formula based on SCr of 0.91 mg/dL).   Medical History: Past Medical History:  Diagnosis Date  . Arthritis   . COPD (chronic obstructive pulmonary disease) (HCC)   . Diabetes mellitus without complication (HCC)   . Hypertension     Medications:  Medications Prior to Admission  Medication Sig Dispense Refill Last Dose  . acetaminophen (TYLENOL) 650 MG CR tablet Take 650 mg by mouth See admin instructions. Take 650 mg by mouth two times a day and an additional 650 mg at midday as needed for breakthrough pain   11/01/2018 at pm  . albuterol (PROVENTIL HFA) 108 (90 Base) MCG/ACT inhaler Inhale 2 puffs into the lungs every 6 (six) hours as needed for wheezing or shortness of  breath.    unk at [employer]  . amLODipine (NORVASC) 10 MG tablet Take 10 mg by mouth daily.   11/01/2018 at am  . Artificial Saliva (BIOTENE DRY MOUTH MOISTURIZING) SOLN 1 spray by Mouth Rinse route See admin instructions. Spray in the mouth 6 times a day   unk at unk  . chlorhexidine (PERIDEX) 0.12 % solution 5 mLs See admin instructions. Brush 1 teaspoonful of solution onto teeth and gums after PM mouth care/spit out excess and do not rinse   11/01/2018 at Unknown time  . Cholecalciferol (VITAMIN D3) 1.25 MG (50000 UT) CAPS Take 50,000 Units by mouth every Sunday.   10/22/2018 at Unknown time  . Dextran 70-Hypromellose (ARTIFICIAL TEARS PF OP) Place 1 drop into both eyes 2 (two) times daily.   unk at Unknown time  . diphenhydramine-acetaminophen (TYLENOL PM) 25-500 MG TABS tablet Take 2 tablets by mouth at bedtime.    11/01/2018 at pm  . DULoxetine (CYMBALTA) 60 MG capsule Take 60 mg by mouth daily.   11/01/2018 at am  . Fluticasone-Salmeterol (ADVAIR DISKUS) 250-50 MCG/DOSE AEPB Inhale 1 puff  into the lungs 2 (two) times daily.   unk at unk  . gabapentin (NEURONTIN) 600 MG tablet Take 600 mg by mouth 2 (two) times daily.    11/01/2018 at pm  . [EXPIRED] insulin aspart (NOVOLOG FLEXPEN) 100 UNIT/ML FlexPen Inject 10 Units into the skin once as needed (for a BGL of 504).    unk at [employer]  . Insulin Glargine (BASAGLAR KWIKPEN) 100 UNIT/ML SOPN Inject 50 Units into the skin at bedtime.    11/01/2018 at pm  . ipratropium-albuterol (DUONEB) 0.5-2.5 (3) MG/3ML SOLN Take 3 mLs by nebulization every 4 (four) hours as needed. (Patient taking differently: Take 3 mLs by nebulization every 4 (four) hours as needed (for wheezing or shortness of breath). ) 3 mL 0 unk at unk  . lisinopril (PRINIVIL,ZESTRIL) 30 MG tablet Take 30 mg by mouth daily.   11/01/2018 at Unknown time  . Melatonin 10 MG CAPS Take 10 mg by mouth at bedtime.   11/01/2018 at pm  . Menthol, Topical Analgesic, (BIOFREEZE EX) Apply 1 application topically See admin  instructions. Apply to affected areas 2 times a day for pain   unk at unk  . metFORMIN (GLUCOPHAGE) 1000 MG tablet Take 1,000 mg by mouth 2 (two) times daily with a meal.   11/01/2018 at Unknown time  . miconazole (MICRO GUARD) 2 % powder Apply 1 application topically See admin instructions. Apply to skin folds and perineum 2 times a day   unk at unk  . mineral oil-hydrophilic petrolatum (AQUAPHOR) ointment Apply 1 application topically See admin instructions. Apply to dry skin daily and after bathing   unk at unk  . rOPINIRole (REQUIP) 2 MG tablet Take 2 mg by mouth at bedtime.   10/31/2018 at pm  . rosuvastatin (CRESTOR) 20 MG tablet Take 20 mg by mouth at bedtime.   11/01/2018 at pm  . sennosides-docusate sodium (SENOKOT-S) 8.6-50 MG tablet Take 2 tablets by mouth at bedtime.   unk at [employer]  . traZODone (DESYREL) 50 MG tablet Take 50 mg by mouth at bedtime.   11/01/2018 at pm  . Turmeric 500 MG CAPS Take 500 mg by mouth 2 (two) times daily.   11/01/2018 at Unknown time   Scheduled:  . acetaminophen  650 mg Oral Q6H   Or  . acetaminophen  650 mg Rectal Q6H  . docusate sodium  100 mg Oral Daily  . DULoxetine  60 mg Oral Daily  . feeding supplement (GLUCERNA SHAKE)  237 mL Oral BID BM  . gabapentin  600 mg Oral BID  . insulin aspart  0-15 Units Subcutaneous TID WC  . insulin aspart  0-5 Units Subcutaneous QHS  . insulin aspart  4 Units Subcutaneous TID WC  . insulin glargine  28 Units Subcutaneous QHS  . nystatin   Topical BID  . pantoprazole  40 mg Oral Daily  . ramelteon  8 mg Oral QHS  . rosuvastatin  20 mg Oral QHS   Infusions:  . sodium chloride    . lactated ringers Stopped (11/07/18 2221)    Assessment: [age] yoF admitted with abd pain and found to have embolism on CT. Pharmacy consulted for anticoagulation, pt now s/p urgent embolectomy and fasciotomy on 2/16. Pt remains on heparin, warfarin load started 2/18.   Lateral fasciotomy with bleeding 2/20, post transfusion hgb now up to  9.6.  Bleeding noted to be improved today, orders to resume warfarin and heparin.    Goal of Therapy:  INR goal 2-3  Heparin level 0.3-0.7 units/ml Monitor platelets by anticoagulation protocol: Yes   Plan:  Restart heparin at 1400 units/hr Warfarin 5mg  tonight Check heparin level this evening to confirm based on previous rate  Sheppard Coil PharmD., BCPS Clinical Pharmacist 11/10/2018 8:33 AM

## 2018-11-10 NOTE — Progress Notes (Addendum)
[facility]  Subjective  - Doing OK over all    Objective (!) 129/58 67 97.6 F (36.4 C) (Oral) 17 92%  Intake/Output Summary (Last 24 hours) at 11/10/2018 0744 Last data filed at 11/09/2018 2228 Gross per 24 hour  Intake 315 ml  Output 750 ml  Net -435 ml   Palpable AT pulse left  Left medial incision healing well, lateral wound vac in place with 5 cc drainage Heart RRR Lungs non labored breathing   Assessment/Planning: POD # 5 Left popliteal and tibial embolectomy, 4 compartment fasciotomy, vein patch angioplasty left popliteal artery  HGB 9.6 post transfusion 2 units PRBC for post op anemia blood loss. Encourage mobility Will discuss discharge planning will order home wound vac and HH   Mosetta Pigeon 11/10/2018 7:44 AM -- [place] to resume heparin and warfarin today.  Does not seem to be bleeding from fasciotomy at this point.  Will need home [facility]  Fabienne Bruns, MD [facility] Office: [phone] Pager: [phone]  Laboratory Lab Results: Recent Labs    11/09/18 0255 11/09/18 2333 11/10/18 0549  WBC 10.1  --  8.6  HGB 7.9* 9.6* 9.6*  HCT 24.4* 28.8* 29.5*  PLT 142*  --  142*   BMET Recent Labs    11/09/18 0255 11/10/18 0549  NA 136 136  K 4.2 4.2  CL 99 100  CO2 30 30  GLUCOSE 117* 140*  BUN 14 14  CREATININE 1.07* 0.91  CALCIUM 8.1* 8.0*    COAG Lab Results  Component Value Date   INR 1.75 11/10/2018   INR 1.81 11/09/2018   INR 1.06 11/08/2018   No results found for: PTT

## 2018-11-10 NOTE — Progress Notes (Addendum)
   Subjective:  Gabriella [name] was doing well today, no acute events overnight. She reports that she is still having some LE leg pain but that it's getting better. Denies any new issues. All questions were answered.   Objective:  Vital signs in last 24 hours: Vitals:   11/09/18 1925 11/09/18 2039 11/10/18 0002 11/10/18 0446  BP: 137/62  110/73 (!) 129/58  Pulse: 80 73 66 67  Resp: 18  17 17   Temp: 98.2 F (36.8 C) 98.1 F (36.7 C) 98.6 F (37 C) 97.6 F (36.4 C)  TempSrc: Oral Oral Oral Oral  SpO2: 96% 95% 94% 92%  Weight:      Height:        General: Frail appearing, NAD, sitting in chair Cardiac: RRR, soft systolic murmur Pulmonary: Lungs CTABL, no wheezing or rhonchi Abdomen: Soft, minimal TTP, non-distended, +BS Extremity: Left lower extremity: wound vac in place on lateral side, staples in place on medial side, good pulses and warm extremities.   Assessment/Plan:  Principal Problem:   Gastroenteritis Active Problems:   AKI (acute kidney injury) (HCC)   Diarrhea   Hyperglycemia   Peripheral vascular disease (HCC)   This is a 78 year old female with a history of DM, COPD, HTN, and arthritis who presented with worsening diarrhea, abdominal pain, and generalized weakness. Found to be tachycardic, tachypneic, with soft blood pressures. Labs are significant for metabolic acidosis, creatinine 2.2, baseline 0.8, elevated lactic acid of 5.2, and leukocytosis of 26. Found to have intermittent leg pains, decreased pulses and cool extremities. LE u/s showed no DVT but artery occlusions. CTA showed embolus to LLE and she was taken for emergent embolectomy and fasciotomy.   Abdominal pain 2/2 gastroenteritis: Resolved, remains afebrile with no leukocytosis.   -Continue zofran and fentanyl PRN for pain control -Carb modified diet  -Continue maintenance fluids  Left LE emboli in popliteal and tibial artery  S/p left popliteal and tibial embolectomy, 4 compartment fasciotomy, and  vein patch angioplasty of the left popliteal artery on 2/16. She had some increased bleeding 2 days ago and heparin and coumadin were held, bleeding has improved and wound vac and staples in place. She is still having some leg pain and requiring multiple doses of pain medications. Still no explanation as to the cause of her arterial emboli, CTA chest and TTE showed no source. She will likely require a loop recorder to evaluate for atrial fibrillation. Antiphospholipid panel is still pending.  -Continuepercocet PRN -Continue fentanyl PRN -Restart heparin and coumadin per pharmacy, d/c heparin when INR is therapeutic -Vascular surgery following, appreciate recommendations -Consult cardiology for possible loop recorder placement -PT/OT > recommends SNF, spoke with [facility] physician who reports that they can provide PT/OT  -Patient agreeable to SNF placement -CSW assisting with SNF placement  Normocytic anemia: -Hgb stable today, s/p 2 units pRBC. Heparin and coumadin were held yesterday.  -Restart coumadin, monitor for signs of bleeding -Daily CBC -Transfuse if Hgb <7  Uncontrolled Diabetes: She is on insulin and metformin at home. A1c here was 11.5. CBGs have remained in a good range.  -Continuelantus 28 units daily -Continue novolog 4 units TID WC -SSI  AKI: Resolved with IV fluids.  -Follow BMP  Candidal intertrigo: -Continue nystatin powder  COPD:  -Continue duonebs PRN  FEN: No fluids, replete lytes prn, carb mod VTE ppx: lovenox Code Status: FULL  Dispo: Anticipated discharge in approximately 1-2 day(s).   Claudean Severance, MD 11/10/2018, 6:40 AM Pager: [phone]

## 2018-11-10 NOTE — Progress Notes (Signed)
Patient with with [facility] and needs their approval before patient can discharge to SNF. [facility] was closed today and CSW was unable to confirm placement approval.  Antony Blackbird, [facility] Clinical Social Worker [phone]

## 2018-11-10 NOTE — Progress Notes (Signed)
ANTICOAGULATION CONSULT NOTE   Pharmacy Consult for heparin + warfarin Indication: DVT  Allergies  Allergen Reactions  . Other Diarrhea, Nausea And Vomiting, Anxiety and Other (See Comments)    Unknown pain medication: caused sweating, nervousness, and "Some new pain med given at [facility], but I don't know what it's called." Patient states "it knocked me out and I got sweaty and they gave me benadryl."     Patient Measurements: Height: 5\' 3"  (160 cm) Weight: 202 lb 9.6 oz (91.9 kg) IBW/kg (Calculated) : 52.4 Heparin Dosing Weight: 73kg  Vital Signs: Temp: 98.5 F (36.9 C) (02/21 1950) Temp Source: Axillary (02/21 1950) BP: 143/69 (02/21 1950) Pulse Rate: 72 (02/21 1950)  Labs: Recent Labs    11/08/18 0250 11/09/18 0255 11/09/18 2333 11/10/18 0549 11/10/18 2031  HGB 9.2* 7.9* 9.6* 9.6*  --   HCT 28.9* 24.4* 28.8* 29.5*  --   PLT 136* 142*  --  142*  --   LABPROT 13.7 20.7*  --  20.2*  --   INR 1.06 1.81  --  1.75  --   HEPARINUNFRC 0.38 0.58  --   --  <0.10*  CREATININE  --  1.07*  --  0.91  --     Estimated Creatinine Clearance: 57.5 mL/min (by C-G formula based on SCr of 0.91 mg/dL).   Medical History: Past Medical History:  Diagnosis Date  . Arthritis   . COPD (chronic obstructive pulmonary disease) (HCC)   . Diabetes mellitus without complication (HCC)   . Hypertension     Medications:  Medications Prior to Admission  Medication Sig Dispense Refill Last Dose  . acetaminophen (TYLENOL) 650 MG CR tablet Take 650 mg by mouth See admin instructions. Take 650 mg by mouth two times a day and an additional 650 mg at midday as needed for breakthrough pain   11/01/2018 at pm  . albuterol (PROVENTIL HFA) 108 (90 Base) MCG/ACT inhaler Inhale 2 puffs into the lungs every 6 (six) hours as needed for wheezing or shortness of breath.    unk at [employer] [employer]  . amLODipine (NORVASC) 10 MG tablet Take 10 mg by mouth daily.   11/01/2018 at am  . Artificial Saliva (BIOTENE  DRY MOUTH MOISTURIZING) SOLN 1 spray by Mouth Rinse route See admin instructions. Spray in the mouth 6 times a day   unk at unk  . chlorhexidine (PERIDEX) 0.12 % solution 5 mLs See admin instructions. Brush 1 teaspoonful of solution onto teeth and gums after PM mouth care/spit out excess and do not rinse   11/01/2018 at Unknown time  . Cholecalciferol (VITAMIN D3) 1.25 MG (50000 UT) CAPS Take 50,000 Units by mouth every Sunday.   10/22/2018 at Unknown time  . Dextran 70-Hypromellose (ARTIFICIAL TEARS PF OP) Place 1 drop into both eyes 2 (two) times daily.   unk at Unknown time  . diphenhydramine-acetaminophen (TYLENOL PM) 25-500 MG TABS tablet Take 2 tablets by mouth at bedtime.    11/01/2018 at pm  . DULoxetine (CYMBALTA) 60 MG capsule Take 60 mg by mouth daily.   11/01/2018 at am  . Fluticasone-Salmeterol (ADVAIR DISKUS) 250-50 MCG/DOSE AEPB Inhale 1 puff into the lungs 2 (two) times daily.   unk at unk  . gabapentin (NEURONTIN) 600 MG tablet Take 600 mg by mouth 2 (two) times daily.    11/01/2018 at pm  . [EXPIRED] insulin aspart (NOVOLOG FLEXPEN) 100 UNIT/ML FlexPen Inject 10 Units into the skin once as needed (for a BGL of 504).  unk at [employer] [employer]  . Insulin Glargine (BASAGLAR KWIKPEN) 100 UNIT/ML SOPN Inject 50 Units into the skin at bedtime.    11/01/2018 at pm  . ipratropium-albuterol (DUONEB) 0.5-2.5 (3) MG/3ML SOLN Take 3 mLs by nebulization every 4 (four) hours as needed. (Patient taking differently: Take 3 mLs by nebulization every 4 (four) hours as needed (for wheezing or shortness of breath). ) 3 mL 0 unk at unk  . lisinopril (PRINIVIL,ZESTRIL) 30 MG tablet Take 30 mg by mouth daily.   11/01/2018 at Unknown time  . Melatonin 10 MG CAPS Take 10 mg by mouth at bedtime.   11/01/2018 at pm  . Menthol, Topical Analgesic, (BIOFREEZE EX) Apply 1 application topically See admin instructions. Apply to affected areas 2 times a day for pain   unk at unk  . metFORMIN (GLUCOPHAGE) 1000 MG tablet Take 1,000 mg by  mouth 2 (two) times daily with a meal.   11/01/2018 at Unknown time  . miconazole (MICRO GUARD) 2 % powder Apply 1 application topically See admin instructions. Apply to skin folds and perineum 2 times a day   unk at unk  . mineral oil-hydrophilic petrolatum (AQUAPHOR) ointment Apply 1 application topically See admin instructions. Apply to dry skin daily and after bathing   unk at unk  . rOPINIRole (REQUIP) 2 MG tablet Take 2 mg by mouth at bedtime.   10/31/2018 at pm  . rosuvastatin (CRESTOR) 20 MG tablet Take 20 mg by mouth at bedtime.   11/01/2018 at pm  . sennosides-docusate sodium (SENOKOT-S) 8.6-50 MG tablet Take 2 tablets by mouth at bedtime.   unk at [employer] [employer]  . traZODone (DESYREL) 50 MG tablet Take 50 mg by mouth at bedtime.   11/01/2018 at pm  . Turmeric 500 MG CAPS Take 500 mg by mouth 2 (two) times daily.   11/01/2018 at Unknown time   Scheduled:  . acetaminophen  650 mg Oral Q6H   Or  . acetaminophen  650 mg Rectal Q6H  . docusate sodium  100 mg Oral Daily  . DULoxetine  60 mg Oral Daily  . feeding supplement (GLUCERNA SHAKE)  237 mL Oral BID BM  . gabapentin  600 mg Oral BID  . insulin aspart  0-15 Units Subcutaneous TID WC  . insulin aspart  0-5 Units Subcutaneous QHS  . insulin aspart  4 Units Subcutaneous TID WC  . insulin glargine  28 Units Subcutaneous QHS  . nystatin   Topical BID  . pantoprazole  40 mg Oral Daily  . polyethylene glycol  17 g Oral Daily  . ramelteon  8 mg Oral QHS  . rosuvastatin  20 mg Oral QHS  . Warfarin - Pharmacist Dosing Inpatient   Does not apply q1800   Infusions:  . sodium chloride    . heparin 1,400 Units/hr (11/10/18 1305)    Assessment: 22 yoF admitted with abd pain and found to have embolism on CT. Pharmacy consulted for anticoagulation, pt now s/p urgent embolectomy and fasciotomy on 2/16. Pt remains on heparin, warfarin load started 2/18.   Lateral fasciotomy with bleeding 2/20, post transfusion hgb now up to 9.6.  Bleeding noted to be  improved today, orders to resume warfarin and heparin. Initial HL on previous therapeutic rate of IV heparin is undetectable. No issues with infusion or bleeding per RN.    Goal of Therapy:  INR goal 2-3 Heparin level 0.3-0.7 units/ml Monitor platelets by anticoagulation protocol: Yes   Plan:  Increase heparin to 1600 units/hr. No  bolus  F/u 8 hr  Check heparin level this evening to confirm based on previous rate  Vinnie LevelBenjamin Ladaisha Portillo, PharmD., BCPS Clinical Pharmacist Clinical phone for 11/10/18 until 3:30pm: [phone] If after 3:30pm, please [facility] to [facility] for unit-specific pharmacist

## 2018-11-11 DIAGNOSIS — I743 Embolism and thrombosis of arteries of the lower extremities: Secondary | ICD-10-CM

## 2018-11-11 LAB — CBC
HEMATOCRIT: 30.8 % — AB (ref 36.0–46.0)
Hemoglobin: 9.8 g/dL — ABNORMAL LOW (ref 12.0–15.0)
MCH: 29.4 pg (ref 26.0–34.0)
MCHC: 31.8 g/dL (ref 30.0–36.0)
MCV: 92.5 fL (ref 80.0–100.0)
Platelets: 160 10*3/uL (ref 150–400)
RBC: 3.33 MIL/uL — ABNORMAL LOW (ref 3.87–5.11)
RDW: 13.9 % (ref 11.5–15.5)
WBC: 9.1 10*3/uL (ref 4.0–10.5)
nRBC: 0 % (ref 0.0–0.2)

## 2018-11-11 LAB — COMPREHENSIVE METABOLIC PANEL
ALK PHOS: 36 U/L — AB (ref 38–126)
ALT: 18 U/L (ref 0–44)
AST: 23 U/L (ref 15–41)
Albumin: 2.2 g/dL — ABNORMAL LOW (ref 3.5–5.0)
Anion gap: 7 (ref 5–15)
BUN: 14 mg/dL (ref 8–23)
CO2: 32 mmol/L (ref 22–32)
Calcium: 8.1 mg/dL — ABNORMAL LOW (ref 8.9–10.3)
Chloride: 100 mmol/L (ref 98–111)
Creatinine, Ser: 0.91 mg/dL (ref 0.44–1.00)
GFR calc Af Amer: >60 mL/min (ref >60)
GFR calc non Af Amer: >60 mL/min (ref >60)
Glucose, Bld: 224 mg/dL — ABNORMAL HIGH (ref 70–99)
Potassium: 4.4 mmol/L (ref 3.5–5.1)
SODIUM: 139 mmol/L (ref 135–145)
TOTAL PROTEIN: 5.5 g/dL — AB (ref 6.5–8.1)
Total Bilirubin: 0.4 mg/dL (ref 0.3–1.2)

## 2018-11-11 LAB — GLUCOSE, CAPILLARY
Glucose-Capillary: 197 mg/dL — ABNORMAL HIGH (ref 70–99)
Glucose-Capillary: 174 mg/dL — ABNORMAL HIGH (ref 70–99)
Glucose-Capillary: 204 mg/dL — ABNORMAL HIGH (ref 70–99)
Glucose-Capillary: 114 mg/dL — ABNORMAL HIGH (ref 70–99)

## 2018-11-11 LAB — PROTIME-INR
INR: 1.62
Prothrombin Time: 19.1 seconds — ABNORMAL HIGH (ref 11.4–15.2)

## 2018-11-11 LAB — HEPARIN LEVEL (UNFRACTIONATED)
Heparin Unfractionated: <0.1 IU/mL — ABNORMAL LOW (ref 0.30–0.70)
Heparin Unfractionated: 0.48 IU/mL (ref 0.30–0.70)

## 2018-11-11 LAB — ANTIPHOSPHOLIPID SYNDROME EVAL, BLD
Anticardiolipin IgG: 12 GPL U/mL (ref 0–14)
DRVVT: 46 s (ref 0.0–47.0)
PTT LA: 43.9 s (ref 0.0–51.9)
Phosphatydalserine, IgA: 5 APS IgA (ref 0–20)
Phosphatydalserine, IgG: 15 GPS IgG — ABNORMAL HIGH (ref 0–11)
Phosphatydalserine, IgM: 11 MPS IgM (ref 0–25)

## 2018-11-11 MED ORDER — WARFARIN SODIUM 5 MG PO TABS
5.0000 mg | ORAL_TABLET | Freq: Once | ORAL | Status: AC
  Administered 2018-11-11: 5 mg via ORAL
  Filled 2018-11-11: qty 1

## 2018-11-11 NOTE — Progress Notes (Signed)
   Sub3jective: Ms. Verdell was doing well today, no acute events overnight. She reports that she is still having some pain in her left leg, denies any other issues. She was requesting a stool softener today, did have a bowel movement this morning. All questions were answered.   Objective:  Vital signs in last 24 hours: Vitals:   11/10/18 1239 11/10/18 1950 11/10/18 2319 11/11/18 0519  BP: 132/61 (!) 143/69 (!) 129/53 (!) 149/67  Pulse: 71 72 77 76  Resp:  18 19 17   Temp: 98 F (36.7 C) 98.5 F (36.9 C) 98 F (36.7 C) 97.9 F (36.6 C)  TempSrc: Oral Axillary Oral Oral  SpO2: 95% 100% 93% 94%  Weight:      Height:        General: Frail appearing female, NAD, sitting comfortably in bed Cardiac: Soft systolic murmur, RRR Pulmonary: CTABL, no wheezing, rhonchi or rales Abdomen: Soft, non-tender, non-distended Extremity: Left leg: wound vac in place on lateral leg, staples in placed on medial side, no erythema or edema  Assessment/Plan:  Principal Problem:   Gastroenteritis Active Problems:   AKI (acute kidney injury) (HCC)   Diarrhea   Hyperglycemia   Peripheral vascular disease (HCC)  This is a 78 year old female with a history of DM, COPD, HTN, and arthritis who presented with worsening diarrhea, abdominal pain, and generalized weakness. Found to be tachycardic, tachypneic, with soft blood pressures. Labs are significant for metabolic acidosis, creatinine 2.2, baseline 0.8, elevated lactic acid of 5.2, and leukocytosis of 26.Found to have intermittent leg pains, decreased pulses and cool extremities. LE u/s showed no DVT but artery occlusions. CTA showed embolus to LLE and she was taken for emergent embolectomy and fasciotomy.  Abdominal pain 2/2 gastroenteritis: Resolved, no leukocytosis, remains afebrile.  -Continue zofran and fentanyl PRN for pain control -Carb modified diet  Left LE emboli in popliteal and tibial artery  S/p left popliteal and tibial embolectomy, 4  compartment fasciotomy, and vein patch angioplasty of the left popliteal artery on 2/16. She had some increased bleeding 2 days ago and heparin and coumadin were held, bleeding has improved and wound vac and staples in place.  -Still no explanation as to the cause of her arterial emboli, CTA chest and TTE showed no source. -Consulted cardiology for loop recorder placement -Antiphospholipid panel is pending.  -ContinuepercocetPRN -Continue fentanyl PRN -Continue heparin drip and warfarin, she is subtherapeutic now, can d/c heparin once therapeutic. Line was clotted overnight and she may not have been receiving her heparin, new line was placed.  -PT/OT> recommends SNF, spoke with [facility] physician who reports that they can provide PT/OT  -CSW assisting with SNF placement  Normocytic anemia: -Hemoglobin stable -Continue heparin and coumadin -Daily CBC -Transfuse if Hgb <7  Uncontrolled Diabetes: She is on insulin and metformin at home. A1c here was 11.5.CBGs have remained in a good range.  -Continuelantus 28 units daily -Continue novolog 4 units TID WC -SSI  Candidal intertrigo: -Continue nystatin powder  COPD:  -Continue duonebs PRN  FEN: No fluids, replete lytes prn, carb mod VTE ppx: lovenox Code Status: FULL  Dispo: Anticipated discharge is pending SNF placement.   Claudean Severance, MD 11/11/2018, 6:41 AM Pager: [phone]

## 2018-11-11 NOTE — Plan of Care (Signed)

## 2018-11-11 NOTE — Progress Notes (Signed)
Internal Medicine Attending  Date: 11/11/2018  Patient name: Gabriella [name] Medical record number: [id number] Date of birth: [birth year] Age: 78 y.o. Gender: female  I saw and evaluated the patient. I reviewed the resident's note by Dr. Gwyneth Revels and I agree with the resident's findings and plans as documented in her progress note.  Other than hard stools Gabriella [name] was without complaints when seen on rounds this morning.  Her left leg was without significant edema, erythema, or warmth.  We are continuing wound care, anticoagulation with a target INR, and subsequent skilled nursing facility placement.  She is also pending a loop recorder to assess for atrial fibrillation as the cause of the arterial emboli.

## 2018-11-11 NOTE — Progress Notes (Signed)
ANTICOAGULATION CONSULT NOTE   Pharmacy Consult for heparin + warfarin Indication: DVT  Allergies  Allergen Reactions  . Other Diarrhea, Nausea And Vomiting, Anxiety and Other (See Comments)    Unknown pain medication: caused sweating, nervousness, and "[facility], but I don't know what it's called." Patient states "it knocked me out and I got sweaty and they gave me benadryl."     Patient Measurements: Height: 5\' 3"  (160 cm) Weight: 202 lb 9.6 oz (91.9 kg) IBW/kg (Calculated) : 52.4 Heparin Dosing Weight: 73kg  Vital Signs: Temp: 98.1 F (36.7 C) (02/22 0754) Temp Source: Oral (02/22 0754) BP: 140/61 (02/22 0754) Pulse Rate: 75 (02/22 0754)  Labs: Recent Labs    11/09/18 0255 11/09/18 2333 11/10/18 0549 11/10/18 2031 11/11/18 0554  HGB 7.9* 9.6* 9.6*  --  9.8*  HCT 24.4* 28.8* 29.5*  --  30.8*  PLT 142*  --  142*  --  160  LABPROT 20.7*  --  20.2*  --  19.1*  INR 1.81  --  1.75  --  1.62  HEPARINUNFRC 0.58  --   --  <0.10* <0.10*  CREATININE 1.07*  --  0.91  --  0.91    Estimated Creatinine Clearance: 57.5 mL/min (by C-G formula based on SCr of 0.91 mg/dL).   Medical History: Past Medical History:  Diagnosis Date  . Arthritis   . COPD (chronic obstructive pulmonary disease) (HCC)   . Diabetes mellitus without complication (HCC)   . Hypertension     Medications:  Medications Prior to Admission  Medication Sig Dispense Refill Last Dose  . acetaminophen (TYLENOL) 650 MG CR tablet Take 650 mg by mouth See admin instructions. Take 650 mg by mouth two times a day and an additional 650 mg at midday as needed for breakthrough pain   11/01/2018 at pm  . albuterol (PROVENTIL HFA) 108 (90 Base) MCG/ACT inhaler Inhale 2 puffs into the lungs every 6 (six) hours as needed for wheezing or shortness of breath.    [facility]  . amLODipine (NORVASC) 10 MG tablet Take 10 mg by mouth daily.   11/01/2018 at am  . Artificial Saliva (BIOTENE DRY  MOUTH MOISTURIZING) SOLN 1 spray by Mouth Rinse route See admin instructions. Spray in the mouth 6 times a day   unk at unk  . chlorhexidine (PERIDEX) 0.12 % solution 5 mLs See admin instructions. Brush 1 teaspoonful of solution onto teeth and gums after PM mouth care/spit out excess and do not rinse   11/01/2018 at Unknown time  . Cholecalciferol (VITAMIN D3) 1.25 MG (50000 UT) CAPS Take 50,000 Units by mouth every Sunday.   10/22/2018 at Unknown time  . Dextran 70-Hypromellose (ARTIFICIAL TEARS PF OP) Place 1 drop into both eyes 2 (two) times daily.   unk at Unknown time  . diphenhydramine-acetaminophen (TYLENOL PM) 25-500 MG TABS tablet Take 2 tablets by mouth at bedtime.    11/01/2018 at pm  . DULoxetine (CYMBALTA) 60 MG capsule Take 60 mg by mouth daily.   11/01/2018 at am  . Fluticasone-Salmeterol (ADVAIR DISKUS) 250-50 MCG/DOSE AEPB Inhale 1 puff into the lungs 2 (two) times daily.   unk at unk  . gabapentin (NEURONTIN) 600 MG tablet Take 600 mg by mouth 2 (two) times daily.    11/01/2018 at pm  . [EXPIRED] insulin aspart (NOVOLOG FLEXPEN) 100 UNIT/ML FlexPen Inject 10 Units into the skin once as needed (for a BGL of 504).  unk at [employer] [employer]  . Insulin Glargine (BASAGLAR KWIKPEN) 100 UNIT/ML SOPN Inject 50 Units into the skin at bedtime.    11/01/2018 at pm  . ipratropium-albuterol (DUONEB) 0.5-2.5 (3) MG/3ML SOLN Take 3 mLs by nebulization every 4 (four) hours as needed. (Patient taking differently: Take 3 mLs by nebulization every 4 (four) hours as needed (for wheezing or shortness of breath). ) 3 mL 0 unk at unk  . lisinopril (PRINIVIL,ZESTRIL) 30 MG tablet Take 30 mg by mouth daily.   11/01/2018 at Unknown time  . Melatonin 10 MG CAPS Take 10 mg by mouth at bedtime.   11/01/2018 at pm  . Menthol, Topical Analgesic, (BIOFREEZE EX) Apply 1 application topically See admin instructions. Apply to affected areas 2 times a day for pain   unk at unk  . metFORMIN (GLUCOPHAGE) 1000 MG tablet Take 1,000 mg by mouth  2 (two) times daily with a meal.   11/01/2018 at Unknown time  . miconazole (MICRO GUARD) 2 % powder Apply 1 application topically See admin instructions. Apply to skin folds and perineum 2 times a day   unk at unk  . mineral oil-hydrophilic petrolatum (AQUAPHOR) ointment Apply 1 application topically See admin instructions. Apply to dry skin daily and after bathing   unk at unk  . rOPINIRole (REQUIP) 2 MG tablet Take 2 mg by mouth at bedtime.   10/31/2018 at pm  . rosuvastatin (CRESTOR) 20 MG tablet Take 20 mg by mouth at bedtime.   11/01/2018 at pm  . sennosides-docusate sodium (SENOKOT-S) 8.6-50 MG tablet Take 2 tablets by mouth at bedtime.   unk at [employer]  . traZODone (DESYREL) 50 MG tablet Take 50 mg by mouth at bedtime.   11/01/2018 at pm  . Turmeric 500 MG CAPS Take 500 mg by mouth 2 (two) times daily.   11/01/2018 at Unknown time   Scheduled:  . acetaminophen  650 mg Oral Q6H   Or  . acetaminophen  650 mg Rectal Q6H  . docusate sodium  100 mg Oral Daily  . DULoxetine  60 mg Oral Daily  . feeding supplement (GLUCERNA SHAKE)  237 mL Oral BID BM  . gabapentin  600 mg Oral BID  . insulin aspart  0-15 Units Subcutaneous TID WC  . insulin aspart  0-5 Units Subcutaneous QHS  . insulin aspart  4 Units Subcutaneous TID WC  . insulin glargine  28 Units Subcutaneous QHS  . nystatin   Topical BID  . pantoprazole  40 mg Oral Daily  . polyethylene glycol  17 g Oral Daily  . ramelteon  8 mg Oral QHS  . rosuvastatin  20 mg Oral QHS  . Warfarin - Pharmacist Dosing Inpatient   Does not apply q1800   Infusions:  . sodium chloride    . heparin 1,600 Units/hr (11/11/18 6060)    Assessment: 70 yoF admitted with abd pain and found to have embolism on CT. Pharmacy consulted for anticoagulation, pt now s/p urgent embolectomy and fasciotomy on 2/16. Pt remains on heparin, warfarin load started 2/18.   Lateral fasciotomy with bleeding 2/20, post transfusion hgb now up to 9.6.  Bleeding noted to be improved  today, orders to resume warfarin and heparin. Initial HL on previous therapeutic rate of IV heparin is undetectable.  Heparin level undetectable again this morning. RN states the line was clotted off this morning when she assessed and patient endorses that it was beeping all night on and off. RN removed that line and placed heparin  in another line between 8 and 9 this morning. INR 1.62 this morning.    Goal of Therapy:  INR goal 2-3 Heparin level 0.3-0.7 units/ml Monitor platelets by anticoagulation protocol: Yes   Plan:  Heparin 1600 units/hr Warfarin 5mg  x1 tonight Daily protime, heparin lvl, CBC  Fredonia Highland, PharmD, BCPS Clinical Pharmacist [phone] Please check [facility] for all [facility] Pharmacy numbers 11/11/2018

## 2018-11-11 NOTE — Progress Notes (Signed)
   VASCULAR SURGERY ASSESSMENT & PLAN:   6 Days Post-Op s/p: Status post left popliteal and tibial embolectomy with 4 compartment fasciotomy.  Her VAC has a good seal.  The plan is for discharge once her arrangements can be made for her VAC at home.  The patient is now back on Coumadin.  Also on heparin pending therapeutic Coumadin.  SUBJECTIVE:   No complaints this morning.  PHYSICAL EXAM:   Vitals:   11/10/18 2319 11/11/18 0519 11/11/18 0754 11/11/18 1134  BP: (!) 129/53 (!) 149/67 140/61 (!) 152/102  Pulse: 77 76 75   Resp: 19 17  16   Temp: 98 F (36.7 C) 97.9 F (36.6 C) 98.1 F (36.7 C) 98 F (36.7 C)  TempSrc: Oral Oral Oral Oral  SpO2: 93% 94% 95%   Weight:      Height:       Left foot is warm and well-perfused. The VAC has a good seal.  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 acetaminophen  650 mg Oral Q6H   Or  . acetaminophen  650 mg Rectal Q6H  . docusate sodium  100 mg Oral Daily  . DULoxetine  60 mg Oral Daily  . feeding supplement (GLUCERNA SHAKE)  237 mL Oral BID BM  . gabapentin  600 mg Oral BID  . insulin aspart  0-15 Units Subcutaneous TID WC  . insulin aspart  0-5 Units Subcutaneous QHS  . insulin aspart  4 Units Subcutaneous TID WC  . insulin glargine  28 Units Subcutaneous QHS  . nystatin   Topical BID  . pantoprazole  40 mg Oral Daily   . polyethylene glycol  17 g Oral Daily  . ramelteon  8 mg Oral QHS  . rosuvastatin  20 mg Oral QHS  . warfarin  5 mg Oral ONCE-1800  . Warfarin - Pharmacist Dosing Inpatient   Does not apply q1800    Waverly Ferrari Beeper: [phone] Office: [phone] 11/11/2018

## 2018-11-11 NOTE — Progress Notes (Signed)
ANTICOAGULATION CONSULT NOTE   Pharmacy Consult for heparin + warfarin Indication: DVT  Allergies  Allergen Reactions  . Other Diarrhea, Nausea And Vomiting, Anxiety and Other (See Comments)    Unknown pain medication: caused sweating, nervousness, and "Some new pain med given at [facility], but I don't know what it's called." Patient states "it knocked me out and I got sweaty and they gave me benadryl."     Patient Measurements: Height: 5\' 3"  (160 cm) Weight: 202 lb 9.6 oz (91.9 kg) IBW/kg (Calculated) : 52.4 Heparin Dosing Weight: 73kg  Vital Signs: Temp: 98.1 F (36.7 C) (02/22 0754) Temp Source: Oral (02/22 0754) BP: 140/61 (02/22 0754) Pulse Rate: 75 (02/22 0754)  Labs: Recent Labs    11/09/18 0255 11/09/18 2333 11/10/18 0549 11/10/18 2031 11/11/18 0554  HGB 7.9* 9.6* 9.6*  --  9.8*  HCT 24.4* 28.8* 29.5*  --  30.8*  PLT 142*  --  142*  --  160  LABPROT 20.7*  --  20.2*  --  19.1*  INR 1.81  --  1.75  --  1.62  HEPARINUNFRC 0.58  --   --  <0.10* <0.10*  CREATININE 1.07*  --  0.91  --  0.91    Estimated Creatinine Clearance: 57.5 mL/min (by C-G formula based on SCr of 0.91 mg/dL).   Medical History: Past Medical History:  Diagnosis Date  . Arthritis   . COPD (chronic obstructive pulmonary disease) (HCC)   . Diabetes mellitus without complication (HCC)   . Hypertension     Medications:  Medications Prior to Admission  Medication Sig Dispense Refill Last Dose  . acetaminophen (TYLENOL) 650 MG CR tablet Take 650 mg by mouth See admin instructions. Take 650 mg by mouth two times a day and an additional 650 mg at midday as needed for breakthrough pain   11/01/2018 at pm  . albuterol (PROVENTIL HFA) 108 (90 Base) MCG/ACT inhaler Inhale 2 puffs into the lungs every 6 (six) hours as needed for wheezing or shortness of breath.    [facility] at [employer]  . amLODipine (NORVASC) 10 MG tablet Take 10 mg by mouth daily.   11/01/2018 at am  . Artificial Saliva (BIOTENE DRY  MOUTH MOISTURIZING) SOLN 1 spray by Mouth Rinse route See admin instructions. Spray in the mouth 6 times a day   unk at unk  . chlorhexidine (PERIDEX) 0.12 % solution 5 mLs See admin instructions. Brush 1 teaspoonful of solution onto teeth and gums after PM mouth care/spit out excess and do not rinse   11/01/2018 at Unknown time  . Cholecalciferol (VITAMIN D3) 1.25 MG (50000 UT) CAPS Take 50,000 Units by mouth every Sunday.   10/22/2018 at Unknown time  . Dextran 70-Hypromellose (ARTIFICIAL TEARS PF OP) Place 1 drop into both eyes 2 (two) times daily.   unk at Unknown time  . diphenhydramine-acetaminophen (TYLENOL PM) 25-500 MG TABS tablet Take 2 tablets by mouth at bedtime.    11/01/2018 at pm  . DULoxetine (CYMBALTA) 60 MG capsule Take 60 mg by mouth daily.   11/01/2018 at am  . Fluticasone-Salmeterol (ADVAIR DISKUS) 250-50 MCG/DOSE AEPB Inhale 1 puff into the lungs 2 (two) times daily.   unk at unk  . gabapentin (NEURONTIN) 600 MG tablet Take 600 mg by mouth 2 (two) times daily.    11/01/2018 at pm  . [EXPIRED] insulin aspart (NOVOLOG FLEXPEN) 100 UNIT/ML FlexPen Inject 10 Units into the skin once as needed (for a BGL of 504).  unk at [employer]  . Insulin Glargine (BASAGLAR KWIKPEN) 100 UNIT/ML SOPN Inject 50 Units into the skin at bedtime.    11/01/2018 at pm  . ipratropium-albuterol (DUONEB) 0.5-2.5 (3) MG/3ML SOLN Take 3 mLs by nebulization every 4 (four) hours as needed. (Patient taking differently: Take 3 mLs by nebulization every 4 (four) hours as needed (for wheezing or shortness of breath). ) 3 mL 0 unk at unk  . lisinopril (PRINIVIL,ZESTRIL) 30 MG tablet Take 30 mg by mouth daily.   11/01/2018 at Unknown time  . Melatonin 10 MG CAPS Take 10 mg by mouth at bedtime.   11/01/2018 at pm  . Menthol, Topical Analgesic, (BIOFREEZE EX) Apply 1 application topically See admin instructions. Apply to affected areas 2 times a day for pain   unk at unk  . metFORMIN (GLUCOPHAGE) 1000 MG tablet Take 1,000 mg by mouth  2 (two) times daily with a meal.   11/01/2018 at Unknown time  . miconazole (MICRO GUARD) 2 % powder Apply 1 application topically See admin instructions. Apply to skin folds and perineum 2 times a day   unk at unk  . mineral oil-hydrophilic petrolatum (AQUAPHOR) ointment Apply 1 application topically See admin instructions. Apply to dry skin daily and after bathing   unk at unk  . rOPINIRole (REQUIP) 2 MG tablet Take 2 mg by mouth at bedtime.   10/31/2018 at pm  . rosuvastatin (CRESTOR) 20 MG tablet Take 20 mg by mouth at bedtime.   11/01/2018 at pm  . sennosides-docusate sodium (SENOKOT-S) 8.6-50 MG tablet Take 2 tablets by mouth at bedtime.   unk at [employer]  . traZODone (DESYREL) 50 MG tablet Take 50 mg by mouth at bedtime.   11/01/2018 at pm  . Turmeric 500 MG CAPS Take 500 mg by mouth 2 (two) times daily.   11/01/2018 at Unknown time   Scheduled:  . acetaminophen  650 mg Oral Q6H   Or  . acetaminophen  650 mg Rectal Q6H  . docusate sodium  100 mg Oral Daily  . DULoxetine  60 mg Oral Daily  . feeding supplement (GLUCERNA SHAKE)  237 mL Oral BID BM  . gabapentin  600 mg Oral BID  . insulin aspart  0-15 Units Subcutaneous TID WC  . insulin aspart  0-5 Units Subcutaneous QHS  . insulin aspart  4 Units Subcutaneous TID WC  . insulin glargine  28 Units Subcutaneous QHS  . nystatin   Topical BID  . pantoprazole  40 mg Oral Daily  . polyethylene glycol  17 g Oral Daily  . ramelteon  8 mg Oral QHS  . rosuvastatin  20 mg Oral QHS  . Warfarin - Pharmacist Dosing Inpatient   Does not apply q1800   Infusions:  . sodium chloride    . heparin 1,600 Units/hr (11/11/18 1191)    Assessment: 56 yoF admitted with abd pain and found to have embolism on CT. Pharmacy consulted for anticoagulation, pt now s/p urgent embolectomy and fasciotomy on 2/16. Pt remains on heparin, warfarin load started 2/18.   Lateral fasciotomy with bleeding 2/20, post transfusion hgb now up to 9.6.  Bleeding noted to be improved  today, orders to resume warfarin and heparin. Initial HL on previous therapeutic rate of IV heparin is undetectable.  Heparin level undetectable again this morning. RN states the line was clotted off this morning when she assessed and patient endorses that it was beeping all night on and off. RN removed that line and placed heparin  in another line between 8 and 9 this morning.    Goal of Therapy:  INR goal 2-3 Heparin level 0.3-0.7 units/ml Monitor platelets by anticoagulation protocol: Yes   Plan:  Continue heparin at 1600 units/hr. No bolus  Check anti-Xa level in 8 hours and daily while on heparin Continue to monitor H&H and platelets   Thank you for allowing [place] to participate in this patients care.   Signe Colt, PharmD Please utilize Amion (under [facility]) for appropriate number for your unit pharmacist. 11/11/2018 9:47 AM

## 2018-11-11 NOTE — Progress Notes (Signed)
IV Heparin in left AC, continually beeps occluded.  Allows reset for 10-15 minutes, then beeps again.  Attempted to flush and site is occluded.  Changed IV to the right AC at 16 ml/hr via pump.  D/c'd left iv site.

## 2018-11-11 NOTE — Progress Notes (Signed)
ANTICOAGULATION CONSULT NOTE   Pharmacy Consult for heparin + warfarin Indication: DVT  Allergies  Allergen Reactions  . Other Diarrhea, Nausea And Vomiting, Anxiety and Other (See Comments)    Unknown pain medication: caused sweating, nervousness, and "Some new pain med given at [facility], but I don't know what it's called." Patient states "it knocked me out and I got sweaty and they gave me benadryl."     Patient Measurements: Height: 5\' 3"  (160 cm) Weight: 202 lb 9.6 oz (91.9 kg) IBW/kg (Calculated) : 52.4 Heparin Dosing Weight: 73kg  Vital Signs: Temp: 98 F (36.7 C) (02/22 1602) Temp Source: Oral (02/22 1602) BP: 142/71 (02/22 1602) Pulse Rate: 75 (02/22 0754)  Labs: Recent Labs    11/09/18 0255 11/09/18 2333 11/10/18 0549 11/10/18 2031 11/11/18 0554 11/11/18 1615  HGB 7.9* 9.6* 9.6*  --  9.8*  --   HCT 24.4* 28.8* 29.5*  --  30.8*  --   PLT 142*  --  142*  --  160  --   LABPROT 20.7*  --  20.2*  --  19.1*  --   INR 1.81  --  1.75  --  1.62  --   HEPARINUNFRC 0.58  --   --  <0.10* <0.10* 0.48  CREATININE 1.07*  --  0.91  --  0.91  --     Estimated Creatinine Clearance: 57.5 mL/min (by C-G formula based on SCr of 0.91 mg/dL).   Medical History: Past Medical History:  Diagnosis Date  . Arthritis   . COPD (chronic obstructive pulmonary disease) (HCC)   . Diabetes mellitus without complication (HCC)   . Hypertension     Medications:  Medications Prior to Admission  Medication Sig Dispense Refill Last Dose  . acetaminophen (TYLENOL) 650 MG CR tablet Take 650 mg by mouth See admin instructions. Take 650 mg by mouth two times a day and an additional 650 mg at midday as needed for breakthrough pain   11/01/2018 at pm  . albuterol (PROVENTIL HFA) 108 (90 Base) MCG/ACT inhaler Inhale 2 puffs into the lungs every 6 (six) hours as needed for wheezing or shortness of breath.    unk at [employer]  . amLODipine (NORVASC) 10 MG tablet Take 10 mg by mouth daily.    11/01/2018 at am  . Artificial Saliva (BIOTENE DRY MOUTH MOISTURIZING) SOLN 1 spray by Mouth Rinse route See admin instructions. Spray in the mouth 6 times a day   unk at unk  . chlorhexidine (PERIDEX) 0.12 % solution 5 mLs See admin instructions. Brush 1 teaspoonful of solution onto teeth and gums after PM mouth care/spit out excess and do not rinse   11/01/2018 at Unknown time  . Cholecalciferol (VITAMIN D3) 1.25 MG (50000 UT) CAPS Take 50,000 Units by mouth every Sunday.   10/22/2018 at Unknown time  . Dextran 70-Hypromellose (ARTIFICIAL TEARS PF OP) Place 1 drop into both eyes 2 (two) times daily.   unk at Unknown time  . diphenhydramine-acetaminophen (TYLENOL PM) 25-500 MG TABS tablet Take 2 tablets by mouth at bedtime.    11/01/2018 at pm  . DULoxetine (CYMBALTA) 60 MG capsule Take 60 mg by mouth daily.   11/01/2018 at am  . Fluticasone-Salmeterol (ADVAIR DISKUS) 250-50 MCG/DOSE AEPB Inhale 1 puff into the lungs 2 (two) times daily.   unk at unk  . gabapentin (NEURONTIN) 600 MG tablet Take 600 mg by mouth 2 (two) times daily.    11/01/2018 at pm  . [EXPIRED] insulin aspart (  NOVOLOG FLEXPEN) 100 UNIT/ML FlexPen Inject 10 Units into the skin once as needed (for a BGL of 504).    unk at [employer]  . Insulin Glargine (BASAGLAR KWIKPEN) 100 UNIT/ML SOPN Inject 50 Units into the skin at bedtime.    11/01/2018 at pm  . ipratropium-albuterol (DUONEB) 0.5-2.5 (3) MG/3ML SOLN Take 3 mLs by nebulization every 4 (four) hours as needed. (Patient taking differently: Take 3 mLs by nebulization every 4 (four) hours as needed (for wheezing or shortness of breath). ) 3 mL 0 unk at unk  . lisinopril (PRINIVIL,ZESTRIL) 30 MG tablet Take 30 mg by mouth daily.   11/01/2018 at Unknown time  . Melatonin 10 MG CAPS Take 10 mg by mouth at bedtime.   11/01/2018 at pm  . Menthol, Topical Analgesic, (BIOFREEZE EX) Apply 1 application topically See admin instructions. Apply to affected areas 2 times a day for pain   unk at unk  . metFORMIN  (GLUCOPHAGE) 1000 MG tablet Take 1,000 mg by mouth 2 (two) times daily with a meal.   11/01/2018 at Unknown time  . miconazole (MICRO GUARD) 2 % powder Apply 1 application topically See admin instructions. Apply to skin folds and perineum 2 times a day   unk at unk  . mineral oil-hydrophilic petrolatum (AQUAPHOR) ointment Apply 1 application topically See admin instructions. Apply to dry skin daily and after bathing   unk at unk  . rOPINIRole (REQUIP) 2 MG tablet Take 2 mg by mouth at bedtime.   10/31/2018 at pm  . rosuvastatin (CRESTOR) 20 MG tablet Take 20 mg by mouth at bedtime.   11/01/2018 at pm  . sennosides-docusate sodium (SENOKOT-S) 8.6-50 MG tablet Take 2 tablets by mouth at bedtime.   unk at [employer]  . traZODone (DESYREL) 50 MG tablet Take 50 mg by mouth at bedtime.   11/01/2018 at pm  . Turmeric 500 MG CAPS Take 500 mg by mouth 2 (two) times daily.   11/01/2018 at Unknown time   Scheduled:  . acetaminophen  650 mg Oral Q6H   Or  . acetaminophen  650 mg Rectal Q6H  . docusate sodium  100 mg Oral Daily  . DULoxetine  60 mg Oral Daily  . feeding supplement (GLUCERNA SHAKE)  237 mL Oral BID BM  . gabapentin  600 mg Oral BID  . insulin aspart  0-15 Units Subcutaneous TID WC  . insulin aspart  0-5 Units Subcutaneous QHS  . insulin aspart  4 Units Subcutaneous TID WC  . insulin glargine  28 Units Subcutaneous QHS  . nystatin   Topical BID  . pantoprazole  40 mg Oral Daily  . polyethylene glycol  17 g Oral Daily  . ramelteon  8 mg Oral QHS  . rosuvastatin  20 mg Oral QHS  . warfarin  5 mg Oral ONCE-1800  . Warfarin - Pharmacist Dosing Inpatient   Does not apply q1800   Infusions:  . sodium chloride    . heparin 1,600 Units/hr (11/11/18 1610)    Assessment: 13 yoF admitted with abd pain and found to have embolism on CT. Pharmacy consulted for anticoagulation, pt now s/p urgent embolectomy and fasciotomy on 2/16. Pt remains on heparin, warfarin load started 2/18.   Lateral fasciotomy  with bleeding 2/20, post transfusion hgb now up to 9.6.  Bleeding noted to be improved today, orders to resume warfarin and heparin. Initial HL on previous therapeutic rate of IV heparin is undetectable.  Heparin level undetectable again this morning. RN  states the line was clotted off this morning when she assessed and patient endorses that it was beeping all night on and off. RN removed that line and placed heparin in another line between 8 and 9 this morning. INR 1.62 this morning.  PM heparin level therapeeutic  Goal of Therapy:  INR goal 2-3 Heparin level 0.3-0.7 units/ml Monitor platelets by anticoagulation protocol: Yes   Plan:  Heparin 1600 units/hr Warfarin 5mg  x1 tonight Daily protime, heparin lvl, CBC  Thank you Okey Regal, PharmD Please check AMION for all [facility] numbers 11/11/2018

## 2018-11-12 LAB — GLUCOSE, CAPILLARY
GLUCOSE-CAPILLARY: 105 mg/dL — AB (ref 70–99)
Glucose-Capillary: 180 mg/dL — ABNORMAL HIGH (ref 70–99)
Glucose-Capillary: 171 mg/dL — ABNORMAL HIGH (ref 70–99)
Glucose-Capillary: 134 mg/dL — ABNORMAL HIGH (ref 70–99)

## 2018-11-12 LAB — CBC
HCT: 29.2 % — ABNORMAL LOW (ref 36.0–46.0)
Hemoglobin: 9.6 g/dL — ABNORMAL LOW (ref 12.0–15.0)
MCH: 30.3 pg (ref 26.0–34.0)
MCHC: 32.9 g/dL (ref 30.0–36.0)
MCV: 92.1 fL (ref 80.0–100.0)
Platelets: 158 10*3/uL (ref 150–400)
RBC: 3.17 MIL/uL — ABNORMAL LOW (ref 3.87–5.11)
RDW: 13.6 % (ref 11.5–15.5)
WBC: 10.2 10*3/uL (ref 4.0–10.5)
nRBC: 0 % (ref 0.0–0.2)

## 2018-11-12 LAB — PROTIME-INR
INR: 1.95
Prothrombin Time: 22 seconds — ABNORMAL HIGH (ref 11.4–15.2)

## 2018-11-12 LAB — HEPARIN LEVEL (UNFRACTIONATED)
HEPARIN UNFRACTIONATED: 1.08 [IU]/mL — AB (ref 0.30–0.70)
Heparin Unfractionated: 1.05 IU/mL — ABNORMAL HIGH (ref 0.30–0.70)

## 2018-11-12 MED ORDER — WARFARIN SODIUM 2.5 MG PO TABS
2.5000 mg | ORAL_TABLET | Freq: Once | ORAL | Status: AC
  Administered 2018-11-12: 2.5 mg via ORAL
  Filled 2018-11-12: qty 1

## 2018-11-12 MED ORDER — INSULIN GLARGINE 100 UNIT/ML ~~LOC~~ SOLN
22.0000 [IU] | Freq: Every day | SUBCUTANEOUS | Status: DC
  Administered 2018-11-12 – 2018-11-13 (×2): 22 [IU] via SUBCUTANEOUS
  Filled 2018-11-12 (×3): qty 0.22

## 2018-11-12 MED ORDER — INSULIN ASPART 100 UNIT/ML ~~LOC~~ SOLN
6.0000 [IU] | Freq: Three times a day (TID) | SUBCUTANEOUS | Status: DC
  Administered 2018-11-12 – 2018-11-14 (×6): 6 [IU] via SUBCUTANEOUS

## 2018-11-12 MED ORDER — SENNA 8.6 MG PO TABS
2.0000 | ORAL_TABLET | Freq: Every day | ORAL | Status: DC
  Administered 2018-11-12 – 2018-11-13 (×2): 17.2 mg via ORAL
  Filled 2018-11-12 (×2): qty 2

## 2018-11-12 MED ORDER — HEPARIN (PORCINE) 25000 UT/250ML-% IV SOLN
1250.0000 [IU]/h | INTRAVENOUS | Status: AC
  Administered 2018-11-12: 1300 [IU]/h via INTRAVENOUS
  Administered 2018-11-13: 1200 [IU]/h via INTRAVENOUS
  Filled 2018-11-12 (×2): qty 250

## 2018-11-12 NOTE — Progress Notes (Addendum)
ANTICOAGULATION CONSULT NOTE   Pharmacy Consult for heparin + warfarin Indication: DVT  Allergies  Allergen Reactions  . Other Diarrhea, Nausea And Vomiting, Anxiety and Other (See Comments)    Unknown pain medication: caused sweating, nervousness, and "Some new pain med given at [facility], but I don't know what it's called." Patient states "it knocked me out and I got sweaty and they gave me benadryl."     Patient Measurements: Height: 5\' 3"  (160 cm) Weight: 202 lb 9.6 oz (91.9 kg) IBW/kg (Calculated) : 52.4 Heparin Dosing Weight: 73kg  Vital Signs: Temp: 97.7 F (36.5 C) (02/23 0729) Temp Source: Axillary (02/23 0729) BP: 149/70 (02/23 0729) Pulse Rate: 69 (02/23 0729)  Labs: Recent Labs    11/10/18 0549  11/11/18 0554 11/11/18 1615 11/12/18 0314  HGB 9.6*  --  9.8*  --  9.6*  HCT 29.5*  --  30.8*  --  29.2*  PLT 142*  --  160  --  158  LABPROT 20.2*  --  19.1*  --  22.0*  INR 1.75  --  1.62  --  1.95  HEPARINUNFRC  --    < > <0.10* 0.48 1.05*  CREATININE 0.91  --  0.91  --   --    < > = values in this interval not displayed.    Estimated Creatinine Clearance: 57.5 mL/min (by C-G formula based on SCr of 0.91 mg/dL).   Medical History: Past Medical History:  Diagnosis Date  . Arthritis   . COPD (chronic obstructive pulmonary disease) (HCC)   . Diabetes mellitus without complication (HCC)   . Hypertension     Medications:  Medications Prior to Admission  Medication Sig Dispense Refill Last Dose  . acetaminophen (TYLENOL) 650 MG CR tablet Take 650 mg by mouth See admin instructions. Take 650 mg by mouth two times a day and an additional 650 mg at midday as needed for breakthrough pain   11/01/2018 at pm  . albuterol (PROVENTIL HFA) 108 (90 Base) MCG/ACT inhaler Inhale 2 puffs into the lungs every 6 (six) hours as needed for wheezing or shortness of breath.    unk at [employer]  . amLODipine (NORVASC) 10 MG tablet Take 10 mg by mouth daily.   11/01/2018 at am   . Artificial Saliva (BIOTENE DRY MOUTH MOISTURIZING) SOLN 1 spray by Mouth Rinse route See admin instructions. Spray in the mouth 6 times a day   unk at unk  . chlorhexidine (PERIDEX) 0.12 % solution 5 mLs See admin instructions. Brush 1 teaspoonful of solution onto teeth and gums after PM mouth care/spit out excess and do not rinse   11/01/2018 at Unknown time  . Cholecalciferol (VITAMIN D3) 1.25 MG (50000 UT) CAPS Take 50,000 Units by mouth every Sunday.   10/22/2018 at Unknown time  . Dextran 70-Hypromellose (ARTIFICIAL TEARS PF OP) Place 1 drop into both eyes 2 (two) times daily.   unk at Unknown time  . diphenhydramine-acetaminophen (TYLENOL PM) 25-500 MG TABS tablet Take 2 tablets by mouth at bedtime.    11/01/2018 at pm  . DULoxetine (CYMBALTA) 60 MG capsule Take 60 mg by mouth daily.   11/01/2018 at am  . Fluticasone-Salmeterol (ADVAIR DISKUS) 250-50 MCG/DOSE AEPB Inhale 1 puff into the lungs 2 (two) times daily.   unk at unk  . gabapentin (NEURONTIN) 600 MG tablet Take 600 mg by mouth 2 (two) times daily.    11/01/2018 at pm  . [EXPIRED] insulin aspart (NOVOLOG FLEXPEN) 100 UNIT/ML  FlexPen Inject 10 Units into the skin once as needed (for a BGL of 504).    unk at [employer] [facility]  . Insulin Glargine (BASAGLAR KWIKPEN) 100 UNIT/ML SOPN Inject 50 Units into the skin at bedtime.    11/01/2018 at pm  . ipratropium-albuterol (DUONEB) 0.5-2.5 (3) MG/3ML SOLN Take 3 mLs by nebulization every 4 (four) hours as needed. (Patient taking differently: Take 3 mLs by nebulization every 4 (four) hours as needed (for wheezing or shortness of breath). ) 3 mL 0 unk at unk  . lisinopril (PRINIVIL,ZESTRIL) 30 MG tablet Take 30 mg by mouth daily.   11/01/2018 at Unknown time  . Melatonin 10 MG CAPS Take 10 mg by mouth at bedtime.   11/01/2018 at pm  . Menthol, Topical Analgesic, (BIOFREEZE EX) Apply 1 application topically See admin instructions. Apply to affected areas 2 times a day for pain   unk at unk  . metFORMIN (GLUCOPHAGE)  1000 MG tablet Take 1,000 mg by mouth 2 (two) times daily with a meal.   11/01/2018 at Unknown time  . miconazole (MICRO GUARD) 2 % powder Apply 1 application topically See admin instructions. Apply to skin folds and perineum 2 times a day   unk at unk  . mineral oil-hydrophilic petrolatum (AQUAPHOR) ointment Apply 1 application topically See admin instructions. Apply to dry skin daily and after bathing   unk at unk  . rOPINIRole (REQUIP) 2 MG tablet Take 2 mg by mouth at bedtime.   10/31/2018 at pm  . rosuvastatin (CRESTOR) 20 MG tablet Take 20 mg by mouth at bedtime.   11/01/2018 at pm  . sennosides-docusate sodium (SENOKOT-S) 8.6-50 MG tablet Take 2 tablets by mouth at bedtime.   unk at [employer]  . traZODone (DESYREL) 50 MG tablet Take 50 mg by mouth at bedtime.   11/01/2018 at pm  . Turmeric 500 MG CAPS Take 500 mg by mouth 2 (two) times daily.   11/01/2018 at Unknown time   Scheduled:  . acetaminophen  650 mg Oral Q6H   Or  . acetaminophen  650 mg Rectal Q6H  . docusate sodium  100 mg Oral Daily  . DULoxetine  60 mg Oral Daily  . feeding supplement (GLUCERNA SHAKE)  237 mL Oral BID BM  . gabapentin  600 mg Oral BID  . insulin aspart  0-15 Units Subcutaneous TID WC  . insulin aspart  0-5 Units Subcutaneous QHS  . insulin aspart  4 Units Subcutaneous TID WC  . insulin glargine  28 Units Subcutaneous QHS  . nystatin   Topical BID  . pantoprazole  40 mg Oral Daily  . polyethylene glycol  17 g Oral Daily  . ramelteon  8 mg Oral QHS  . rosuvastatin  20 mg Oral QHS  . Warfarin - Pharmacist Dosing Inpatient   Does not apply q1800   Infusions:  . sodium chloride    . heparin 1,500 Units/hr (11/12/18 0441)    Assessment: [age] yoF admitted with abd pain and found to have embolism on CT. Pharmacy consulted for anticoagulation, pt now s/p urgent embolectomy and fasciotomy on 2/16. Pt remains on heparin, warfarin load started 2/18.   Lateral fasciotomy with bleeding 2/20, post transfusion hgb now up  to 9.6.  Bleeding noted to be improved today, orders to resume warfarin and heparin. Initial HL on previous therapeutic rate of IV heparin is undetectable.  INR trending up to 1.95 today, H/H stable. Heparin reduced this morning. Repeat level remains high.  Goal of Therapy:  INR goal 2-3 Heparin level 0.3-0.7 units/ml Monitor platelets by anticoagulation protocol: Yes   Plan:  -Hold heparin x1hr then reduce rate to 1300 units/h -Warfarin 2.5mg  PO x1 tonight -Daily INR, heparin lvl, CBC  Fredonia Highland, PharmD, BCPS Clinical Pharmacist Please check AMION for all [facility] Pharmacy numbers 11/12/2018

## 2018-11-12 NOTE — Progress Notes (Signed)
ANTICOAGULATION CONSULT NOTE   Pharmacy Consult for heparin Indication: DVT  Allergies  Allergen Reactions  . Other Diarrhea, Nausea And Vomiting, Anxiety and Other (See Comments)    Unknown pain medication: caused sweating, nervousness, and "Some new pain med given at [facility], but I don't know what it's called." Patient states "it knocked me out and I got sweaty and they gave me benadryl."     Patient Measurements: Height: 5\' 3"  (160 cm) Weight: 202 lb 9.6 oz (91.9 kg) IBW/kg (Calculated) : 52.4 Heparin Dosing Weight: 73kg  Vital Signs: Temp: 96.9 F (36.1 C) (02/23 0421) Temp Source: Axillary (02/23 0421) BP: 124/58 (02/23 0421) Pulse Rate: 62 (02/23 0421)  Labs: Recent Labs    11/10/18 0549  11/11/18 0554 11/11/18 1615 11/12/18 0314  HGB 9.6*  --  9.8*  --  9.6*  HCT 29.5*  --  30.8*  --  29.2*  PLT 142*  --  160  --  158  LABPROT 20.2*  --  19.1*  --  22.0*  INR 1.75  --  1.62  --  1.95  HEPARINUNFRC  --    < > <0.10* 0.48 1.05*  CREATININE 0.91  --  0.91  --   --    < > = values in this interval not displayed.    Estimated Creatinine Clearance: 57.5 mL/min (by C-G formula based on SCr of 0.91 mg/dL).   Medical History: Past Medical History:  Diagnosis Date  . Arthritis   . COPD (chronic obstructive pulmonary disease) (HCC)   . Diabetes mellitus without complication (HCC)   . Hypertension      77 yoF admitted with abd pain and found to have embolism on CT. Pharmacy consulted for anticoagulation, pt now s/p urgent embolectomy and fasciotomy on 2/16. Pt remains on heparin, warfarin load started 2/18.   Lateral fasciotomy with bleeding 2/20, post transfusion hgb now up to 9.6.  Bleeding noted to be improved today, orders to resume warfarin and heparin. Initial HL on previous therapeutic rate of IV heparin is undetectable.  Heparin level undetectable again this morning. RN states the line was clotted off this morning when she assessed and patient  endorses that it was beeping all night on and off. RN removed that line and placed heparin in another line between 8 and 9 this morning. INR 1.62 this morning.  Am heparin level 1.05, INR 1.95  Goal of Therapy:  Heparin level 0.3-0.7 units/ml Monitor platelets by anticoagulation protocol: Yes   Plan:  Decrease heparin 1500 units/hr Check heparin level this afternoon Daily protime, heparin lvl, CBC  Thank you Talbert Cage, PharmD Please check AMION for all [facility] Pharmacy numbers 11/12/2018

## 2018-11-12 NOTE — Progress Notes (Signed)
   Subjective: Gabriella [name] is doing well this morning, still having some leg pain but it is stable she is eager to get the wound VAC off, she feels the suction is causing her some discomfort.  Denies any chest pain or shortness of breath  Objective:  Vital signs in last 24 hours: Vitals:   11/11/18 1602 11/11/18 1952 11/11/18 2309 11/12/18 0421  BP: (!) 142/71 132/63 (!) 145/69 (!) 124/58  Pulse:  63 70 62  Resp: 18 19 16 16   Temp: 98 F (36.7 C) (!) 97.5 F (36.4 C) 97.8 F (36.6 C) (!) 96.9 F (36.1 C)  TempSrc: Oral Oral Oral Axillary  SpO2:  95% 95% 92%  Weight:      Height:       Telemetry:  Personally reviewed: NSR  Physical Exam:  General: obese female, NAD, sitting comfortably in bed Cardiac: Soft systolic murmur, RRR Pulmonary: CTABL, no wheezing, rhonchi or rales Abdomen: Soft, non-tender, non-distended Extremity: Left leg: wound vac in place on lateral leg, staples in placed on medial side, no erythema or edema, normal distal pulses  Assessment/Plan:  Principal Problem:   Gastroenteritis Active Problems:   AKI (acute kidney injury) (HCC)   Diarrhea   Hyperglycemia   Peripheral vascular disease (HCC)   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 Found to be tachycardic, tachypneic, with soft blood pressures. Labs are significant for metabolic acidosis, creatinine 2.2, baseline 0.8, elevated lactic acid of 5.2, and leukocytosis of 26.Found to have intermittent leg pains, decreased pulses and cool extremities. LE u/s showed no DVT but artery occlusions. CTA showed embolus to LLE and she was taken for emergent embolectomy and fasciotomy.  Abdominal pain 2/2 gastroenteritis: Resolved, no leukocytosis, remains afebrile.  -Continue zofran and fentanyl PRN for pain control -Carb modified  diet  Left LE emboli in popliteal and tibial artery  S/p left popliteal and tibial embolectomy, 4 compartment fasciotomy, and vein patch angioplasty of the left popliteal artery on 2/16. She had some increased bleeding 2 days ago and heparin and coumadin were held, bleeding has improved and wound vac and staples in place. Still no explanation as to the cause of her arterial emboli, CTA chest and TTE showed no source. Consulted cardiology for loop recorder placement.  Antiphospholipid panel is negative  -ContinuepercocetPRN -Continue fentanyl PRN -Continue heparin drip and warfarin, bridge to warfarin -PT/OT> recommends SNF -CSW assisting with SNF placement  Normocytic anemia: Hemoglobin stable -Continue heparin and coumadin -Daily CBC -Transfuse if Hgb <7  Uncontrolled Diabetes: She is on insulin and metformin at home. A1c here was 11.5.CBGs have remained in a good range.  -decrease lantus 22 units daily -increase novolog 6 units TID WC -SSI  Candidal intertrigo: -Continue nystatin powder  COPD:  -Continue duonebs PRN  FEN: No fluids, replete lytes prn, carb mod VTE ppx: lovenox Code Status: FULL  Dispo: Anticipated discharge is pending SNF placement.   [name], MD 11/12/2018, 7:25 AM

## 2018-11-12 NOTE — Plan of Care (Signed)
Care plans reviewed and patient is progressing.  

## 2018-11-12 NOTE — Progress Notes (Addendum)
[facility] [place]  Subjective  - No new complaints   Objective (!) 149/70 69 97.7 F (36.5 C) (Axillary) 16 95%  Intake/Output Summary (Last 24 hours) at 11/12/2018 0813 Last data filed at 11/12/2018 0507 Gross per 24 hour  Intake 1673.96 ml  Output 2300 ml  Net -626.04 ml    Doppler signal DP with palpable AT Wound vac in place lateral incision 50 cc ouput, good seal.  Medial incision closed and healing well. Heart RRR Lungs non labored breathing  Assessment/Planning: POD # 7 Left popliteal and tibial embolectomy,4 compartment fasciotomy,vein patch angioplasty left popliteal artery  HGB stable at 9.6 surgical post op anemia after transfusion of 2 units PRBC. Mobility issues may need SNF consult, Encourage mobility. Patent arterial blood flow with palpable AT and doppler DP. Patient will be discharged with wound vac.  Plan to change vac tomorrow.   Mosetta Pigeon 11/12/2018 8:13 AM --  I have interviewed the patient and examined the patient. I agree with the findings by the PA.  Cari Caraway, MD [phone]   Laboratory Lab Results: Recent Labs    11/11/18 0554 11/12/18 0314  WBC 9.1 10.2  HGB 9.8* 9.6*  HCT 30.8* 29.2*  PLT 160 158   BMET Recent Labs    11/10/18 0549 11/11/18 0554  NA 136 139  K 4.2 4.4  CL 100 100  CO2 30 32  GLUCOSE 140* 224*  BUN 14 14  CREATININE 0.91 0.91  CALCIUM 8.0* 8.1*    COAG Lab Results  Component Value Date   INR 1.95 11/12/2018   INR 1.62 11/11/2018   INR 1.75 11/10/2018   No results found for: PTT

## 2018-11-13 ENCOUNTER — Other Ambulatory Visit: Payer: Self-pay | Admitting: Cardiology

## 2018-11-13 DIAGNOSIS — I82401 Acute embolism and thrombosis of unspecified deep veins of right lower extremity: Secondary | ICD-10-CM

## 2018-11-13 LAB — PROTIME-INR
INR: 1.85
PROTHROMBIN TIME: 21.1 s — AB (ref 11.4–15.2)

## 2018-11-13 LAB — GLUCOSE, CAPILLARY
Glucose-Capillary: 111 mg/dL — ABNORMAL HIGH (ref 70–99)
Glucose-Capillary: 140 mg/dL — ABNORMAL HIGH (ref 70–99)
Glucose-Capillary: 143 mg/dL — ABNORMAL HIGH (ref 70–99)
Glucose-Capillary: 208 mg/dL — ABNORMAL HIGH (ref 70–99)

## 2018-11-13 LAB — HEPARIN LEVEL (UNFRACTIONATED)
HEPARIN UNFRACTIONATED: 0.29 [IU]/mL — AB (ref 0.30–0.70)
Heparin Unfractionated: 0.62 IU/mL (ref 0.30–0.70)
Heparin Unfractionated: 0.91 IU/mL — ABNORMAL HIGH (ref 0.30–0.70)

## 2018-11-13 MED ORDER — WARFARIN SODIUM 5 MG PO TABS
5.0000 mg | ORAL_TABLET | Freq: Once | ORAL | Status: AC
  Administered 2018-11-13: 5 mg via ORAL
  Filled 2018-11-13: qty 1

## 2018-11-13 MED ORDER — OXYCODONE HCL 5 MG PO TABS
10.0000 mg | ORAL_TABLET | ORAL | Status: DC | PRN
  Administered 2018-11-13 – 2018-11-14 (×6): 10 mg via ORAL
  Filled 2018-11-13 (×6): qty 2

## 2018-11-13 NOTE — Progress Notes (Signed)
ANTICOAGULATION CONSULT NOTE   Pharmacy Consult for heparin + warfarin Indication: DVT  Allergies  Allergen Reactions  . Other Diarrhea, Nausea And Vomiting, Anxiety and Other (See Comments)    Unknown pain medication: caused sweating, nervousness, and "Some new pain med given at [facility], but I don't know what it's called." Patient states "it knocked me out and I got sweaty and they gave me benadryl."     Patient Measurements: Height: 5\' 3"  (160 cm) Weight: 202 lb 9.6 oz (91.9 kg) IBW/kg (Calculated) : 52.4 Heparin Dosing Weight: 73kg  Vital Signs: Temp: 98.3 F (36.8 C) (02/23 2320) Temp Source: Oral (02/23 2320) BP: 133/69 (02/23 2320) Pulse Rate: 72 (02/23 2320)  Labs: Recent Labs    11/10/18 0549  11/11/18 0554  11/12/18 0314 11/12/18 1329 11/13/18 0028  HGB 9.6*  --  9.8*  --  9.6*  --   --   HCT 29.5*  --  30.8*  --  29.2*  --   --   PLT 142*  --  160  --  158  --   --   LABPROT 20.2*  --  19.1*  --  22.0*  --  21.1*  INR 1.75  --  1.62  --  1.95  --  1.85  HEPARINUNFRC  --    < > <0.10*   < > 1.05* 1.08* 0.62  CREATININE 0.91  --  0.91  --   --   --   --    < > = values in this interval not displayed.    Estimated Creatinine Clearance: 57.5 mL/min (by C-G formula based on SCr of 0.91 mg/dL).   Medical History: Past Medical History:  Diagnosis Date  . Arthritis   . COPD (chronic obstructive pulmonary disease) (HCC)   . Diabetes mellitus without complication (HCC)   . Hypertension    Assessment: 33 yoF admitted with abd pain and found to have embolism on CT. Pharmacy consulted for anticoagulation, pt now s/p urgent embolectomy and fasciotomy on 2/16. Pt remains on heparin, warfarin load started 2/18.   Lateral fasciotomy with bleeding 2/20, post transfusion hgb now up to 9.6.  Bleeding noted to be improved today, orders to resume warfarin and heparin. Initial HL on previous therapeutic rate of IV heparin is undetectable.  Heparin level  therpaeutic tonight.   Goal of Therapy:  INR goal 2-3 Heparin level 0.3-0.7 units/ml Monitor platelets by anticoagulation protocol: Yes   Plan:  -Continue heparin 1300 units/h -Daily INR, heparin lvl, CBC Talbert Cage, PharmD Clinical Pharmacist  11/13/2018

## 2018-11-13 NOTE — Progress Notes (Signed)
ANTICOAGULATION CONSULT NOTE   Pharmacy Consult for heparin + warfarin Indication: DVT   Patient Measurements: Height: 5\' 3"  (160 cm) Weight: 202 lb 9.6 oz (91.9 kg) IBW/kg (Calculated) : 52.4 Heparin Dosing Weight: 73kg  Vital Signs: Temp: 98.2 F (36.8 C) (02/24 1725) Temp Source: Oral (02/24 1725) BP: 128/75 (02/24 1725) Pulse Rate: 75 (02/24 1725)  Labs: Recent Labs    11/11/18 0554  11/12/18 0314  11/13/18 0028 11/13/18 0636 11/13/18 1616  HGB 9.8*  --  9.6*  --   --   --   --   HCT 30.8*  --  29.2*  --   --   --   --   PLT 160  --  158  --   --   --   --   LABPROT 19.1*  --  22.0*  --  21.1*  --   --   INR 1.62  --  1.95  --  1.85  --   --   HEPARINUNFRC <0.10*   < > 1.05*   < > 0.62 0.91* 0.29*  CREATININE 0.91  --   --   --   --   --   --    < > = values in this interval not displayed.     Assessment: 78 year old female admitted with abd pain and found to have popliteal and tibial embolism on CT. Pharmacy consulted for anticoagulation, pt is s/p urgent embolectomy and fasciotomy on 2/16. Pt remains on heparin as bridge to warfarin.  -heparin level= 0.29 after decrease to 1200 units  Goal of Therapy:  INR goal 2-3 Heparin level 0.3-0.7 units/ml Monitor platelets by anticoagulation protocol: Yes     Plan:29 -Increase heparin to 1250 units/hr -Daily INR, heparin level, CBC  Harland German, PharmD Clinical Pharmacist **Pharmacist phone directory can now be found on [email or web address] ([email or web address]).  Listed under [facility].

## 2018-11-13 NOTE — Progress Notes (Signed)
Called [facility] Leighton Ruff  [phone],  left voice message regarding placement, requested to return call.  Antony Blackbird, [facility] Clinical Social Worker [phone]

## 2018-11-13 NOTE — Progress Notes (Signed)
Physical Therapy Treatment Patient Details Name: Gabriella [name] MRN: [id number] DOB: 11-Jan-1941 Today's Date: 11/13/2018    History of Present Illness Gabriella [name] is a 78 year old woman with PMH of HTN, DM2, COPD, arthritis who presented with a diarrhea illness, guaiac + stool and abdominal pain.  She was dizzy and weak at home, she was noted to have enteritis on CT scan.  She also reported severe left leg pain. She met sepsis criteria. [facility] is [facility] in the [facility] program. Pt s/p Left popliteal and tibial embolectomy, 4 compartment fasciotomy, vein patch angioplasty left popliteal artery 11/05/2018.    PT Comments    Pt making steady progress. Able to amb short distance in hallway.    Follow Up Recommendations  SNF     Equipment Recommendations  None recommended by PT    Recommendations for Other Services       Precautions / Restrictions Precautions Precautions: Fall Precaution Comments: VAC LLE Restrictions Weight Bearing Restrictions: No    Mobility  Bed Mobility Overal bed mobility: Needs Assistance Bed Mobility: Supine to Sit     Supine to sit: HOB elevated;Min guard     General bed mobility comments: Assist for lines and incr time  Transfers Overall transfer level: Needs assistance Equipment used: Rolling walker (2 wheeled) Transfers: Sit to/from Stand Sit to Stand: Min assist;+2 safety/equipment         General transfer comment: Assist to bring hips up and for balance  Ambulation/Gait Ambulation/Gait assistance: Min assist;+2 safety/equipment Gait Distance (Feet): 35 Feet Assistive device: 4-wheeled walker Gait Pattern/deviations: Step-through pattern;Decreased stride length;Ataxic Gait velocity: decreased Gait velocity interpretation: <1.31 ft/sec, indicative of household ambulator General Gait Details: Assist for balance and support   Stairs             Wheelchair Mobility    Modified Rankin (Stroke Patients Only)       Balance Overall  balance assessment: Needs assistance Sitting-balance support: Feet supported;No upper extremity supported Sitting balance-Leahy Scale: Good     Standing balance support: Bilateral upper extremity supported;During functional activity Standing balance-Leahy Scale: Poor Standing balance comment: rollator and min assist for static standing                            Cognition Arousal/Alertness: Awake/alert Behavior During Therapy: WFL for tasks assessed/performed Overall Cognitive Status: Within Functional Limits for tasks assessed                                        Exercises      General Comments        Pertinent Vitals/Pain Pain Assessment: Faces Faces Pain Scale: Hurts little more Pain Location: LLE Pain Descriptors / Indicators: Aching;Sore Pain Intervention(s): Limited activity within patient's tolerance;Monitored during session;Repositioned    Home Living                      Prior Function            PT Goals (current goals can now be found in the care plan section) Progress towards PT goals: Progressing toward goals    Frequency    Min 3X/week      PT Plan Current plan remains appropriate    Co-evaluation              AM-PAC PT "6 Clicks" Mobility  Outcome Measure  Help needed turning from your back to your side while in a flat bed without using bedrails?: None Help needed moving from lying on your back to sitting on the side of a flat bed without using bedrails?: A Little Help needed moving to and from a bed to a chair (including a wheelchair)?: A Little Help needed standing up from a chair using your arms (e.g., wheelchair or bedside chair)?: A Little Help needed to walk in hospital room?: A Little Help needed climbing 3-5 steps with a railing? : Total 6 Click Score: 17    End of Session Equipment Utilized During Treatment: Gait belt Activity Tolerance: Patient tolerated treatment well Patient left:  in chair;with call bell/phone within reach;with chair alarm set Nurse Communication: Mobility status PT Visit Diagnosis: Unsteadiness on feet (R26.81);Muscle weakness (generalized) (M62.81);Difficulty in walking, not elsewhere classified (R26.2);Pain Pain - Right/Left: Left Pain - part of body: Leg     Time: 2811-8867 PT Time Calculation (min) (ACUTE ONLY): 25 min  Charges:  $Gait Training: 23-37 mins                     [place] Pager [phone] Office [place] 11/13/2018, 4:25 PM

## 2018-11-13 NOTE — Progress Notes (Signed)
CSW called and spoke with SW Marylu Lund from [facility]. The patient has been denied at [facility] they are not able to accomodate her wound vac. CSW was advised to fax the patient out to [facility] and [place].   We are awaiting current bed offers. CSW will continue to follow.   Drucilla Schmidt, MSW, LCSW-A Clinical Social Worker [employer]

## 2018-11-13 NOTE — Progress Notes (Signed)
   Sub3jective: Gabriella [name] was doing okay today, no acute events overnight. She reports that she is still having some left lower leg pain, she reports that the pain pills don't help very much. She was asking about when she may be leaving and we discussed that we are trying to get her off of the heparin drip and find a rehab facility for her to go to. All questions were answered.     Objective:  Vital signs in last 24 hours: Vitals:   11/12/18 1700 11/12/18 2045 11/12/18 2320 11/13/18 0550  BP: (!) 148/62 137/66 133/69 (!) 153/77  Pulse:  70 72 70  Resp: 20 20  18   Temp: 98.5 F (36.9 C) 97.8 F (36.6 C) 98.3 F (36.8 C) 98 F (36.7 C)  TempSrc: Oral Oral Oral Oral  SpO2: 94% 94% 95% 95%  Weight:      Height:        General: Frail appearing female, NAD, resting comfortably Cardiac: Soft systolic murmur, RRR Pulmonary: CTABL, no wheezing, rhonchi or rales Abdomen: Soft, non-tender Extremity: Left leg: wound vac in place, tender to palpation, staples on medical side, warm extremities, sensation intact  Assessment/Plan:  Principal Problem:   Gastroenteritis Active Problems:   AKI (acute kidney injury) (HCC)   Diarrhea   Hyperglycemia   Peripheral vascular disease (HCC)   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 Found to be tachycardic, tachypneic, with soft blood pressures. Labs are significant for metabolic acidosis, creatinine 2.2, baseline 0.8, elevated lactic acid of 5.2, and leukocytosis of 26.Found to have intermittent leg pains, decreased pulses and cool extremities. LE u/s showed no DVT but artery occlusions. CTA showed embolus to LLE and she was taken for emergent embolectomy and fasciotomy.  Abdominal pain 2/2 gastroenteritis: Resolved, no leukocytosis, remains afebrile.  -Continue zofran and  fentanyl PRN for pain control -Carb modified diet  Left LE emboli in popliteal and tibial artery  S/p left popliteal and tibial embolectomy, 4 compartment fasciotomy, and vein patch angioplasty of the left popliteal artery on 2/16.  She is still having pain in her leg, however extremities remain warm, sensation intact, pulses intact. She had her wound vac changed today which may have contributed to the pain. She denied any new issues.  -Still no explanation as to the cause of her arterial emboli, CTA chest and TTE showed no source. -Cardiology consulted for loop recorder placement -Antiphospholipid panel is negative -Increase OxyIR to 10 mgPRN -Continue fentanyl PRN -Continue heparin drip and warfarin, transition to warfarin when INR is therapeutic  -PT/OT> recommends SNF, spoke with [facility] physician who reports that they can provide PT/OT  -CSW assisting with SNF placement  Normocytic anemia: -Denied any issues with bleeding.  -Continue heparin and coumadin -Daily CBC -Transfuse if Hgb <7  Uncontrolled Diabetes: She is on insulin and metformin at home. A1c here was 11.5.CBGs have remained in a good range.  -Continuelantus 28 units daily -Continue novolog 4 units TID WC -SSI  Candidal intertrigo: -Continue nystatin powder  COPD:  -Continue duonebs PRN  FEN: No fluids, replete lytes prn, carb mod VTE ppx: lovenox Code Status: FULL  Dispo: Anticipated discharge is pending SNF placement.   Claudean Severance, MD 11/13/2018, 6:43 AM Pager: [phone]

## 2018-11-13 NOTE — Progress Notes (Signed)
Internal Medicine Attending:   I saw and examined the patient. I reviewed the resident's note and I agree with the resident's findings and plan as documented in the resident's note.  Patient feels well today but has intermittent left lower extremity pain which is not well controlled.  Patient initially admitted to the hospital for acute gastroenteritis and had her hospital course complicated by left lower extremity aortic emboli in the popliteal and tibial arteries.  Patient is now status post embolectomy and fasciotomy in the left lower extremity.  Vascular surgery follow-up and recommendations appreciated.  Continue with wound VAC for now.  Patient stable for DC to SNF once her INR is therapeutic.  We will continue with IV heparin till INR is therapeutic.  Continue with pain control for now.  No further work-up at this time

## 2018-11-13 NOTE — Progress Notes (Addendum)
[facility] [place]  Subjective  - Doing OK over all, states she has had increased pain in the leg leg since yesterday.     Objective (!) 153/77 70 98 F (36.7 C) (Oral) 18 95%  Intake/Output Summary (Last 24 hours) at 11/13/2018 0747 Last data filed at 11/13/2018 0631 Gross per 24 hour  Intake 1128.44 ml  Output 2325 ml  Net -1196.56 ml    Doppler AT bisk, DP/Peroneal and PT signals Lateral wound vac changed at bedside base with some beefy red tissue, no evidence of necrotic tissue.  Wound vac replaced. Out put total 50 cc. Medial incision healing well   Assessment/Planning: POD # 8 Left popliteal and tibial embolectomy,4 compartment fasciotomy,vein patch angioplasty left popliteal artery  9.6 HGB stable post transfusions. Patent blood flow with doppler  Continue wound vac at discharge wound 8 x 3 x 0.5 deep cm.    Mosetta Pigeon 11/13/2018 7:47 AM -- Agree with above.  2+ AT pulse at ankle foot pink warm Medial incision clean and healing VAC over lateral incision  Needs to get up and walk PT/OT Awaiting SNF Ok to go from my standpoint when INR therapeutic Will need VAC at D/c  Fabienne Bruns, MD [facility] [place] Office: [phone] Pager: [phone]  Laboratory Lab Results: Recent Labs    11/11/18 0554 11/12/18 0314  WBC 9.1 10.2  HGB 9.8* 9.6*  HCT 30.8* 29.2*  PLT 160 158   BMET Recent Labs    11/11/18 0554  NA 139  K 4.4  CL 100  CO2 32  GLUCOSE 224*  BUN 14  CREATININE 0.91  CALCIUM 8.1*    COAG Lab Results  Component Value Date   INR 1.85 11/13/2018   INR 1.95 11/12/2018   INR 1.62 11/11/2018   No results found for: PTT

## 2018-11-13 NOTE — Progress Notes (Signed)
ANTICOAGULATION CONSULT NOTE   Pharmacy Consult for heparin + warfarin Indication: DVT   Patient Measurements: Height: 5\' 3"  (160 cm) Weight: 202 lb 9.6 oz (91.9 kg) IBW/kg (Calculated) : 52.4 Heparin Dosing Weight: 73kg  Vital Signs: Temp: 98.4 F (36.9 C) (02/24 0755) Temp Source: Oral (02/24 0755) BP: 136/112 (02/24 0755) Pulse Rate: 66 (02/24 0755)  Labs: Recent Labs    11/11/18 0554  11/12/18 0314 11/12/18 1329 11/13/18 0028 11/13/18 0636  HGB 9.8*  --  9.6*  --   --   --   HCT 30.8*  --  29.2*  --   --   --   PLT 160  --  158  --   --   --   LABPROT 19.1*  --  22.0*  --  21.1*  --   INR 1.62  --  1.95  --  1.85  --   HEPARINUNFRC <0.10*   < > 1.05* 1.08* 0.62 0.91*  CREATININE 0.91  --   --   --   --   --    < > = values in this interval not displayed.     Assessment: 78 year old female admitted with abd pain and found to have popliteal and tibial embolism on CT. Pharmacy consulted for anticoagulation, pt is s/p urgent embolectomy and fasciotomy on 2/16. Pt remains on heparin as bridge to warfarin. Current INR is 1.85. Heparin level is supra-therapeutic today. Discussed with RN > no bleeding issues.  Lateral fasciotomy with bleeding 2/20, post transfusion hgb now up to 9.6 > resolved.   Goal of Therapy:  INR goal 2-3 Heparin level 0.3-0.7 units/ml Monitor platelets by anticoagulation protocol: Yes     Plan:  -Reduce heparin to 1200 units/hr -Warfarin 5 mg po x1 -Daily INR, heparin level, CBC   [place] Friday 11/13/2018 8:35 AM

## 2018-11-14 DIAGNOSIS — Z7901 Long term (current) use of anticoagulants: Secondary | ICD-10-CM

## 2018-11-14 LAB — PROTIME-INR
INR: 1.5 — ABNORMAL HIGH (ref 0.8–1.2)
Prothrombin Time: 18.3 seconds — ABNORMAL HIGH (ref 11.4–15.2)

## 2018-11-14 LAB — GLUCOSE, CAPILLARY
GLUCOSE-CAPILLARY: 139 mg/dL — AB (ref 70–99)
Glucose-Capillary: 128 mg/dL — ABNORMAL HIGH (ref 70–99)

## 2018-11-14 LAB — HEPARIN LEVEL (UNFRACTIONATED): Heparin Unfractionated: 0.39 IU/mL (ref 0.30–0.70)

## 2018-11-14 MED ORDER — WARFARIN SODIUM 7.5 MG PO TABS: 7.5000 mg | ORAL_TABLET | Freq: Every day | ORAL | 0 refills | Status: DC

## 2018-11-14 MED ORDER — ENOXAPARIN SODIUM 100 MG/ML ~~LOC~~ SOLN: 90.0000 mg | Freq: Two times a day (BID) | SUBCUTANEOUS | 0 refills | Status: DC

## 2018-11-14 MED ORDER — INSULIN ASPART 100 UNIT/ML FLEXPEN: 10.0000 [IU] | PEN_INJECTOR | Freq: Once | SUBCUTANEOUS | 11 refills | Status: DC | PRN

## 2018-11-14 MED ORDER — OXYCODONE HCL 10 MG PO TABS: 10.0000 mg | ORAL_TABLET | ORAL | 0 refills | Status: DC | PRN

## 2018-11-14 MED ORDER — WARFARIN SODIUM 7.5 MG PO TABS: 7.5000 mg | ORAL_TABLET | Freq: Once | ORAL | Status: DC

## 2018-11-14 MED ORDER — ENOXAPARIN SODIUM 100 MG/ML ~~LOC~~ SOLN
90.0000 mg | Freq: Two times a day (BID) | SUBCUTANEOUS | Status: DC
  Administered 2018-11-14: 90 mg via SUBCUTANEOUS
  Filled 2018-11-14: qty 1

## 2018-11-14 NOTE — Progress Notes (Signed)
11/14/2018 4:24 PM Pt discharged with [facility] to [facility].  AVS given to transporter to give to [facility].   Kathryne Hitch

## 2018-11-14 NOTE — Progress Notes (Signed)
CSW spoke with Marylu Lund from [facility] and she stated that their preference would be [place]. She would be speaking with the patient, she stated that she left a voicemail for the patients daughter.   CSW called and spoke with the patients daughter. She stated that she would like her mother to go to [place]. CSW will speak with the patient about her preference.   CSW will continue to follow.   Drucilla Schmidt, MSW, LCSW-A Clinical Social Worker [employer]

## 2018-11-14 NOTE — Progress Notes (Signed)
11/14/2018 4:58 PM Attempted to call [facility] to give report.  No answer when transferred to nurse. Kathryne Hitch

## 2018-11-14 NOTE — Progress Notes (Signed)
11/14/2018 1600 Pt to discharge soon to [place].  Wound Vac dressing changed to a wet to dry dressing. Kathryne Hitch

## 2018-11-14 NOTE — Progress Notes (Signed)
   Subjective: Gabriella [name] was doing well today, no acute events overnight. She reports that she is still having some pain in her left leg. She denies any new complaints, denies any bleeding, nausea or vomiting. She has been having bowel movement. We discussed that we will be switching her blood thinners around and that we are still waiting for SNF placement. All questions were answered.  Objective:  Vital signs in last 24 hours: Vitals:   11/13/18 1725 11/13/18 1933 11/13/18 2337 11/14/18 0400  BP: 128/75 (!) 136/57 (!) 126/53 114/66  Pulse: 75 74 70 69  Resp: 18 17 18 16   Temp: 98.2 F (36.8 C) 97.9 F (36.6 C) 97.9 F (36.6 C) (!) 97.5 F (36.4 C)  TempSrc: Oral Oral Oral Oral  SpO2: 94% 95% 94% 94%  Weight:      Height:        General: Frail appearing female, NAD Cardiac: RRR, soft systolic murmur Pulmonary: CTABL, no wheezing or rhonchi Abdomen: Soft, minimal tenderness to palpation, no guarding Extremity:Left lateral shin wound vac in place, pulses intact, warm extremities  Assessment/Plan:  Principal Problem:   Gastroenteritis Active Problems:   AKI (acute kidney injury) (HCC)   Diarrhea   Hyperglycemia   Peripheral vascular disease (HCC)   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 Found to be tachycardic, tachypneic, with soft blood pressures. Labs are significant for metabolic acidosis, creatinine 2.2, baseline 0.8, elevated lactic acid of 5.2, and leukocytosis of 26.Found to have intermittent leg pains, decreased pulses and cool extremities. LE u/s showed no DVT but artery occlusions. CTA showed embolus to LLE and she was taken for emergent embolectomy and fasciotomy.   Left LE emboli in popliteal and tibial artery: S/p left popliteal and tibial embolectomy, 4 compartment fasciotomy, and vein  patch angioplasty of the left popliteal artery on 2/16.She is still having some pain in her leg and requiring pain medication to control it. No identifiable source at this time, CTA chest and TTE showed no source. On exam she has good pulses and warm extremities. Wound vac is in place.  -Cardiology consulted for holter monitor  -Continue OxyIR 10 mg PRN -Discontinue fentanyl   -Start lovenox and continue warfarin, will need to have therapeutic INR  -PT/OT recommends SNF -CSW assisting with SNF placement, working with [facility] -Stable to discharge to SNF when bed available  Gastroenteritis: -Resolved. Continue to monitor.   Normocytic anemia: -No new bleeding episodes, Hgb has been stable.  -Daily CBC -Transfuse if Hgb <7  Uncontrolled Diabetes: She is on insulin and metformin at home. A1c here was 11.5. -Continuelantus 28 units daily -Continuenovolog 4 units TID WC -SSI  FEN: No fluids, replete lytes prn, carb mod VTE ppx: Lovenox  Code Status: FULL   Dispo: Anticipated discharge is pending SNF placement  Claudean Severance, MD 11/14/2018, 6:36 AM Pager: [phone]

## 2018-11-14 NOTE — Progress Notes (Addendum)
  Progress Note    11/14/2018 7:47 AM 9 Days Post-Op  Subjective:  Reports a decent night's sleep with no appreciable change in pain in her leg since yesterday.  Afebrile HR in 70s-80s, NSR Systolic pressures 120s Oxygenation mid 90s on RA  Vitals:   11/13/18 2337 11/14/18 0400  BP: (!) 126/53 114/66  Pulse: 70 69  Resp: 18 16  Temp: 97.9 F (36.6 C) (!) 97.5 F (36.4 C)  SpO2: 94% 94%    Physical Exam: Cardiac:  NSR Lungs:  Breathing unlabored Incisions:  Lateral wound vac site c/d/i with good seal, changed yesterday (11/13/2018) Total output 100cc since surgery. Medial incision stapled, without redness or discharge, healing well Extremities:  Brisk AT, DP, and PT signal on doppler. Leg and foot pink and warm.  CBC    Component Value Date/Time   WBC 10.2 11/12/2018 0314   RBC 3.17 (L) 11/12/2018 0314   HGB 9.6 (L) 11/12/2018 0314   HCT 29.2 (L) 11/12/2018 0314   PLT 158 11/12/2018 0314   MCV 92.1 11/12/2018 0314   MCH 30.3 11/12/2018 0314   MCHC 32.9 11/12/2018 0314   RDW 13.6 11/12/2018 0314   LYMPHSABS 3.4 11/02/2018 1340   MONOABS 1.8 (H) 11/02/2018 1340   EOSABS 0.5 11/02/2018 1340   BASOSABS 0.1 11/02/2018 1340    BMET    Component Value Date/Time   NA 139 11/11/2018 0554   K 4.4 11/11/2018 0554   CL 100 11/11/2018 0554   CO2 32 11/11/2018 0554   GLUCOSE 224 (H) 11/11/2018 0554   BUN 14 11/11/2018 0554   CREATININE 0.91 11/11/2018 0554   CALCIUM 8.1 (L) 11/11/2018 0554   GFRNONAA >60 11/11/2018 0554   GFRAA >60 11/11/2018 0554    INR    Component Value Date/Time   INR 1.5 (H) 11/14/2018 0245     Intake/Output Summary (Last 24 hours) at 11/14/2018 0747 Last data filed at 11/14/2018 0025 Gross per 24 hour  Intake 720 ml  Output 1200 ml  Net -480 ml     Assessment:  78 y.o. female is s/p:  9 Days Post-Op # 8 Left popliteal and tibial embolectomy,4 compartment fasciotomy,vein patch angioplasty left popliteal artery   Plan: - Patent  flow with doppler - Continue wound vac at discharge - Awaiting SNF placement - DVT prophylaxis:  Pt on heparin, transitioning to lovenox today bridging to warfarin. INR not yet therapeutic at 1.5. - Follow up with [facility] appx 2-3 weeks post discharge.    Lemmie Evens, AGACNP-BC [facility] [phone] 11/14/2018  7:47 AM   2+ AT pulse at ankle Continue warfarin Awaiting SNF  Fabienne Bruns, MD [facility] [place] Office: [phone] Pager: [phone]

## 2018-11-14 NOTE — Progress Notes (Addendum)
ANTICOAGULATION CONSULT NOTE - Initial Consult  Pharmacy Consult for LMWH Indication: DVT  Allergies  Allergen Reactions  . Other Diarrhea, Nausea And Vomiting, Anxiety and Other (See Comments)    Unknown pain medication: caused sweating, nervousness, and "Some new pain med given at [facility], but I don't know what it's called." Patient states "it knocked me out and I got sweaty and they gave me benadryl."     Patient Measurements: Height: 5\' 3"  (160 cm) Weight: 202 lb 9.6 oz (91.9 kg) IBW/kg (Calculated) : 52.4  Vital Signs: Temp: 97.5 F (36.4 C) (02/25 0400) Temp Source: Oral (02/25 0400) BP: 114/66 (02/25 0400) Pulse Rate: 69 (02/25 0400)  Labs: Recent Labs    11/12/18 0314  11/13/18 0028 11/13/18 0636 11/13/18 1616 11/14/18 0245  HGB 9.6*  --   --   --   --   --   HCT 29.2*  --   --   --   --   --   PLT 158  --   --   --   --   --   LABPROT 22.0*  --  21.1*  --   --  18.3*  INR 1.95  --  1.85  --   --  1.5*  HEPARINUNFRC 1.05*   < > 0.62 0.91* 0.29* 0.39   < > = values in this interval not displayed.    Estimated Creatinine Clearance: 57.5 mL/min (by C-G formula based on SCr of 0.91 mg/dL).   Medical History: Past Medical History:  Diagnosis Date  . Arthritis   . COPD (chronic obstructive pulmonary disease) (HCC)   . Diabetes mellitus without complication (HCC)   . Hypertension     Assessment: 78yo female has been on UFH for DVT while awaiting therapeutic INR, now to switch to LMWH.  Goal of Therapy:  Anti-Xa level 0.6-1 units/ml 4hrs after LMWH dose given Monitor platelets by anticoagulation protocol: Yes   Plan:  Will d/c UFH at 0900 then start Lovenox 90mg  SQ Q12H at 1000 and monitor CBC.  Vernard Gambles, PharmD, BCPS  11/14/2018,7:36 AM   Addendum -Current INR 1.5 -Warfarin 7.5 mg po x1 -Daily INR  Baldemar Friday 11/14/2018 8:11 AM

## 2018-11-14 NOTE — Progress Notes (Addendum)
Occupational Therapy Treatment Patient Details Name: Gabriella [name] MRN: [id number] DOB: [birth year] Today's Date: 11/14/2018    History of present illness Gabriella [name] is a 78 year old woman with PMH of HTN, DM2, COPD, arthritis who presented with a diarrhea illness, guaiac + stool and abdominal pain.  She was dizzy and weak at home, she was noted to have enteritis on CT scan.  She also reported severe left leg pain. She met sepsis criteria. [facility]. Pt s/p Left popliteal and tibial embolectomy, 4 compartment fasciotomy, vein patch angioplasty left popliteal artery 11/05/2018.   OT comments  Pt progressing towards established OT goals. However, continues to present with poor balance, strength, and activity tolerance. Pt performing functional mobility with RW and Min Guard A for safety. Pt with increased fatigue at sink and required to sit to complete grooming. Continue to recommend dc to SNF and will continue to follow acutely as admitted.    Follow Up Recommendations  SNF;Supervision/Assistance - 24 hour    Equipment Recommendations  Other (comment)    Recommendations for Other Services      Precautions / Restrictions Precautions Precautions: Fall Precaution Comments: VAC LLE Restrictions Weight Bearing Restrictions: No       Mobility Bed Mobility               General bed mobility comments: In recliner upon arrival  Transfers Overall transfer level: Needs assistance Equipment used: Rolling walker (2 wheeled) Transfers: Sit to/from Stand Sit to Stand: Min guard         General transfer comment: Min Guard A for safety and cues for hand placement    Balance Overall balance assessment: Needs assistance Sitting-balance support: Feet supported;No upper extremity supported Sitting balance-Leahy Scale: Good     Standing balance support: Bilateral upper extremity supported;During functional activity Standing balance-Leahy Scale: Poor                              ADL either performed or assessed with clinical judgement   ADL Overall ADL's : Needs assistance/impaired     Grooming: Set up;Sitting;Oral care;Brushing hair Grooming Details (indicate cue type and reason): Pt performing functional mobility in hallway and then coming to sink for grooming. Due to fatigue, pt sitting on BSC at sink for grooming.                  Toilet Transfer: Min guard;Ambulation;BSC Toilet Transfer Details (indicate cue type and reason): Min Guard A for safety         Functional mobility during ADLs: Min guard;Rolling walker General ADL Comments: Pt performing functional mobility in hallway and then performed grooming at sink while seated. Pt continues to present with decreased balance, strength, and activity tolerance     Vision   Vision Assessment?: No apparent visual deficits   Perception     Praxis      Cognition Arousal/Alertness: Awake/alert Behavior During Therapy: WFL for tasks assessed/performed Overall Cognitive Status: Within Functional Limits for tasks assessed                                 General Comments: Following commands and oriented        Exercises     Shoulder Instructions       General Comments      Pertinent Vitals/ Pain  Pain Assessment: Faces Faces Pain Scale: Hurts little more Pain Location: LLE Pain Descriptors / Indicators: Aching;Sore Pain Intervention(s): Monitored during session;Limited activity within patient's tolerance;Repositioned;Patient requesting pain meds-RN notified;Premedicated before session  Home Living                                          Prior Functioning/Environment              Frequency  Min 2X/week        Progress Toward Goals  OT Goals(current goals can now be found in the care plan section)  Progress towards OT goals: Progressing toward goals  Acute Rehab OT Goals Patient Stated Goal: get back home  to go to [facility] OT Goal Formulation: With patient Time For Goal Achievement: 11/18/18 Potential to Achieve Goals: Good ADL Goals Pt Will Perform Lower Body Bathing: with modified independence;with adaptive equipment;sitting/lateral leans Pt Will Perform Lower Body Dressing: with min guard assist;sit to/from stand Pt Will Transfer to Toilet: with supervision;ambulating Pt Will Perform Toileting - Clothing Manipulation and hygiene: with supervision;sit to/from stand Additional ADL Goal #1: Pt will perform bed mobility at mod I level prior to engaging in ADL  Plan Discharge plan remains appropriate;Frequency remains appropriate    Co-evaluation                 AM-PAC OT "6 Clicks" Daily Activity     Outcome Measure   Help from another person eating meals?: None Help from another person taking care of personal grooming?: A Little Help from another person toileting, which includes using toliet, bedpan, or urinal?: A Lot Help from another person bathing (including washing, rinsing, drying)?: A Lot Help from another person to put on and taking off regular upper body clothing?: A Little Help from another person to put on and taking off regular lower body clothing?: A Little 6 Click Score: 17    End of Session Equipment Utilized During Treatment: Rolling walker  OT Visit Diagnosis: Unsteadiness on feet (R26.81);Other abnormalities of gait and mobility (R26.89);Pain;History of falling (Z91.81);Muscle weakness (generalized) (M62.81) Pain - Right/Left: Left Pain - part of body: Leg   Activity Tolerance Patient limited by pain;Patient tolerated treatment well   Patient Left with call bell/phone within reach;in chair;with chair alarm set   Nurse Communication Mobility status        Time: 0254-2706 OT Time Calculation (min): 16 min  Charges: OT General Charges $OT Visit: 1 Visit OT Treatments $Self Care/Home Management : 8-22 mins  Secretary, OTR/L Acute  Rehab Pager: [phone] Office: [place] 11/14/2018, 12:53 PM

## 2018-11-14 NOTE — Progress Notes (Signed)
Patient will DC to: [facility]  DC Date: 11/14/2018 Family Notified: Jaymes Graff, daughter  Transport [id number]   RN, patient, and facility notified of DC. Discharge Summary sent to facility. RN given number for [phone], Room 225-B. DC packet on chart. Ambulance transport requested for patient.   Clinical Social Worker signing off. Antony Blackbird, [facility] Clinical Social Worker [phone]

## 2018-11-14 NOTE — Clinical Social Work Placement (Signed)
   CLINICAL SOCIAL WORK PLACEMENT  NOTE  Date:  11/14/2018  Patient Details  Name: Gabriella [name] MRN: [id number] Date of Birth: Jul 19, 1941  Clinical Social Work is seeking post-discharge placement for this patient at the Skilled  Nursing Facility level of care (*CSW will initial, date and re-position this form in  chart as items are completed):  Yes   Patient/family provided with [facility] Clinical Social Work Department's list of facilities offering this level of care within the geographic area requested by the patient (or if unable, by the patient's family).  Yes   Patient/family informed of their freedom to choose among providers that offer the needed level of care, that participate in Medicare, Medicaid or managed care program needed by the patient, have an available bed and are willing to accept the patient.  Yes   Patient/family informed of [facility]'s ownership interest in [facility] and [facility], as well as of the fact that they are under no obligation to receive care at these facilities.  PASRR submitted to EDS on       PASRR number received on 11/09/18     Existing PASRR number confirmed on       FL2 transmitted to all facilities in geographic area requested by pt/family on       FL2 transmitted to all facilities within larger geographic area on       Patient informed that his/her managed care company has contracts with or will negotiate with certain facilities, including the following:        Yes   Patient/family informed of bed offers received.  Patient chooses bed at [facility]     Physician recommends and patient chooses bed at      Patient to be transferred to [facility] on 11/14/18.  Patient to be transferred to facility by [facility]      Patient family notified on 11/14/18 of transfer.  Name of family member notified:  Jaymes Graff, daughter      PHYSICIAN       Additional Comment:     _______________________________________________ Eduard Roux, LCSWA 11/14/2018, 4:24 PM

## 2018-11-14 NOTE — Progress Notes (Signed)
Internal Medicine Attending:   I saw and examined the patient. I reviewed the resident's note and I agree with the resident's findings and plan as documented in the resident's note.  Patient feels well today with no new complaints.  She still has some mild persistent pain in her left lower extremity.  Patient is initially admitted to the hospital with acute gastroenteritis and had her hospital course complicated by aortic emboli in her left popliteal and tibial arteries.  Patient is status post embolectomy and fasciotomy.  Vascular surgery follow-up and recommendations appreciated.  Continue with wound VAC for now.  Will need to continue with therapeutic Lovenox until INR is therapeutic.  Continue pain control for now.  Patient stable for DC to SNF once bed is available.

## 2018-11-14 NOTE — Progress Notes (Signed)
Patient will be ready for discharge once [facility] has called and confirmed they have wound vac for patient.  Antony Blackbird, [facility] Clinical Social Worker [phone]

## 2018-11-16 ENCOUNTER — Telehealth: Payer: Self-pay | Admitting: Vascular Surgery

## 2018-11-16 NOTE — Telephone Encounter (Signed)
-----   Message from Carnella [place] sent at 11/15/2018  9:27 AM EST -----   ----- Message ----- From: Lars Mage, PA-C Sent: 11/13/2018   7:58 AM EST To: [facility] Admin Pool, [facility] Charge Pool   F/U with Dr. Darrick Penna in 2-3 weeks needs ABI's s/p Thrombectomy

## 2018-11-16 NOTE — Telephone Encounter (Signed)
[facility] about appt I sch appt [facility] 11/30/2018 2pm ABI 3pm p/o MD

## 2018-11-19 ENCOUNTER — Encounter (HOSPITAL_COMMUNITY): Payer: Self-pay

## 2018-11-19 ENCOUNTER — Emergency Department (HOSPITAL_COMMUNITY)
Admission: EM | Admit: 2018-11-19 | Discharge: 2018-11-20 | Disposition: A | Discharge status: Home / Self Care | Payer: Medicare (Managed Care) | Source: Home / Self Care | Attending: Emergency Medicine | Admitting: Emergency Medicine

## 2018-11-19 DIAGNOSIS — J449 Chronic obstructive pulmonary disease, unspecified: Secondary | ICD-10-CM | POA: Insufficient documentation

## 2018-11-19 DIAGNOSIS — Z794 Long term (current) use of insulin: Secondary | ICD-10-CM | POA: Insufficient documentation

## 2018-11-19 DIAGNOSIS — T148XXA Other injury of unspecified body region, initial encounter: Secondary | ICD-10-CM | POA: Diagnosis not present

## 2018-11-19 DIAGNOSIS — R58 Hemorrhage, not elsewhere classified: Secondary | ICD-10-CM | POA: Insufficient documentation

## 2018-11-19 DIAGNOSIS — E119 Type 2 diabetes mellitus without complications: Secondary | ICD-10-CM

## 2018-11-19 DIAGNOSIS — Z7722 Contact with and (suspected) exposure to environmental tobacco smoke (acute) (chronic): Secondary | ICD-10-CM | POA: Insufficient documentation

## 2018-11-19 DIAGNOSIS — Z79899 Other long term (current) drug therapy: Secondary | ICD-10-CM

## 2018-11-19 DIAGNOSIS — I10 Essential (primary) hypertension: Secondary | ICD-10-CM

## 2018-11-19 DIAGNOSIS — I97618 Postprocedural hemorrhage and hematoma of a circulatory system organ or structure following other circulatory system procedure: Secondary | ICD-10-CM | POA: Diagnosis not present

## 2018-11-19 LAB — CBG MONITORING, ED: Glucose-Capillary: 148 mg/dL — ABNORMAL HIGH (ref 70–99)

## 2018-11-19 NOTE — ED Triage Notes (Signed)
Wound vac dressing changed x 2 or 3 today.

## 2018-11-19 NOTE — Consult Note (Signed)
Hospital Consult    Reason for Consult:  Bleeding from wound vac Referring Physician:  ED MRN #:  [id number]  History of Present Illness: This is a 78 y.o. female with recent left lower extremity thromboembolectomy and 4 compartment fasciotomy.  She was discharged to [facility] with wound VAC on the lateral fasciotomy site and on Coumadin with Lovenox bridge.  Today she had 2 episodes of clogging her wound VAC and bleeding underneath is now in the emergency department for evaluation.  Overall she feels fine.  States that the left foot has tingling feelings feeling like it is asleep which is stable since surgery.  Past Medical History:  Diagnosis Date  . Arthritis   . COPD (chronic obstructive pulmonary disease) (HCC)   . Diabetes mellitus without complication (HCC)   . Hypertension     Past Surgical History:  Procedure Laterality Date  . CATARACT EXTRACTION Right 11/2017  . EMBOLECTOMY Left 11/05/2018   Procedure: EMBOLECTOMY LEFT POPLITEAL ARTERY;  Surgeon: Sherren Kerns, MD;  Location: [facility] OR;  Service: Vascular;  Laterality: Left;  . FASCIOTOMY Left 11/05/2018   Procedure: Four compartment Fasciotomy left lower leg;  Surgeon: Sherren Kerns, MD;  Location: [facility] OR;  Service: Vascular;  Laterality: Left;  . PATCH ANGIOPLASTY Left 11/05/2018   Procedure: Patch Angioplasty LEFT POPLITEAL ARTERY;  Surgeon: Sherren Kerns, MD;  Location: [facility] OR;  Service: Vascular;  Laterality: Left;    Allergies  Allergen Reactions  . Other Diarrhea, Nausea And Vomiting, Anxiety and Other (See Comments)    Unknown pain medication: caused sweating, nervousness, and "Some new pain med given at [facility], but I don't know what it's called." Patient states "it knocked me out and I got sweaty and they gave me benadryl."     Prior to Admission medications   Medication Sig Start Date End Date Taking? Authorizing Provider  albuterol (PROVENTIL HFA) 108 (90 Base) MCG/ACT inhaler Inhale 2  puffs into the lungs every 6 (six) hours as needed for shortness of breath (or coughing).  12/02/16  Yes [provider]  Amino Acids-Protein Hydrolys (FEEDING SUPPLEMENT, PRO-STAT SUGAR FREE 64,) LIQD Take 30 mLs by mouth 3 (three) times daily.   Yes [provider]  antiseptic oral rinse (BIOTENE) LIQD 10-15 mLs by Mouth Rinse route 6 (six) times daily.   Yes [provider]  chlorhexidine (PERIDEX) 0.12 % solution 5 mLs See admin instructions. Brush 1 teaspoonful (5 mls) of solution onto teeth and gums after evening mouth care/SPIT OUT EXCESS   Yes [provider]  Cholecalciferol (VITAMIN D3) 1.25 MG (50000 UT) CAPS Take 50,000 Units by mouth every Sunday.   Yes [provider]  Dextran 70-Hypromellose (ARTIFICIAL TEARS PF OP) Place 1 drop into both eyes 2 (two) times daily.   Yes [provider]  DULoxetine (CYMBALTA) 60 MG capsule Take 60 mg by mouth daily. 12/02/16  Yes [provider]  enoxaparin (LOVENOX) 100 MG/ML injection Inject 0.9 mLs (90 mg total) into the skin every 12 (twelve) hours. 11/14/18  Yes Claudean Severance, MD  Fluticasone-Salmeterol (ADVAIR DISKUS) 250-50 MCG/DOSE AEPB Inhale 1 puff into the lungs 2 (two) times daily.   Yes [provider]  gabapentin (NEURONTIN) 600 MG tablet Take 600 mg by mouth 2 (two) times daily.  12/02/16  Yes [provider]  insulin aspart (NOVOLOG FLEXPEN) 100 UNIT/ML FlexPen Inject 10 Units into the skin once as needed for up to 1 day (for a BGL of  504). Patient taking differently: Inject 10 Units into the skin See admin instructions. Inject 2-10 units into the skin 3 times a day before meals, per sliding scale: BGL 151-200 = 2 units; 201-250 = 4 units; 251-300 = 6 units; 301-350 = 8 units, 351-400 = 10 units; >400, call MD 11/14/18 11/19/18 Yes Claudean Severance, MD  Insulin Glargine (BASAGLAR KWIKPEN) 100 UNIT/ML SOPN Inject 22 Units into the skin at bedtime.    Yes  [provider]  ipratropium-albuterol (DUONEB) 0.5-2.5 (3) MG/3ML SOLN Take 3 mLs by nebulization every 4 (four) hours as needed. Patient taking differently: Take 3 mLs by nebulization every 4 (four) hours as needed (for shortness of breath).  12/06/17 11/19/18 Yes Chundi, Vahini, MD  lisinopril (PRINIVIL,ZESTRIL) 30 MG tablet Take 30 mg by mouth See admin instructions. Take 30 mg by mouth once a day and hold if Systolic reading is <100   Yes [provider]  magnesium hydroxide (MILK OF MAGNESIA) 400 MG/5ML suspension Take 30 mLs by mouth daily as needed for mild constipation.   Yes [provider]  Menthol, Topical Analgesic, (BIOFREEZE EX) Apply 1 application topically See admin instructions. Apply to affected areas 2 times a day for pain   Yes [provider]  metFORMIN (GLUCOPHAGE) 1000 MG tablet Take 1,000 mg by mouth 2 (two) times daily with a meal.   Yes [provider]  miconazole (MICRO GUARD) 2 % powder Apply 1 application topically See admin instructions. Apply to abdominal folds and perineum 2 times a day   Yes [provider]  oxyCODONE 10 MG TABS Take 1 tablet (10 mg total) by mouth every 4 (four) hours as needed for severe pain. Patient taking differently: Take 10 mg by mouth every 6 (six) hours as needed (for pain).  11/14/18  Yes Claudean Severance, MD  polyethylene glycol powder (GLYCOLAX/MIRALAX) powder Take 17 g by mouth daily.   Yes [provider]  ramelteon (ROZEREM) 8 MG tablet Take 8 mg by mouth at bedtime.   Yes [provider]  rOPINIRole (REQUIP XL) 2 MG 24 hr tablet Take 2 mg by mouth at bedtime.   Yes [provider]  rosuvastatin (CRESTOR) 20 MG tablet Take 20 mg by mouth at bedtime.   Yes [provider]  sennosides-docusate sodium (SENOKOT-S) 8.6-50 MG tablet Take 2 tablets by mouth at bedtime.   Yes [provider]  traZODone (DESYREL) 50 MG tablet Take 50 mg by mouth at  bedtime.  09/01/17  Yes [provider]  warfarin (COUMADIN) 7.5 MG tablet Take 1 tablet (7.5 mg total) by mouth daily. 11/14/18  Yes Claudean Severance, MD  acetaminophen (TYLENOL) 650 MG CR tablet Take 650 mg by mouth See admin instructions. Take 650 mg by mouth two times a day and an additional 650 mg at midday as needed for breakthrough pain    [provider]  diphenhydramine-acetaminophen (TYLENOL PM) 25-500 MG TABS tablet Take 2 tablets by mouth at bedtime.     [provider]  Melatonin 10 MG CAPS Take 10 mg by mouth at bedtime.    [provider]  mineral oil-hydrophilic petrolatum (AQUAPHOR) ointment Apply 1 application topically See admin instructions. Apply to dry skin daily and after bathing    [provider]  Turmeric 500 MG CAPS Take 500 mg by mouth 2 (two) times daily.    [provider]    Social History   Socioeconomic History  . Marital status: Widowed  Spouse name: Not on file  . Number of children: Not on file  . Years of education: Not on file  . Highest education level: Not on file  Occupational History  . Not on file  Social Needs  . Financial resource strain: Not on file  . Food insecurity:    Worry: Not on file    Inability: Not on file  . Transportation needs:    Medical: Not on file    Non-medical: Not on file  Tobacco Use  . Smoking status: Passive Smoke Exposure - Never Smoker  . Smokeless tobacco: Never Used  Substance and Sexual Activity  . Alcohol use: No    Frequency: Never  . Drug use: No  . Sexual activity: Never  Lifestyle  . Physical activity:    Days per week: Not on file    Minutes per session: Not on file  . Stress: Not on file  Relationships  . Social connections:    Talks on phone: Not on file    Gets together: Not on file    Attends religious service: Not on file    Active member of club or organization: Not on file    Attends meetings of clubs or organizations: Not on  file    Relationship status: Not on file  . Intimate partner violence:    Fear of current or ex partner: Not on file    Emotionally abused: Not on file    Physically abused: Not on file    Forced sexual activity: Not on file  Other Topics Concern  . Not on file  Social History Narrative  . Not on file     History reviewed. No pertinent family history.  ROS: Tingling of left foot  Physical Examination  Vitals:   11/19/18 2218  BP: 123/61  Pulse: 94  Resp: 16  Temp: 98.3 F (36.8 C)  SpO2: 100%   There is no height or weight on file to calculate BMI.  General: No acute distress Gait: Not observed HENT: WNL, normocephalic Pulmonary: normal non-labored breathing Cardiac: 2+ palpable left anterior tibial pulse Extremities: Left medial fasciotomy incision closed with staples, left lateral incision healthy minimal granulation tissue is oozing from inferior aspect of wound Neurologic: A&O X 3   CBC    Component Value Date/Time   WBC 10.2 11/12/2018 0314   RBC 3.17 (L) 11/12/2018 0314   HGB 9.6 (L) 11/12/2018 0314   HCT 29.2 (L) 11/12/2018 0314   PLT 158 11/12/2018 0314   MCV 92.1 11/12/2018 0314   MCH 30.3 11/12/2018 0314   MCHC 32.9 11/12/2018 0314   RDW 13.6 11/12/2018 0314   LYMPHSABS 3.4 11/02/2018 1340   MONOABS 1.8 (H) 11/02/2018 1340   EOSABS 0.5 11/02/2018 1340   BASOSABS 0.1 11/02/2018 1340    BMET    Component Value Date/Time   NA 139 11/11/2018 0554   K 4.4 11/11/2018 0554   CL 100 11/11/2018 0554   CO2 32 11/11/2018 0554   GLUCOSE 224 (H) 11/11/2018 0554   BUN 14 11/11/2018 0554   CREATININE 0.91 11/11/2018 0554   CALCIUM 8.1 (L) 11/11/2018 0554   GFRNONAA >60 11/11/2018 0554   GFRAA >60 11/11/2018 0554    COAGS: Lab Results  Component Value Date   INR 1.5 (H) 11/14/2018   INR 1.85 11/13/2018   INR 1.95 11/12/2018     Non-Invasive Vascular Imaging:   No studies  ASSESSMENT/PLAN: This is a 78 y.o. female here with oozing and  unable to keep her wound VAC adhered to her left lateral fasciotomy site.  There is some oozing although her INR is subtherapeutic.  I removed the wound VAC there is nothing that need surgical attention at this time.  I placed a wet-to-dry with an Ace bandage.  She does not need admission from vascular standpoint can return to using the wound VAC tomorrow at [facility].  She has follow-up in our office in 11 days for staple removal.    Jillian Warth C. Randie Heinz, MD [facility] Office: [phone] Pager: [phone]

## 2018-11-19 NOTE — ED Provider Notes (Signed)
[facility] EMERGENCY DEPARTMENT Provider Note   CSN: [id number] Arrival date & time: 11/19/18  2213    History   Chief Complaint Chief Complaint  Patient presents with  . Wound Check    HPI Gabriella [name] is a 78 y.o. female.     Gabriella [name] is a 78 y.o. female with a history of hypertension, diabetes, COPD, arthritis and recent thromboembolectomy of the left lower leg, who presents via EMS from [facility] for evaluation of bleeding from her wound VAC site.  Per [facility] patient arrived on the 25th, and yesterday and today they have had issues with the wound VAC malfunctioning it frequently says that the canister is full even though there is little to no drainage in the canister or it says that the tubing is blocked.  They tried changing the wound VAC dressing multiple times today but was unable to get the wound VAC to function properly and then the area began bleeding.  Nurse reports that she left the wound with a simple dressing over it for about 30 minutes and when she went back to check on the patient there was blood all along the patient's leg in the bed, she is currently on blood thinners.  Because they were unable to get the wound VAC back on and functioning they sent her here for further evaluation.  Patient reports that ever since her embolectomy she has had pain in the leg but it slightly worse today, did receive 10 mg of OxyIR prior to leaving to the facility.  She denies any new numbness or weakness in the leg.  Per chart review patient had a embolectomy completed by Dr. Jettie Booze on 2/16 with a fasciotomy     Past Medical History:  Diagnosis Date  . Arthritis   . COPD (chronic obstructive pulmonary disease) (HCC)   . Diabetes mellitus without complication (HCC)   . Hypertension     Patient Active Problem List   Diagnosis Date Noted  . 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 Peripheral vascular disease (HCC)  11/05/2018  . AKI (acute kidney injury) (HCC)   . Diarrhea   . Hyperglycemia   . Gastroenteritis 11/02/2018  . Pressure injury of skin 12/04/2017  . Diabetic ketoacidosis (HCC) 12/03/2017  . Acute respiratory failure with hypoxia (HCC) 12/02/2017  . Influenza B 12/02/2017    Past Surgical History:  Procedure Laterality Date  . CATARACT EXTRACTION Right 11/2017  . EMBOLECTOMY Left 11/05/2018   Procedure: EMBOLECTOMY LEFT POPLITEAL ARTERY;  Surgeon: Sherren Kerns, MD;  Location: [facility] OR;  Service: Vascular;  Laterality: Left;  . FASCIOTOMY Left 11/05/2018   Procedure: Four compartment Fasciotomy left lower leg;  Surgeon: Sherren Kerns, MD;  Location: [facility] OR;  Service: Vascular;  Laterality: Left;  . PATCH ANGIOPLASTY Left 11/05/2018   Procedure: Patch Angioplasty LEFT POPLITEAL ARTERY;  Surgeon: Sherren Kerns, MD;  Location: [facility] OR;  Service: Vascular;  Laterality: Left;     OB History   No obstetric history on file.      Home Medications    Prior to Admission medications   Medication Sig Start Date End Date Taking? Authorizing Provider  albuterol (PROVENTIL HFA) 108 (90 Base) MCG/ACT inhaler Inhale 2 puffs into the lungs every 6 (six) hours as needed for shortness of breath (or coughing).  12/02/16  Yes [provider]  Amino Acids-Protein Hydrolys (FEEDING SUPPLEMENT, PRO-STAT SUGAR FREE 64,) LIQD Take 30 mLs by mouth  3 (three) times daily.   Yes [provider]  antiseptic oral rinse (BIOTENE) LIQD 10-15 mLs by Mouth Rinse route 6 (six) times daily.   Yes [provider]  chlorhexidine (PERIDEX) 0.12 % solution 5 mLs See admin instructions. Brush 1 teaspoonful (5 mls) of solution onto teeth and gums after evening mouth care/SPIT OUT EXCESS   Yes [provider]  Cholecalciferol (VITAMIN D3) 1.25 MG (50000 UT) CAPS Take 50,000 Units by mouth every Sunday.   Yes [provider]  Dextran 70-Hypromellose (ARTIFICIAL TEARS PF OP) Place  1 drop into both eyes 2 (two) times daily.   Yes [provider]  DULoxetine (CYMBALTA) 60 MG capsule Take 60 mg by mouth daily. 12/02/16  Yes [provider]  enoxaparin (LOVENOX) 100 MG/ML injection Inject 0.9 mLs (90 mg total) into the skin every 12 (twelve) hours. 11/14/18  Yes Claudean Severance, MD  Fluticasone-Salmeterol (ADVAIR DISKUS) 250-50 MCG/DOSE AEPB Inhale 1 puff into the lungs 2 (two) times daily.   Yes [provider]  gabapentin (NEURONTIN) 600 MG tablet Take 600 mg by mouth 2 (two) times daily.  12/02/16  Yes [provider]  insulin aspart (NOVOLOG FLEXPEN) 100 UNIT/ML FlexPen Inject 10 Units into the skin once as needed for up to 1 day (for a BGL of 504). Patient taking differently: Inject 10 Units into the skin See admin instructions. Inject 2-10 units into the skin 3 times a day before meals, per sliding scale: BGL 151-200 = 2 units; 201-250 = 4 units; 251-300 = 6 units; 301-350 = 8 units, 351-400 = 10 units; >400, call MD 11/14/18 11/19/18 Yes Claudean Severance, MD  Insulin Glargine (BASAGLAR KWIKPEN) 100 UNIT/ML SOPN Inject 22 Units into the skin at bedtime.    Yes [provider]  ipratropium-albuterol (DUONEB) 0.5-2.5 (3) MG/3ML SOLN Take 3 mLs by nebulization every 4 (four) hours as needed. Patient taking differently: Take 3 mLs by nebulization every 4 (four) hours as needed (for shortness of breath).  12/06/17 11/19/18 Yes Chundi, Vahini, MD  lisinopril (PRINIVIL,ZESTRIL) 30 MG tablet Take 30 mg by mouth See admin instructions. Take 30 mg by mouth once a day and hold if Systolic reading is <100   Yes [provider]  magnesium hydroxide (MILK OF MAGNESIA) 400 MG/5ML suspension Take 30 mLs by mouth daily as needed for mild constipation.   Yes [provider]  Menthol, Topical Analgesic, (BIOFREEZE EX) Apply 1 application topically See admin instructions. Apply to affected areas 2 times a day for pain   Yes [provider]  metFORMIN (GLUCOPHAGE) 1000 MG tablet Take 1,000 mg by mouth 2 (two) times daily with a meal.   Yes [provider]  miconazole (MICRO GUARD) 2 % powder Apply 1 application topically See admin instructions. Apply to abdominal folds and perineum 2 times a day   Yes [provider]  oxyCODONE 10 MG TABS Take 1 tablet (10 mg total) by mouth every 4 (four) hours as needed for severe pain. Patient taking differently: Take 10 mg by mouth every 6 (six) hours as needed (for pain).  11/14/18  Yes Claudean Severance, MD  polyethylene glycol powder (GLYCOLAX/MIRALAX) powder Take 17 g by mouth daily.   Yes [provider]  ramelteon (ROZEREM) 8 MG tablet Take 8 mg by mouth at bedtime.   Yes [provider]  rOPINIRole (REQUIP XL) 2 MG 24 hr tablet Take 2 mg by mouth at bedtime.   Yes [provider]  rosuvastatin (CRESTOR) 20 MG tablet Take 20 mg by mouth at bedtime.   Yes [provider]  sennosides-docusate sodium (SENOKOT-S) 8.6-50 MG tablet Take 2 tablets by mouth at bedtime.   Yes [provider]  traZODone (DESYREL) 50 MG tablet Take 50 mg by mouth at bedtime.  09/01/17  Yes [provider]  warfarin (COUMADIN) 7.5 MG tablet Take 1 tablet (7.5 mg total) by mouth daily. 11/14/18  Yes Claudean Severance, MD  acetaminophen (TYLENOL) 650 MG CR tablet Take 650 mg by mouth See admin instructions. Take 650 mg by mouth two times a day and an additional 650 mg at midday as needed for breakthrough pain    [provider]  diphenhydramine-acetaminophen (TYLENOL PM) 25-500 MG TABS tablet Take 2 tablets by mouth at bedtime.     [provider]  Melatonin 10 MG CAPS Take 10 mg by mouth at bedtime.    [provider]  mineral oil-hydrophilic petrolatum (AQUAPHOR) ointment Apply 1 application topically See admin instructions. Apply to dry skin daily and after bathing    [provider]  Turmeric 500 MG  CAPS Take 500 mg by mouth 2 (two) times daily.    [provider]    Family History History reviewed. No pertinent family history.  Social History Social History   Tobacco Use  . Smoking status: Passive Smoke Exposure - Never Smoker  . Smokeless tobacco: Never Used  Substance Use Topics  . Alcohol use: No    Frequency: Never  . Drug use: No     Allergies   Other   Review of Systems Review of Systems  Constitutional: Negative for chills and fever.  Respiratory: Negative for cough and shortness of breath.   Cardiovascular: Positive for leg swelling. Negative for chest pain.  Gastrointestinal: Negative for abdominal pain, nausea and vomiting.  Musculoskeletal: Positive for myalgias. Negative for arthralgias.  Skin: Positive for wound. Negative for color change.  Neurological: Negative for weakness and numbness.  All other systems reviewed and are negative.    Physical Exam Updated Vital Signs BP 123/61   Pulse 94   Temp 98.3 F (36.8 C) (Oral)   Resp 16   SpO2 100%   Physical Exam Vitals signs and nursing note reviewed.  Constitutional:      General: She is not in acute distress.    Appearance: Normal appearance. She is well-developed. She is obese. She is not ill-appearing or diaphoretic.  HENT:     Head: Normocephalic and atraumatic.     Mouth/Throat:     Mouth: Mucous membranes are moist.  Eyes:     General:        Right eye: No discharge.        Left eye: No discharge.  Neck:     Musculoskeletal: Neck supple.  Cardiovascular:     Rate and Rhythm: Normal rate and regular rhythm.     Pulses:          Dorsalis pedis pulses are detected w/ Doppler on the left side.       Posterior tibial pulses are detected w/ Doppler on the left side.     Heart sounds: Normal heart sounds. No murmur. No friction rub. No gallop.   Pulmonary:     Effort: Pulmonary effort is normal. No respiratory distress.     Breath sounds: Normal breath sounds.     Comments:  Respirations equal and unlabored, patient able to speak in full sentences, lungs  clear to auscultation bilaterally Abdominal:     General: Bowel sounds are normal. There is no distension.     Palpations: Abdomen is soft. There is no mass.     Tenderness: There is no abdominal tenderness.     Comments: Abdomen soft, nondistended, nontender to palpation in all quadrants without guarding or peritoneal signs  Musculoskeletal:     Comments: Left lower leg with staples along the medial aspect which are intact with no surrounding erythema, swelling or purulent drainage.  Lateral aspect of the leg with wound VAC dressing in place, dark red blood beneath the transparent wound VAC dressing and some blood leaking from the dressing but no active bleeding.  Wound VAC is not functioning (See photos below)  Neurological:     Mental Status: She is alert and oriented to person, place, and time.     Coordination: Coordination normal.  Psychiatric:        Mood and Affect: Mood normal.        Behavior: Behavior normal.          ED Treatments / Results  Labs (all labs ordered are listed, but only abnormal results are displayed) Labs Reviewed  CBC - Abnormal; Notable for the following components:      Result Value   RBC 3.08 (*)    Hemoglobin 9.3 (*)    HCT 29.2 (*)    All other components within normal limits  PROTIME-INR - Abnormal; Notable for the following components:   Prothrombin Time 23.1 (*)    INR 2.1 (*)    All other components within normal limits  CBG MONITORING, ED - Abnormal; Notable for the following components:   Glucose-Capillary 148 (*)    All other components within normal limits  BASIC METABOLIC PANEL    EKG None  Radiology No results found.  Procedures Procedures (including critical care time)  Medications Ordered in ED Medications - No data to display   Initial Impression / Assessment and Plan / ED Course  I have reviewed the triage vital signs and the nursing  notes.  Pertinent labs & imaging results that were available during my care of the patient were reviewed by me and considered in my medical decision making (see chart for details).  Patient presents from [facility] for evaluation of bleeding around her wound VAC site and wound VAC malfunction.  Had thromboembolectomy of the left popliteal artery on 2/16 while hospitalized, and had fasciotomy with this.  Is currently on anticoagulation.  There is tenderness and swelling around the wound VAC site with dark red blood in the dressing but no active bleeding.  Distal pulses confirmed with Doppler.  11:00 PM Case discussed with Dr. Randie Heinz with Vascular surgery, who will come in and see the patient  11:45 PM Dr. Randie Heinz has seen and evaluated patient, he removed wound vac and there was some slight oozing, but nothing requiring surgical attention. Placed wet-to-dry dressing and reports as long as labs look okay and bleeding remains controlled patient can return to facility and continue with wet-to-dry dressings and resume wound vac tomorrow.  Labs returned, hemoglobin is stable compared to labs at discharge on 2/25, no leukocytosis, INR is 2.1, so lovenox can be discontinued, mild hyponatremia of 132, this is likely in the setting of mild hyperglycemia of 168, will have her facility recheck this with continue treatment of her diabetes.  Dressing rechecked by myself, and there is no further bleeding since dressing was changed, pt is stable for discharge  back to facility. Follow up with Vascular clinic in 11 days for staple removal.  Patient discussed with Dr. Fredderick Phenix, who saw patient as well and agrees with plan.   Final Clinical Impressions(s) / ED Diagnoses   Final diagnoses:  Bleeding from wound    ED Discharge Orders    None       Dartha Lodge, [place] 11/20/18 0038    Rolan Bucco, MD 11/20/18 [phone]

## 2018-11-19 NOTE — ED Triage Notes (Signed)
To room via EMS from [facility].  Wound vac on LL lateral leg.  Bleeding from around dressing.  Wound vac off d/t cannister full or tubing blocked.  Site swollen.

## 2018-11-20 ENCOUNTER — Encounter (HOSPITAL_COMMUNITY): Payer: Self-pay | Admitting: Emergency Medicine

## 2018-11-20 ENCOUNTER — Encounter (HOSPITAL_COMMUNITY)
Admission: EM | Disposition: A | Discharge status: Skilled Nursing Facility | Payer: Self-pay | Source: Skilled Nursing Facility | Attending: Vascular Surgery

## 2018-11-20 ENCOUNTER — Inpatient Hospital Stay (HOSPITAL_COMMUNITY)
Admission: EM | Admit: 2018-11-20 | Discharge: 2018-12-05 | DRG: 908 | Disposition: A | Discharge status: Skilled Nursing Facility | Payer: Medicare (Managed Care) | Source: Skilled Nursing Facility | Attending: Vascular Surgery | Admitting: Vascular Surgery

## 2018-11-20 ENCOUNTER — Other Ambulatory Visit: Payer: Self-pay

## 2018-11-20 ENCOUNTER — Inpatient Hospital Stay (HOSPITAL_COMMUNITY): Payer: Medicare (Managed Care) | Admitting: Anesthesiology

## 2018-11-20 DIAGNOSIS — Z7722 Contact with and (suspected) exposure to environmental tobacco smoke (acute) (chronic): Secondary | ICD-10-CM | POA: Diagnosis present

## 2018-11-20 DIAGNOSIS — D6832 Hemorrhagic disorder due to extrinsic circulating anticoagulants: Secondary | ICD-10-CM | POA: Diagnosis present

## 2018-11-20 DIAGNOSIS — Z79899 Other long term (current) drug therapy: Secondary | ICD-10-CM

## 2018-11-20 DIAGNOSIS — Z7901 Long term (current) use of anticoagulants: Secondary | ICD-10-CM | POA: Diagnosis not present

## 2018-11-20 DIAGNOSIS — E119 Type 2 diabetes mellitus without complications: Secondary | ICD-10-CM | POA: Diagnosis present

## 2018-11-20 DIAGNOSIS — T148XXA Other injury of unspecified body region, initial encounter: Secondary | ICD-10-CM

## 2018-11-20 DIAGNOSIS — Z79890 Hormone replacement therapy (postmenopausal): Secondary | ICD-10-CM | POA: Diagnosis not present

## 2018-11-20 DIAGNOSIS — R58 Hemorrhage, not elsewhere classified: Secondary | ICD-10-CM | POA: Diagnosis present

## 2018-11-20 DIAGNOSIS — D62 Acute posthemorrhagic anemia: Secondary | ICD-10-CM | POA: Diagnosis not present

## 2018-11-20 DIAGNOSIS — T45515A Adverse effect of anticoagulants, initial encounter: Secondary | ICD-10-CM | POA: Diagnosis present

## 2018-11-20 DIAGNOSIS — Y838 Other surgical procedures as the cause of abnormal reaction of the patient, or of later complication, without mention of misadventure at the time of the procedure: Secondary | ICD-10-CM | POA: Diagnosis present

## 2018-11-20 DIAGNOSIS — M1990 Unspecified osteoarthritis, unspecified site: Secondary | ICD-10-CM | POA: Diagnosis present

## 2018-11-20 DIAGNOSIS — J449 Chronic obstructive pulmonary disease, unspecified: Secondary | ICD-10-CM | POA: Diagnosis present

## 2018-11-20 DIAGNOSIS — Z7951 Long term (current) use of inhaled steroids: Secondary | ICD-10-CM

## 2018-11-20 DIAGNOSIS — D649 Anemia, unspecified: Secondary | ICD-10-CM

## 2018-11-20 DIAGNOSIS — I97618 Postprocedural hemorrhage and hematoma of a circulatory system organ or structure following other circulatory system procedure: Principal | ICD-10-CM | POA: Diagnosis present

## 2018-11-20 DIAGNOSIS — I739 Peripheral vascular disease, unspecified: Secondary | ICD-10-CM

## 2018-11-20 DIAGNOSIS — Z794 Long term (current) use of insulin: Secondary | ICD-10-CM | POA: Diagnosis not present

## 2018-11-20 DIAGNOSIS — I10 Essential (primary) hypertension: Secondary | ICD-10-CM | POA: Diagnosis present

## 2018-11-20 DIAGNOSIS — I9789 Other postprocedural complications and disorders of the circulatory system, not elsewhere classified: Secondary | ICD-10-CM

## 2018-11-20 HISTORY — PX: I & D EXTREMITY: SHX5045

## 2018-11-20 LAB — BASIC METABOLIC PANEL
ANION GAP: 9 (ref 5–15)
Anion gap: 7 (ref 5–15)
BUN: 15 mg/dL (ref 8–23)
BUN: 28 mg/dL — ABNORMAL HIGH (ref 8–23)
CALCIUM: 8 mg/dL — AB (ref 8.9–10.3)
CO2: 24 mmol/L (ref 22–32)
CO2: 26 mmol/L (ref 22–32)
Calcium: 8 mg/dL — ABNORMAL LOW (ref 8.9–10.3)
Chloride: 105 mmol/L (ref 98–111)
Chloride: 99 mmol/L (ref 98–111)
Creatinine, Ser: 0.84 mg/dL (ref 0.44–1.00)
Creatinine, Ser: 1.05 mg/dL — ABNORMAL HIGH (ref 0.44–1.00)
GFR calc Af Amer: >60 mL/min (ref >60)
GFR calc Af Amer: >60 mL/min (ref >60)
GFR calc non Af Amer: 52 mL/min — ABNORMAL LOW (ref >60)
GFR calc non Af Amer: >60 mL/min (ref >60)
Glucose, Bld: 168 mg/dL — ABNORMAL HIGH (ref 70–99)
Glucose, Bld: 180 mg/dL — ABNORMAL HIGH (ref 70–99)
Potassium: 5.1 mmol/L (ref 3.5–5.1)
Potassium: 5.3 mmol/L — ABNORMAL HIGH (ref 3.5–5.1)
Sodium: 132 mmol/L — ABNORMAL LOW (ref 135–145)
Sodium: 138 mmol/L (ref 135–145)

## 2018-11-20 LAB — CBC WITH DIFFERENTIAL/PLATELET
Abs Immature Granulocytes: 0.02 10*3/uL (ref 0.00–0.07)
Basophils Absolute: 0.1 10*3/uL (ref 0.0–0.1)
Basophils Relative: 1 %
Eosinophils Absolute: 0.5 10*3/uL (ref 0.0–0.5)
Eosinophils Relative: 8 %
HEMATOCRIT: 24.5 % — AB (ref 36.0–46.0)
Hemoglobin: 7.5 g/dL — ABNORMAL LOW (ref 12.0–15.0)
IMMATURE GRANULOCYTES: 0 %
Lymphocytes Relative: 45 %
Lymphs Abs: 2.8 10*3/uL (ref 0.7–4.0)
MCH: 29.5 pg (ref 26.0–34.0)
MCHC: 30.6 g/dL (ref 30.0–36.0)
MCV: 96.5 fL (ref 80.0–100.0)
MONOS PCT: 7 %
Monocytes Absolute: 0.5 10*3/uL (ref 0.1–1.0)
Neutro Abs: 2.5 10*3/uL (ref 1.7–7.7)
Neutrophils Relative %: 39 %
Platelets: 212 10*3/uL (ref 150–400)
RBC: 2.54 MIL/uL — ABNORMAL LOW (ref 3.87–5.11)
RDW: 12.9 % (ref 11.5–15.5)
WBC: 6.3 10*3/uL (ref 4.0–10.5)
nRBC: 0 % (ref 0.0–0.2)

## 2018-11-20 LAB — CBC
HCT: 29.2 % — ABNORMAL LOW (ref 36.0–46.0)
Hemoglobin: 9.3 g/dL — ABNORMAL LOW (ref 12.0–15.0)
MCH: 30.2 pg (ref 26.0–34.0)
MCHC: 31.8 g/dL (ref 30.0–36.0)
MCV: 94.8 fL (ref 80.0–100.0)
NRBC: 0 % (ref 0.0–0.2)
PLATELETS: 254 10*3/uL (ref 150–400)
RBC: 3.08 MIL/uL — ABNORMAL LOW (ref 3.87–5.11)
RDW: 12.9 % (ref 11.5–15.5)
WBC: 7.3 10*3/uL (ref 4.0–10.5)

## 2018-11-20 LAB — PROTIME-INR
INR: 2.1 — ABNORMAL HIGH (ref 0.8–1.2)
INR: 2.4 — ABNORMAL HIGH (ref 0.8–1.2)
Prothrombin Time: 23.1 seconds — ABNORMAL HIGH (ref 11.4–15.2)
Prothrombin Time: 25.7 seconds — ABNORMAL HIGH (ref 11.4–15.2)

## 2018-11-20 LAB — GLUCOSE, CAPILLARY: Glucose-Capillary: 123 mg/dL — ABNORMAL HIGH (ref 70–99)

## 2018-11-20 LAB — HEMOGLOBIN AND HEMATOCRIT, BLOOD
HCT: 25.9 % — ABNORMAL LOW (ref 36.0–46.0)
Hemoglobin: 7.8 g/dL — ABNORMAL LOW (ref 12.0–15.0)

## 2018-11-20 SURGERY — IRRIGATION AND DEBRIDEMENT EXTREMITY
Anesthesia: General | Laterality: Left

## 2018-11-20 MED ORDER — INSULIN ASPART 100 UNIT/ML ~~LOC~~ SOLN
0.0000 [IU] | Freq: Three times a day (TID) | SUBCUTANEOUS | Status: DC
  Administered 2018-11-21 (×3): 5 [IU] via SUBCUTANEOUS
  Administered 2018-11-22: 3 [IU] via SUBCUTANEOUS
  Administered 2018-11-22 – 2018-11-25 (×5): 2 [IU] via SUBCUTANEOUS
  Administered 2018-11-25: 3 [IU] via SUBCUTANEOUS
  Administered 2018-11-26: 2 [IU] via SUBCUTANEOUS
  Administered 2018-11-26: 3 [IU] via SUBCUTANEOUS
  Administered 2018-11-26 – 2018-12-04 (×6): 2 [IU] via SUBCUTANEOUS

## 2018-11-20 MED ORDER — SODIUM CHLORIDE 0.9 % IV SOLN
INTRAVENOUS | Status: DC | PRN
  Administered 2018-11-20 (×2): via INTRAVENOUS

## 2018-11-20 MED ORDER — LIDOCAINE-EPINEPHRINE (PF) 1 %-1:200000 IJ SOLN
INTRAMUSCULAR | Status: AC
  Filled 2018-11-20: qty 30

## 2018-11-20 MED ORDER — ONDANSETRON HCL 4 MG/2ML IJ SOLN
4.0000 mg | Freq: Four times a day (QID) | INTRAMUSCULAR | Status: DC | PRN
  Filled 2018-11-20: qty 2

## 2018-11-20 MED ORDER — VITAMIN D (ERGOCALCIFEROL) 1.25 MG (50000 UNIT) PO CAPS
50000.0000 [IU] | ORAL_CAPSULE | ORAL | Status: DC
  Administered 2018-11-26 – 2018-12-03 (×2): 50000 [IU] via ORAL
  Filled 2018-11-20 (×2): qty 1

## 2018-11-20 MED ORDER — MAGNESIUM HYDROXIDE 400 MG/5ML PO SUSP: 30.0000 mL | Freq: Every day | ORAL | Status: DC | PRN

## 2018-11-20 MED ORDER — SUCCINYLCHOLINE CHLORIDE 200 MG/10ML IV SOSY
PREFILLED_SYRINGE | INTRAVENOUS | Status: AC
  Filled 2018-11-20: qty 10

## 2018-11-20 MED ORDER — PHENYLEPHRINE HCL 10 MG/ML IJ SOLN
INTRAMUSCULAR | Status: DC | PRN
  Administered 2018-11-20: 80 ug via INTRAVENOUS

## 2018-11-20 MED ORDER — FENTANYL CITRATE (PF) 250 MCG/5ML IJ SOLN
INTRAMUSCULAR | Status: AC
  Filled 2018-11-20: qty 5

## 2018-11-20 MED ORDER — MIDAZOLAM HCL 5 MG/5ML IJ SOLN
INTRAMUSCULAR | Status: DC | PRN
  Administered 2018-11-20 (×2): 1 mg via INTRAVENOUS

## 2018-11-20 MED ORDER — PANTOPRAZOLE SODIUM 40 MG PO TBEC
40.0000 mg | DELAYED_RELEASE_TABLET | Freq: Every day | ORAL | Status: DC
  Administered 2018-11-21 – 2018-12-05 (×15): 40 mg via ORAL
  Filled 2018-11-20 (×15): qty 1

## 2018-11-20 MED ORDER — DEXAMETHASONE SODIUM PHOSPHATE 10 MG/ML IJ SOLN
INTRAMUSCULAR | Status: DC | PRN
  Administered 2018-11-20: 10 mg via INTRAVENOUS

## 2018-11-20 MED ORDER — POTASSIUM CHLORIDE CRYS ER 20 MEQ PO TBCR: 20.0000 meq | EXTENDED_RELEASE_TABLET | Freq: Once | ORAL | Status: DC

## 2018-11-20 MED ORDER — MELATONIN 3 MG PO TABS
3.0000 mg | ORAL_TABLET | Freq: Every day | ORAL | Status: DC
  Administered 2018-11-21 – 2018-12-04 (×14): 3 mg via ORAL
  Filled 2018-11-20 (×14): qty 1

## 2018-11-20 MED ORDER — DEXAMETHASONE SODIUM PHOSPHATE 10 MG/ML IJ SOLN
INTRAMUSCULAR | Status: AC
  Filled 2018-11-20: qty 1

## 2018-11-20 MED ORDER — PHENYLEPHRINE 40 MCG/ML (10ML) SYRINGE FOR IV PUSH (FOR BLOOD PRESSURE SUPPORT)
PREFILLED_SYRINGE | INTRAVENOUS | Status: AC
  Filled 2018-11-20: qty 10

## 2018-11-20 MED ORDER — ROPINIROLE HCL ER 2 MG PO TB24: 2.0000 mg | ORAL_TABLET | Freq: Every day | ORAL | Status: DC

## 2018-11-20 MED ORDER — ONDANSETRON 4 MG PO TBDP
4.0000 mg | ORAL_TABLET | Freq: Once | ORAL | Status: AC
  Administered 2018-11-20: 4 mg via ORAL
  Filled 2018-11-20: qty 1

## 2018-11-20 MED ORDER — ACETAMINOPHEN 325 MG RE SUPP
325.0000 mg | RECTAL | Status: DC | PRN
  Filled 2018-11-20: qty 2

## 2018-11-20 MED ORDER — MIDAZOLAM HCL 2 MG/2ML IJ SOLN
INTRAMUSCULAR | Status: AC
  Filled 2018-11-20: qty 2

## 2018-11-20 MED ORDER — HYDROCODONE-ACETAMINOPHEN 5-325 MG PO TABS
1.0000 | ORAL_TABLET | Freq: Once | ORAL | Status: AC
  Administered 2018-11-20: 1 via ORAL
  Filled 2018-11-20: qty 1

## 2018-11-20 MED ORDER — ALBUTEROL SULFATE (2.5 MG/3ML) 0.083% IN NEBU: 2.5000 mg | INHALATION_SOLUTION | Freq: Four times a day (QID) | RESPIRATORY_TRACT | Status: DC | PRN

## 2018-11-20 MED ORDER — METFORMIN HCL 500 MG PO TABS
1000.0000 mg | ORAL_TABLET | Freq: Two times a day (BID) | ORAL | Status: DC
  Administered 2018-11-21 – 2018-12-05 (×29): 1000 mg via ORAL
  Filled 2018-11-20 (×29): qty 2

## 2018-11-20 MED ORDER — INSULIN ASPART 100 UNIT/ML FLEXPEN: 10.0000 [IU] | PEN_INJECTOR | SUBCUTANEOUS | Status: DC

## 2018-11-20 MED ORDER — PHENOL 1.4 % MT LIQD: 1.0000 | OROMUCOSAL | Status: DC | PRN

## 2018-11-20 MED ORDER — SODIUM CHLORIDE 0.9 % IV SOLN: 250.0000 mL | INTRAVENOUS | Status: DC | PRN

## 2018-11-20 MED ORDER — BIOTENE DRY MOUTH MT LIQD
10.0000 mL | Freq: Every day | OROMUCOSAL | Status: DC
  Administered 2018-11-22 – 2018-11-23 (×6): 15 mL via OROMUCOSAL
  Administered 2018-11-23: 10 mL via OROMUCOSAL
  Administered 2018-11-23 (×2): 15 mL via OROMUCOSAL
  Administered 2018-11-23: 10 mL via OROMUCOSAL
  Administered 2018-11-24 (×2): 15 mL via OROMUCOSAL
  Administered 2018-11-24: 10 mL via OROMUCOSAL
  Administered 2018-11-24: 15 mL via OROMUCOSAL
  Administered 2018-11-25 – 2018-11-29 (×20): 10 mL via OROMUCOSAL
  Administered 2018-11-30: 15 mL via OROMUCOSAL
  Administered 2018-11-30: 10 mL via OROMUCOSAL
  Administered 2018-11-30: 15 mL via OROMUCOSAL
  Administered 2018-12-01 – 2018-12-02 (×5): 10 mL via OROMUCOSAL
  Administered 2018-12-02 – 2018-12-03 (×2): 15 mL via OROMUCOSAL
  Administered 2018-12-03: 10 mL via OROMUCOSAL
  Administered 2018-12-03: 15 mL via OROMUCOSAL
  Administered 2018-12-04 (×3): 10 mL via OROMUCOSAL
  Administered 2018-12-05: 15 mL via OROMUCOSAL
  Filled 2018-11-20: qty 473

## 2018-11-20 MED ORDER — HYDRALAZINE HCL 20 MG/ML IJ SOLN: 5.0000 mg | INTRAMUSCULAR | Status: DC | PRN

## 2018-11-20 MED ORDER — SILVER NITRATE-POT NITRATE 75-25 % EX MISC
1.0000 | Freq: Once | CUTANEOUS | Status: AC
  Administered 2018-11-20: 1 via TOPICAL
  Filled 2018-11-20: qty 1

## 2018-11-20 MED ORDER — LABETALOL HCL 5 MG/ML IV SOLN: 10.0000 mg | INTRAVENOUS | Status: DC | PRN

## 2018-11-20 MED ORDER — INSULIN GLARGINE 100 UNIT/ML ~~LOC~~ SOLN
22.0000 [IU] | Freq: Every day | SUBCUTANEOUS | Status: DC
  Administered 2018-11-21 – 2018-12-01 (×11): 22 [IU] via SUBCUTANEOUS
  Filled 2018-11-20 (×11): qty 0.22

## 2018-11-20 MED ORDER — LIDOCAINE HCL (CARDIAC) PF 100 MG/5ML IV SOSY
PREFILLED_SYRINGE | INTRAVENOUS | Status: DC | PRN
  Administered 2018-11-20: 50 mg via INTRAVENOUS

## 2018-11-20 MED ORDER — MOMETASONE FURO-FORMOTEROL FUM 200-5 MCG/ACT IN AERO
2.0000 | INHALATION_SPRAY | Freq: Two times a day (BID) | RESPIRATORY_TRACT | Status: DC
  Administered 2018-11-21 – 2018-12-05 (×29): 2 via RESPIRATORY_TRACT
  Filled 2018-11-20: qty 8.8

## 2018-11-20 MED ORDER — ALUM & MAG HYDROXIDE-SIMETH 200-200-20 MG/5ML PO SUSP: 15.0000 mL | ORAL | Status: DC | PRN

## 2018-11-20 MED ORDER — GUAIFENESIN-DM 100-10 MG/5ML PO SYRP
15.0000 mL | ORAL_SOLUTION | ORAL | Status: DC | PRN
  Administered 2018-11-22: 15 mL via ORAL
  Filled 2018-11-20: qty 15

## 2018-11-20 MED ORDER — VITAMIN K1 10 MG/ML IJ SOLN
5.0000 mg | Freq: Once | INTRAMUSCULAR | Status: DC
  Filled 2018-11-20: qty 0.5

## 2018-11-20 MED ORDER — SODIUM CHLORIDE 0.9% FLUSH: 3.0000 mL | INTRAVENOUS | Status: DC | PRN

## 2018-11-20 MED ORDER — IPRATROPIUM-ALBUTEROL 0.5-2.5 (3) MG/3ML IN SOLN: 3.0000 mL | RESPIRATORY_TRACT | Status: DC | PRN

## 2018-11-20 MED ORDER — TURMERIC 500 MG PO CAPS: 500.0000 mg | ORAL_CAPSULE | Freq: Two times a day (BID) | ORAL | Status: DC

## 2018-11-20 MED ORDER — PROPOFOL 10 MG/ML IV BOLUS
INTRAVENOUS | Status: DC | PRN
  Administered 2018-11-20: 120 mg via INTRAVENOUS
  Administered 2018-11-20: 30 mg via INTRAVENOUS

## 2018-11-20 MED ORDER — ONDANSETRON HCL 4 MG/2ML IJ SOLN
INTRAMUSCULAR | Status: DC | PRN
  Administered 2018-11-20: 4 mg via INTRAVENOUS

## 2018-11-20 MED ORDER — GABAPENTIN 600 MG PO TABS
600.0000 mg | ORAL_TABLET | Freq: Two times a day (BID) | ORAL | Status: DC
  Administered 2018-11-21 – 2018-12-05 (×29): 600 mg via ORAL
  Filled 2018-11-20 (×29): qty 1

## 2018-11-20 MED ORDER — OXYCODONE HCL 5 MG PO TABS
5.0000 mg | ORAL_TABLET | ORAL | Status: DC | PRN
  Administered 2018-11-21 – 2018-11-22 (×5): 10 mg via ORAL
  Administered 2018-11-22: 5 mg via ORAL
  Administered 2018-11-22: 10 mg via ORAL
  Administered 2018-11-22 – 2018-11-23 (×3): 5 mg via ORAL
  Administered 2018-11-23: 10 mg via ORAL
  Administered 2018-11-23: 5 mg via ORAL
  Administered 2018-11-24 – 2018-12-03 (×38): 10 mg via ORAL
  Administered 2018-12-03: 5 mg via ORAL
  Administered 2018-12-04 – 2018-12-05 (×6): 10 mg via ORAL
  Filled 2018-11-20 (×8): qty 2
  Filled 2018-11-20: qty 1
  Filled 2018-11-20 (×5): qty 2
  Filled 2018-11-20: qty 1
  Filled 2018-11-20 (×19): qty 2
  Filled 2018-11-20: qty 1
  Filled 2018-11-20 (×9): qty 2
  Filled 2018-11-20: qty 1
  Filled 2018-11-20: qty 2
  Filled 2018-11-20: qty 1
  Filled 2018-11-20 (×10): qty 2

## 2018-11-20 MED ORDER — LIDOCAINE HCL 1 % IJ SOLN
INTRAMUSCULAR | Status: DC | PRN
  Administered 2018-11-20: 30 mL

## 2018-11-20 MED ORDER — FENTANYL CITRATE (PF) 100 MCG/2ML IJ SOLN
INTRAMUSCULAR | Status: AC
  Filled 2018-11-20: qty 2

## 2018-11-20 MED ORDER — TRAZODONE HCL 50 MG PO TABS
50.0000 mg | ORAL_TABLET | Freq: Every day | ORAL | Status: DC
  Administered 2018-11-21 – 2018-12-04 (×14): 50 mg via ORAL
  Filled 2018-11-20 (×14): qty 1

## 2018-11-20 MED ORDER — RAMELTEON 8 MG PO TABS
8.0000 mg | ORAL_TABLET | Freq: Every day | ORAL | Status: DC
  Administered 2018-11-21 – 2018-11-30 (×10): 8 mg via ORAL
  Filled 2018-11-20 (×11): qty 1

## 2018-11-20 MED ORDER — MICONAZOLE NITRATE 2 % EX POWD
1.0000 "application " | Freq: Two times a day (BID) | CUTANEOUS | Status: DC
  Administered 2018-11-21 – 2018-12-05 (×24): 1 via TOPICAL
  Filled 2018-11-20: qty 1

## 2018-11-20 MED ORDER — DIPHENHYDRAMINE-APAP (SLEEP) 25-500 MG PO TABS: 2.0000 | ORAL_TABLET | Freq: Every day | ORAL | Status: DC

## 2018-11-20 MED ORDER — MORPHINE SULFATE (PF) 2 MG/ML IV SOLN
2.0000 mg | INTRAVENOUS | Status: DC | PRN
  Administered 2018-11-21 – 2018-12-03 (×2): 2 mg via INTRAVENOUS
  Filled 2018-11-20 (×2): qty 1

## 2018-11-20 MED ORDER — POLYETHYLENE GLYCOL 3350 17 G PO PACK
17.0000 g | PACK | Freq: Every day | ORAL | Status: DC
  Administered 2018-11-21 – 2018-12-05 (×11): 17 g via ORAL
  Filled 2018-11-20 (×12): qty 1

## 2018-11-20 MED ORDER — FENTANYL CITRATE (PF) 100 MCG/2ML IJ SOLN
25.0000 ug | INTRAMUSCULAR | Status: DC | PRN
  Administered 2018-11-20 (×4): 25 ug via INTRAVENOUS

## 2018-11-20 MED ORDER — ROSUVASTATIN CALCIUM 20 MG PO TABS
20.0000 mg | ORAL_TABLET | Freq: Every day | ORAL | Status: DC
  Administered 2018-11-21 – 2018-12-04 (×14): 20 mg via ORAL
  Filled 2018-11-20 (×14): qty 1

## 2018-11-20 MED ORDER — ONDANSETRON HCL 4 MG/2ML IJ SOLN
INTRAMUSCULAR | Status: AC
  Filled 2018-11-20: qty 2

## 2018-11-20 MED ORDER — DOCUSATE SODIUM 100 MG PO CAPS
100.0000 mg | ORAL_CAPSULE | Freq: Two times a day (BID) | ORAL | Status: DC
  Administered 2018-11-21 – 2018-12-05 (×20): 100 mg via ORAL
  Filled 2018-11-20 (×23): qty 1

## 2018-11-20 MED ORDER — DULOXETINE HCL 60 MG PO CPEP
60.0000 mg | ORAL_CAPSULE | Freq: Every day | ORAL | Status: DC
  Administered 2018-11-21 – 2018-12-05 (×15): 60 mg via ORAL
  Filled 2018-11-20 (×15): qty 1

## 2018-11-20 MED ORDER — LIDOCAINE HCL (PF) 1 % IJ SOLN
INTRAMUSCULAR | Status: AC
  Filled 2018-11-20: qty 30

## 2018-11-20 MED ORDER — BUPIVACAINE-EPINEPHRINE (PF) 0.5% -1:200000 IJ SOLN
INTRAMUSCULAR | Status: AC
  Filled 2018-11-20: qty 30

## 2018-11-20 MED ORDER — PRO-STAT SUGAR FREE PO LIQD
30.0000 mL | Freq: Three times a day (TID) | ORAL | Status: DC
  Administered 2018-11-21 – 2018-12-05 (×39): 30 mL via ORAL
  Filled 2018-11-20 (×41): qty 30

## 2018-11-20 MED ORDER — LISINOPRIL 10 MG PO TABS
30.0000 mg | ORAL_TABLET | Freq: Every day | ORAL | Status: DC
  Administered 2018-11-21 – 2018-12-05 (×15): 30 mg via ORAL
  Filled 2018-11-20 (×16): qty 3

## 2018-11-20 MED ORDER — ACETAMINOPHEN 325 MG PO TABS
325.0000 mg | ORAL_TABLET | ORAL | Status: DC | PRN
  Administered 2018-11-21 (×2): 650 mg via ORAL
  Administered 2018-11-21 – 2018-11-22 (×2): 325 mg via ORAL
  Administered 2018-11-25 – 2018-12-03 (×6): 650 mg via ORAL
  Filled 2018-11-20 (×9): qty 2

## 2018-11-20 MED ORDER — 0.9 % SODIUM CHLORIDE (POUR BTL) OPTIME
TOPICAL | Status: DC | PRN
  Administered 2018-11-20: 1000 mL

## 2018-11-20 MED ORDER — LIDOCAINE 2% (20 MG/ML) 5 ML SYRINGE
INTRAMUSCULAR | Status: AC
  Filled 2018-11-20: qty 5

## 2018-11-20 MED ORDER — BUPIVACAINE-EPINEPHRINE (PF) 0.5% -1:200000 IJ SOLN
INTRAMUSCULAR | Status: DC | PRN
  Administered 2018-11-20: 30 mL

## 2018-11-20 MED ORDER — SODIUM CHLORIDE 0.9% FLUSH
3.0000 mL | Freq: Two times a day (BID) | INTRAVENOUS | Status: DC
  Administered 2018-11-21 – 2018-12-05 (×28): 3 mL via INTRAVENOUS

## 2018-11-20 MED ORDER — METOPROLOL TARTRATE 5 MG/5ML IV SOLN: 2.0000 mg | INTRAVENOUS | Status: DC | PRN

## 2018-11-20 SURGICAL SUPPLY — 28 items
BANDAGE ACE 4X5 VEL STRL LF (GAUZE/BANDAGES/DRESSINGS) IMPLANT
BANDAGE ACE 6X5 VEL STRL LF (GAUZE/BANDAGES/DRESSINGS) IMPLANT
BANDAGE ELASTIC 4 VELCRO ST LF (GAUZE/BANDAGES/DRESSINGS) ×2 IMPLANT
BNDG GAUZE ELAST 4 BULKY (GAUZE/BANDAGES/DRESSINGS) ×2 IMPLANT
CANISTER SUCT 3000ML PPV (MISCELLANEOUS) ×2 IMPLANT
COVER SURGICAL LIGHT HANDLE (MISCELLANEOUS) ×2 IMPLANT
COVER WAND RF STERILE (DRAPES) ×2 IMPLANT
ELECT REM PT RETURN 9FT ADLT (ELECTROSURGICAL) ×2
ELECTRODE REM PT RTRN 9FT ADLT (ELECTROSURGICAL) ×1 IMPLANT
GAUZE SPONGE 4X4 12PLY STRL (GAUZE/BANDAGES/DRESSINGS) ×2 IMPLANT
GAUZE SPONGE 4X4 12PLY STRL LF (GAUZE/BANDAGES/DRESSINGS) ×2 IMPLANT
GLOVE BIO SURGEON STRL SZ7.5 (GLOVE) ×2 IMPLANT
GOWN STRL REUS W/ TWL LRG LVL3 (GOWN DISPOSABLE) ×3 IMPLANT
GOWN STRL REUS W/TWL LRG LVL3 (GOWN DISPOSABLE) ×3
KIT BASIN OR (CUSTOM PROCEDURE TRAY) ×2 IMPLANT
KIT TURNOVER KIT B (KITS) ×2 IMPLANT
NS IRRIG 1000ML POUR BTL (IV SOLUTION) ×2 IMPLANT
PACK GENERAL/GYN (CUSTOM PROCEDURE TRAY) IMPLANT
PACK UNIVERSAL I (CUSTOM PROCEDURE TRAY) IMPLANT
PAD ARMBOARD 7.5X6 YLW CONV (MISCELLANEOUS) ×4 IMPLANT
STAPLER VISISTAT 35W (STAPLE) IMPLANT
SUT ETHILON 3 0 PS 1 (SUTURE) IMPLANT
SUT VIC AB 2-0 CTX 36 (SUTURE) IMPLANT
SUT VIC AB 3-0 SH 27 (SUTURE)
SUT VIC AB 3-0 SH 27X BRD (SUTURE) IMPLANT
SUT VICRYL 4-0 PS2 18IN ABS (SUTURE) IMPLANT
TOWEL GREEN STERILE (TOWEL DISPOSABLE) ×2 IMPLANT
WATER STERILE IRR 1000ML POUR (IV SOLUTION) ×2 IMPLANT

## 2018-11-20 NOTE — ED Notes (Signed)
Wound on left lower leg was bleeding through gauze dressing on arrival. Silver nitrate was applied as well as combat gauze. Pts wound and leg re-wrapped. Will observe for more bleeding.

## 2018-11-20 NOTE — ED Notes (Signed)
[facility] called for transport.  

## 2018-11-20 NOTE — ED Provider Notes (Signed)
[facility] EMERGENCY DEPARTMENT Provider Note   CSN: [id number] Arrival date & time: 11/20/18  1411    History   Chief Complaint Chief Complaint  Patient presents with  . Coagulation Disorder    HPI Gabriella [name] is a 78 y.o. female.     Pt presents to the ED today with bleeding from her left leg.  Pt was admitted on 2/13 for a GI bleed.  while in the hospital, she developed LLE pain which was found to have a popliteal and DP embolism to left leg.  On 2/16, she had an emergent left lower extremity thromboembolectomy and 4 compartment fasciotomy.  She was d/c to [facility] with a wound VAC on the lateral fasciotomy site with coumadin and a lovenox bridge.  Wound vac had bleeding underneath, so she was sent to the ED last night.  Dr. Randie Heinz (vascular) was consulted and removed the wound VAC.  He placed a wet to dry dressing with an ACE bandage.  She was sent home.  She returns today because she has had continued bleeding from the leg wound.     Past Medical History:  Diagnosis Date  . Arthritis   . COPD (chronic obstructive pulmonary disease) (HCC)   . Diabetes mellitus without complication (HCC)   . Hypertension     Patient Active Problem List   Diagnosis Date Noted  . Bleeding 11/20/2018  . 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 Peripheral vascular disease (HCC) 11/05/2018  . AKI (acute kidney injury) (HCC)   . Diarrhea   . Hyperglycemia   . Gastroenteritis 11/02/2018  . Pressure injury of skin 12/04/2017  . Diabetic ketoacidosis (HCC) 12/03/2017  . Acute respiratory failure with hypoxia (HCC) 12/02/2017  . Influenza B 12/02/2017    Past Surgical History:  Procedure Laterality Date  . CATARACT EXTRACTION Right 11/2017  . EMBOLECTOMY Left 11/05/2018   Procedure: EMBOLECTOMY LEFT POPLITEAL ARTERY;  Surgeon: Sherren Kerns, MD;  Location: [facility] OR;  Service: Vascular;  Laterality: Left;  . FASCIOTOMY Left 11/05/2018   Procedure:  Four compartment Fasciotomy left lower leg;  Surgeon: Sherren Kerns, MD;  Location: [facility] OR;  Service: Vascular;  Laterality: Left;  . I&D EXTREMITY Left 11/20/2018   Procedure: IRRIGATION AND DEBRIDEMENT LEFT LOWER EXTREMITY;  Surgeon: Sherren Kerns, MD;  Location: [facility] OR;  Service: Vascular;  Laterality: Left;  . PATCH ANGIOPLASTY Left 11/05/2018   Procedure: Patch Angioplasty LEFT POPLITEAL ARTERY;  Surgeon: Sherren Kerns, MD;  Location: [facility] OR;  Service: Vascular;  Laterality: Left;     OB History   No obstetric history on file.      Home Medications    Prior to Admission medications   Medication Sig Start Date End Date Taking? Authorizing Provider  acetaminophen (TYLENOL) 650 MG CR tablet Take 650 mg by mouth See admin instructions. Take 650 mg by mouth two times a day and an additional 650 mg at midday as needed for breakthrough pain    [provider]  albuterol (PROVENTIL HFA) 108 (90 Base) MCG/ACT inhaler Inhale 2 puffs into the lungs every 6 (six) hours as needed for shortness of breath (or coughing).  12/02/16   [provider]  Amino Acids-Protein Hydrolys (FEEDING SUPPLEMENT, PRO-STAT SUGAR FREE 64,) LIQD Take 30 mLs by mouth 3 (three) times daily.    [provider]  antiseptic oral rinse (BIOTENE) LIQD 10-15 mLs by Mouth Rinse route 6 (six) times daily.  [provider]  chlorhexidine (PERIDEX) 0.12 % solution 5 mLs See admin instructions. Brush 1 teaspoonful (5 mls) of solution onto teeth and gums after evening mouth care/SPIT OUT EXCESS    [provider]  Cholecalciferol (VITAMIN D3) 1.25 MG (50000 UT) CAPS Take 50,000 Units by mouth every Sunday.    [provider]  Dextran 70-Hypromellose (ARTIFICIAL TEARS PF OP) Place 1 drop into both eyes 2 (two) times daily.    [provider]  diphenhydramine-acetaminophen (TYLENOL PM) 25-500 MG TABS tablet Take 2 tablets by mouth at bedtime.     [provider]  DULoxetine (CYMBALTA) 60 MG capsule Take 60 mg by mouth daily. 12/02/16   [provider]  enoxaparin (LOVENOX) 100 MG/ML injection Inject 0.9 mLs (90 mg total) into the skin every 12 (twelve) hours. 11/14/18   Claudean Severance, MD  Fluticasone-Salmeterol (ADVAIR DISKUS) 250-50 MCG/DOSE AEPB Inhale 1 puff into the lungs 2 (two) times daily.    [provider]  gabapentin (NEURONTIN) 600 MG tablet Take 600 mg by mouth 2 (two) times daily.  12/02/16   [provider]  insulin aspart (NOVOLOG FLEXPEN) 100 UNIT/ML FlexPen Inject 10 Units into the skin once as needed for up to 1 day (for a BGL of 504). Patient taking differently: Inject 10 Units into the skin See admin instructions. Inject 2-10 units into the skin 3 times a day before meals, per sliding scale: BGL 151-200 = 2 units; 201-250 = 4 units; 251-300 = 6 units; 301-350 = 8 units, 351-400 = 10 units; >400, call MD 11/14/18 11/19/18  Claudean Severance, MD  Insulin Glargine (BASAGLAR KWIKPEN) 100 UNIT/ML SOPN Inject 22 Units into the skin at bedtime.     [provider]  ipratropium-albuterol (DUONEB) 0.5-2.5 (3) MG/3ML SOLN Take 3 mLs by nebulization every 4 (four) hours as needed. Patient taking differently: Take 3 mLs by nebulization every 4 (four) hours as needed (for shortness of breath).  12/06/17 11/19/18  Chundi, Sherlyn Lees, MD  lisinopril (PRINIVIL,ZESTRIL) 30 MG tablet Take 30 mg by mouth See admin instructions. Take 30 mg by mouth once a day and hold if Systolic reading is <100    [provider]  magnesium hydroxide (MILK OF MAGNESIA) 400 MG/5ML suspension Take 30 mLs by mouth daily as needed for mild constipation.    [provider]  Melatonin 10 MG CAPS Take 10 mg by mouth at bedtime.    [provider]  Menthol, Topical Analgesic, (BIOFREEZE EX) Apply 1 application topically See admin instructions. Apply to affected areas 2 times a day for pain    [provider]    metFORMIN (GLUCOPHAGE) 1000 MG tablet Take 1,000 mg by mouth 2 (two) times daily with a meal.    [provider]  miconazole (MICRO GUARD) 2 % powder Apply 1 application topically See admin instructions. Apply to abdominal folds and perineum 2 times a day    [provider]  mineral oil-hydrophilic petrolatum (AQUAPHOR) ointment Apply 1 application topically See admin instructions. Apply to dry skin daily and after bathing    [provider]  oxyCODONE 10 MG TABS Take 1 tablet (10 mg total) by mouth every 4 (four) hours as needed for severe pain. Patient taking differently: Take 10 mg by mouth every 6 (six) hours as needed (for pain).  11/14/18   Claudean Severance, MD  polyethylene glycol powder (GLYCOLAX/MIRALAX) powder Take 17 g by mouth daily.    [provider]  ramelteon (ROZEREM) 8 MG tablet Take 8 mg by mouth at bedtime.    [provider]  rOPINIRole (REQUIP XL) 2 MG 24 hr tablet Take 2 mg by mouth at bedtime.    [provider]  rosuvastatin (CRESTOR) 20 MG tablet Take 20 mg by mouth at bedtime.    [provider]  sennosides-docusate sodium (SENOKOT-S) 8.6-50 MG tablet Take 2 tablets by mouth at bedtime.    [provider]  traZODone (DESYREL) 50 MG tablet Take 50 mg by mouth at bedtime.  09/01/17   [provider]  Turmeric 500 MG CAPS Take 500 mg by mouth 2 (two) times daily.    [provider]  warfarin (COUMADIN) 7.5 MG tablet Take 1 tablet (7.5 mg total) by mouth daily. 11/14/18   Claudean SeveranceKrienke, Marissa M, MD    Family History History reviewed. No pertinent family history.  Social History Social History   Tobacco Use  . Smoking status: Passive Smoke Exposure - Never Smoker  . Smokeless tobacco: Never Used  Substance Use Topics  . Alcohol use: No    Frequency: Never  . Drug use: No     Allergies   Other   Review of Systems Review of Systems  Skin: Positive for wound.  All other  systems reviewed and are negative.    Physical Exam Updated Vital Signs BP (!) 107/46   Pulse 100   Temp 98.5 F (36.9 C) (Oral)   Resp 20   Ht 5\' 3"  (1.6 m)   Wt 93.4 kg   SpO2 97%   BMI 36.48 kg/m   Physical Exam Vitals signs and nursing note reviewed.  Constitutional:      Appearance: Normal appearance.  HENT:     Head: Normocephalic and atraumatic.     Right Ear: External ear normal.     Left Ear: External ear normal.     Nose: Nose normal.     Mouth/Throat:     Mouth: Mucous membranes are moist.  Eyes:     Extraocular Movements: Extraocular movements intact.     Pupils: Pupils are equal, round, and reactive to light.  Neck:     Musculoskeletal: Normal range of motion.  Cardiovascular:     Rate and Rhythm: Normal rate and regular rhythm.     Pulses: Normal pulses.     Heart sounds: Normal heart sounds.  Pulmonary:     Effort: Pulmonary effort is normal.     Breath sounds: Normal breath sounds.  Abdominal:     General: Abdomen is flat.  Musculoskeletal: Normal range of motion.  Skin:    Capillary Refill: Capillary refill takes less than 2 seconds.     Comments: Lateral fasciotomy site with bleeding  Neurological:     General: No focal deficit present.     Mental Status: She is alert and oriented to person, place, and time.  Psychiatric:        Mood and Affect: Mood normal.      ED Treatments / Results  Labs (all labs ordered are listed, but only abnormal results are displayed) Labs Reviewed  HEMOGLOBIN AND HEMATOCRIT, BLOOD - Abnormal; Notable for the following components:      Result Value   Hemoglobin 7.8 (*)    HCT 25.9 (*)    All other components within normal limits  PROTIME-INR - Abnormal; Notable for the following components:   Prothrombin Time 25.7 (*)    INR 2.4 (*)    All other components within  normal limits  CBC WITH DIFFERENTIAL/PLATELET - Abnormal; Notable for the following components:   RBC 2.54 (*)    Hemoglobin 7.5 (*)    HCT  24.5 (*)    All other components within normal limits  BASIC METABOLIC PANEL - Abnormal; Notable for the following components:   Potassium 5.3 (*)    Glucose, Bld 180 (*)    BUN 28 (*)    Creatinine, Ser 1.05 (*)    Calcium 8.0 (*)    GFR calc non Af Amer 52 (*)    All other components within normal limits  CBC - Abnormal; Notable for the following components:   RBC 2.38 (*)    Hemoglobin 7.1 (*)    HCT 22.4 (*)    All other components within normal limits  PROTIME-INR - Abnormal; Notable for the following components:   Prothrombin Time 25.7 (*)    INR 2.4 (*)    All other components within normal limits  GLUCOSE, CAPILLARY - Abnormal; Notable for the following components:   Glucose-Capillary 123 (*)    All other components within normal limits  COMPREHENSIVE METABOLIC PANEL - Abnormal; Notable for the following components:   Glucose, Bld 142 (*)    BUN 25 (*)    Creatinine, Ser 1.04 (*)    Calcium 7.5 (*)    Total Protein 5.4 (*)    Albumin 2.2 (*)    GFR calc non Af Amer 53 (*)    All other components within normal limits  GLUCOSE, CAPILLARY - Abnormal; Notable for the following components:   Glucose-Capillary 202 (*)    All other components within normal limits  GLUCOSE, CAPILLARY - Abnormal; Notable for the following components:   Glucose-Capillary 208 (*)    All other components within normal limits  MRSA PCR SCREENING  TYPE AND SCREEN  PREPARE RBC (CROSSMATCH)    EKG None  Radiology No results found.  Procedures Procedures (including critical care time)  Medications Ordered in ED Medications  ondansetron (ZOFRAN) injection 4 mg ( Intravenous MAR Unhold 11/21/18 0001)  alum & mag hydroxide-simeth (MAALOX/MYLANTA) 200-200-20 MG/5ML suspension 15-30 mL ( Oral MAR Unhold 11/21/18 0001)  pantoprazole (PROTONIX) EC tablet 40 mg (40 mg Oral Given 11/21/18 0804)  labetalol (NORMODYNE,TRANDATE) injection 10 mg ( Intravenous MAR Unhold 11/21/18 0001)  hydrALAZINE  (APRESOLINE) injection 5 mg ( Intravenous MAR Unhold 11/21/18 0001)  metoprolol tartrate (LOPRESSOR) injection 2-5 mg ( Intravenous MAR Unhold 11/21/18 0001)  guaiFENesin-dextromethorphan (ROBITUSSIN DM) 100-10 MG/5ML syrup 15 mL ( Oral MAR Unhold 11/21/18 0001)  phenol (CHLORASEPTIC) mouth spray 1 spray ( Mouth/Throat MAR Unhold 11/21/18 0001)  sodium chloride flush (NS) 0.9 % injection 3 mL (3 mLs Intravenous Given 11/21/18 0811)  sodium chloride flush (NS) 0.9 % injection 3 mL ( Intravenous MAR Unhold 11/21/18 0001)  0.9 %  sodium chloride infusion ( Intravenous MAR Unhold 11/21/18 0001)  acetaminophen (TYLENOL) tablet 325-650 mg (650 mg Oral Given 11/21/18 1230)    Or  acetaminophen (TYLENOL) suppository 325-650 mg ( Rectal See Alternative 11/21/18 1230)  oxyCODONE (Oxy IR/ROXICODONE) immediate release tablet 5-10 mg (10 mg Oral Given 11/21/18 1229)  morphine 2 MG/ML injection 2 mg (2 mg Intravenous Given 11/21/18 0009)  docusate sodium (COLACE) capsule 100 mg (100 mg Oral Not Given 11/21/18 0810)  insulin aspart (novoLOG) injection 0-15 Units (5 Units Subcutaneous Given 11/21/18 0801)  albuterol (PROVENTIL) (2.5 MG/3ML) 0.083% nebulizer solution 2.5 mg ( Inhalation MAR Unhold 11/21/18 0001)  feeding supplement (PRO-STAT SUGAR FREE 64)  liquid 30 mL (30 mLs Oral Given 11/21/18 0802)  antiseptic oral rinse (BIOTENE) solution 10-15 mL (10 mLs Mouth Rinse Not Given 11/21/18 0810)  Vitamin D (Ergocalciferol) (DRISDOL) capsule 50,000 Units ( Oral MAR Unhold 11/21/18 0001)  DULoxetine (CYMBALTA) DR capsule 60 mg (60 mg Oral Given 11/21/18 0804)  mometasone-formoterol (DULERA) 200-5 MCG/ACT inhaler 2 puff (2 puffs Inhalation Given 11/21/18 0718)  gabapentin (NEURONTIN) tablet 600 mg (600 mg Oral Given 11/21/18 0803)  insulin glargine (LANTUS) injection 22 Units ( Subcutaneous MAR Unhold 11/21/18 0001)  ipratropium-albuterol (DUONEB) 0.5-2.5 (3) MG/3ML nebulizer solution 3 mL ( Nebulization MAR Unhold 11/21/18 0001)  lisinopril  (PRINIVIL,ZESTRIL) tablet 30 mg (30 mg Oral Given 11/21/18 1229)  magnesium hydroxide (MILK OF MAGNESIA) suspension 30 mL ( Oral MAR Unhold 11/21/18 0001)  Melatonin TABS 3 mg ( Oral MAR Unhold 11/21/18 0001)  metFORMIN (GLUCOPHAGE) tablet 1,000 mg (1,000 mg Oral Given 11/21/18 0804)  miconazole (MICOTIN) 2 % powder 1 application ( Topical MAR Unhold 11/21/18 0001)  polyethylene glycol (MIRALAX / GLYCOLAX) packet 17 g (17 g Oral Given 11/21/18 0800)  ramelteon (ROZEREM) tablet 8 mg ( Oral MAR Unhold 11/21/18 0001)  rosuvastatin (CRESTOR) tablet 20 mg ( Oral MAR Unhold 11/21/18 0001)  traZODone (DESYREL) tablet 50 mg ( Oral MAR Unhold 11/21/18 0001)  fentaNYL (SUBLIMAZE) 100 MCG/2ML injection (  Not Given 11/20/18 2230)  diphenhydrAMINE (BENADRYL) capsule 50 mg (50 mg Oral Not Given 11/21/18 0151)    And  acetaminophen (TYLENOL) tablet 1,000 mg (1,000 mg Oral Not Given 11/21/18 0152)  insulin aspart (novoLOG) injection 0-9 Units (0 Units Subcutaneous Not Given 11/21/18 0811)  rOPINIRole (REQUIP) tablet 1 mg (1 mg Oral Given 11/21/18 0804)  MEDLINE mouth rinse (has no administration in time range)  0.9 %  sodium chloride infusion (Manually program via Guardrails IV Fluids) (has no administration in time range)  silver nitrate applicators applicator 1 Stick (1 Stick Topical Given 11/20/18 1432)  ondansetron (ZOFRAN-ODT) disintegrating tablet 4 mg (4 mg Oral Given 11/20/18 1541)  HYDROcodone-acetaminophen (NORCO/VICODIN) 5-325 MG per tablet 1 tablet (1 tablet Oral Given 11/20/18 1541)  HYDROcodone-acetaminophen (NORCO/VICODIN) 5-325 MG per tablet 1 tablet (1 tablet Oral Given 11/20/18 1912)  furosemide (LASIX) injection 20 mg (20 mg Intravenous Given 11/21/18 1229)     Initial Impression / Assessment and Plan / ED Course  I have reviewed the triage vital signs and the nursing notes.  Pertinent labs & imaging results that were available during my care of the patient were reviewed by me and considered in my medical decision making  (see chart for details).       Bleeding controlled with silver nitrate and combat gauze.  H/h and INR are pending.  Pt will be signed out to Dr. Ranae Palms at shift change.   Final Clinical Impressions(s) / ED Diagnoses   Final diagnoses:  Bleeding from wound  Anemia, unspecified type    ED Discharge Orders    None       Jacalyn Lefevre, MD 11/21/18 1515

## 2018-11-20 NOTE — Discharge Instructions (Addendum)
Vascular surgery came and evaluated patient's wound, no active bleeding or surgical need at this time.  Changed patient over to wet-to-dry dressings with good control of bleeding.  Can continue with this and discuss tomorrow with [facility] doctor regarding wound VAC.  Discontinue Lovenox as patient's INR is now therapeutic on Coumadin.

## 2018-11-20 NOTE — ED Triage Notes (Signed)
Pt arrives to ED from [facility] with complaints of bledding from her left lower leg. Pt had an embolectomy in that leg two weeks ago and had wound vac placed. Pt was her in the ED last night and sent back to [place]. Staff took wound vac off yesterday at [employer]. Staff at [facility] stated that they have had to change the dressing on the wound three times since 1000. On arrival the dressing is saturated and actively bleeding.

## 2018-11-20 NOTE — Anesthesia Procedure Notes (Signed)
Procedure Name: LMA Insertion Date/Time: 11/20/2018 9:52 PM Performed by: Melina Schools, CRNA Preoxygenation: Pre-oxygenation with 100% oxygen Induction Type: IV induction Ventilation: Mask ventilation without difficulty LMA: LMA inserted LMA Size: 4.0 Number of attempts: 1 Placement Confirmation: positive ETCO2 and breath sounds checked- equal and bilateral Tube secured with: Tape Dental Injury: Teeth and Oropharynx as per pre-operative assessment

## 2018-11-20 NOTE — ED Provider Notes (Signed)
Signout from Dr. Particia Nearing pending hemoglobin and INR check. Patient hemoglobin is dropped from 9.3-7.8 over the last 24 hours.  INR is therapeutic.  She continues to have oozing from the surgical wound on her leg.  She remains hemodynamically stable.  Discussed with Dr. Darrick Penna who will see patient in the emergency department.  Internal medicine service will admit.  Full set of labs have been sent including type and screen.   Loren Racer, MD 11/20/18 Serena Croissant

## 2018-11-20 NOTE — ED Notes (Addendum)
RN informed Pt can receive 2 visitors 

## 2018-11-20 NOTE — Anesthesia Procedure Notes (Signed)
Procedure Name: MAC Date/Time: 11/20/2018 9:17 PM Performed by: Claris Che, CRNA Pre-anesthesia Checklist: Patient identified, Emergency Drugs available, Suction available, Patient being monitored and Timeout performed Oxygen Delivery Method: Simple face mask

## 2018-11-20 NOTE — ED Notes (Signed)
PER [place], pt to [facility] for OR. Consent to be completed.

## 2018-11-20 NOTE — H&P (Signed)
[facility]  H&P  HPI: 78 y/o F s/p left leg embolectomy and fasciotomy about 2 weeks ago.  She has had bleeding from left leg fasciotomy site.  Was seen in ER yesterday for similar problem VAC dc'd.  Continues to have oozing from fasciotomy.  INR today 2.4.  She is on Lovenox and warfarin.  She is in a SNF.  Not sure when she got last lovenox injection.  Past Medical History:  Diagnosis Date  . Arthritis   . COPD (chronic obstructive pulmonary disease) (HCC)   . Diabetes mellitus without complication (HCC)   . Hypertension    Past Surgical History:  Procedure Laterality Date  . CATARACT EXTRACTION Right 11/2017  . EMBOLECTOMY Left 11/05/2018   Procedure: EMBOLECTOMY LEFT POPLITEAL ARTERY;  Surgeon: Sherren Kerns, MD;  Location: [facility] OR;  Service: Vascular;  Laterality: Left;  . FASCIOTOMY Left 11/05/2018   Procedure: Four compartment Fasciotomy left lower leg;  Surgeon: Sherren Kerns, MD;  Location: [facility] OR;  Service: Vascular;  Laterality: Left;  . PATCH ANGIOPLASTY Left 11/05/2018   Procedure: Patch Angioplasty LEFT POPLITEAL ARTERY;  Surgeon: Sherren Kerns, MD;  Location: [facility] OR;  Service: Vascular;  Laterality: Left;    FH:  Non-Contributory  Social HX Social History   Tobacco Use  . Smoking status: Passive Smoke Exposure - Never Smoker  . Smokeless tobacco: Never Used  Substance Use Topics  . Alcohol use: No    Frequency: Never  . Drug use: No    Allergies Allergies  Allergen Reactions  . Other Diarrhea, Nausea And Vomiting, Anxiety and Other (See Comments)    Unknown pain medication: caused sweating, nervousness, and "Some new pain med given at [facility], but I don't know what it's called." Patient states "it knocked me out and I got sweaty and they gave me benadryl."     Medications No current facility-administered medications for this encounter.    Current Outpatient Medications  Medication Sig Dispense Refill  . acetaminophen  (TYLENOL) 650 MG CR tablet Take 650 mg by mouth See admin instructions. Take 650 mg by mouth two times a day and an additional 650 mg at midday as needed for breakthrough pain    . albuterol (PROVENTIL HFA) 108 (90 Base) MCG/ACT inhaler Inhale 2 puffs into the lungs every 6 (six) hours as needed for shortness of breath (or coughing).     . Amino Acids-Protein Hydrolys (FEEDING SUPPLEMENT, PRO-STAT SUGAR FREE 64,) LIQD Take 30 mLs by mouth 3 (three) times daily.    Marland Kitchen antiseptic oral rinse (BIOTENE) LIQD 10-15 mLs by Mouth Rinse route 6 (six) times daily.    . chlorhexidine (PERIDEX) 0.12 % solution 5 mLs See admin instructions. Brush 1 teaspoonful (5 mls) of solution onto teeth and gums after evening mouth care/SPIT OUT EXCESS    . Cholecalciferol (VITAMIN D3) 1.25 MG (50000 UT) CAPS Take 50,000 Units by mouth every Sunday.    Marland Kitchen Dextran 70-Hypromellose (ARTIFICIAL TEARS PF OP) Place 1 drop into both eyes 2 (two) times daily.    . diphenhydramine-acetaminophen (TYLENOL PM) 25-500 MG TABS tablet Take 2 tablets by mouth at bedtime.     . DULoxetine (CYMBALTA) 60 MG capsule Take 60 mg by mouth daily.    Marland Kitchen enoxaparin (LOVENOX) 100 MG/ML injection Inject 0.9 mLs (90 mg total) into the skin every 12 (twelve) hours. 54 mL 0  . Fluticasone-Salmeterol (ADVAIR DISKUS) 250-50 MCG/DOSE AEPB Inhale 1 puff into the lungs 2 (two)  times daily.    Marland Kitchen gabapentin (NEURONTIN) 600 MG tablet Take 600 mg by mouth 2 (two) times daily.     . insulin aspart (NOVOLOG FLEXPEN) 100 UNIT/ML FlexPen Inject 10 Units into the skin once as needed for up to 1 day (for a BGL of 504). (Patient taking differently: Inject 10 Units into the skin See admin instructions. Inject 2-10 units into the skin 3 times a day before meals, per sliding scale: BGL 151-200 = 2 units; 201-250 = 4 units; 251-300 = 6 units; 301-350 = 8 units, 351-400 = 10 units; >400, call MD) 15 mL 11  . Insulin Glargine (BASAGLAR KWIKPEN) 100 UNIT/ML SOPN Inject 22 Units into  the skin at bedtime.     Marland Kitchen ipratropium-albuterol (DUONEB) 0.5-2.5 (3) MG/3ML SOLN Take 3 mLs by nebulization every 4 (four) hours as needed. (Patient taking differently: Take 3 mLs by nebulization every 4 (four) hours as needed (for shortness of breath). ) 3 mL 0  . lisinopril (PRINIVIL,ZESTRIL) 30 MG tablet Take 30 mg by mouth See admin instructions. Take 30 mg by mouth once a day and hold if Systolic reading is <100    . magnesium hydroxide (MILK OF MAGNESIA) 400 MG/5ML suspension Take 30 mLs by mouth daily as needed for mild constipation.    . Melatonin 10 MG CAPS Take 10 mg by mouth at bedtime.    . Menthol, Topical Analgesic, (BIOFREEZE EX) Apply 1 application topically See admin instructions. Apply to affected areas 2 times a day for pain    . metFORMIN (GLUCOPHAGE) 1000 MG tablet Take 1,000 mg by mouth 2 (two) times daily with a meal.    . miconazole (MICRO GUARD) 2 % powder Apply 1 application topically See admin instructions. Apply to abdominal folds and perineum 2 times a day    . mineral oil-hydrophilic petrolatum (AQUAPHOR) ointment Apply 1 application topically See admin instructions. Apply to dry skin daily and after bathing    . oxyCODONE 10 MG TABS Take 1 tablet (10 mg total) by mouth every 4 (four) hours as needed for severe pain. (Patient taking differently: Take 10 mg by mouth every 6 (six) hours as needed (for pain). ) 30 tablet 0  . polyethylene glycol powder (GLYCOLAX/MIRALAX) powder Take 17 g by mouth daily.    . ramelteon (ROZEREM) 8 MG tablet Take 8 mg by mouth at bedtime.    Marland Kitchen rOPINIRole (REQUIP XL) 2 MG 24 hr tablet Take 2 mg by mouth at bedtime.    . rosuvastatin (CRESTOR) 20 MG tablet Take 20 mg by mouth at bedtime.    . sennosides-docusate sodium (SENOKOT-S) 8.6-50 MG tablet Take 2 tablets by mouth at bedtime.    . traZODone (DESYREL) 50 MG tablet Take 50 mg by mouth at bedtime.     . Turmeric 500 MG CAPS Take 500 mg by mouth 2 (two) times daily.    Marland Kitchen warfarin  (COUMADIN) 7.5 MG tablet Take 1 tablet (7.5 mg total) by mouth daily. 15 tablet 0    Labs  CBC    Component Value Date/Time   WBC 6.3 11/20/2018 1907   RBC 2.54 (L) 11/20/2018 1907   HGB 7.5 (L) 11/20/2018 1907   HCT 24.5 (L) 11/20/2018 1907   PLT 212 11/20/2018 1907   MCV 96.5 11/20/2018 1907   MCH 29.5 11/20/2018 1907   MCHC 30.6 11/20/2018 1907   RDW 12.9 11/20/2018 1907   LYMPHSABS 2.8 11/20/2018 1907   MONOABS 0.5 11/20/2018 1907   EOSABS 0.5  11/20/2018 1907   BASOSABS 0.1 11/20/2018 1907   BMET    Component Value Date/Time   NA 138 11/20/2018 1907   K 5.3 (H) 11/20/2018 1907   CL 105 11/20/2018 1907   CO2 26 11/20/2018 1907   GLUCOSE 180 (H) 11/20/2018 1907   BUN 28 (H) 11/20/2018 1907   CREATININE 1.05 (H) 11/20/2018 1907   CALCIUM 8.0 (L) 11/20/2018 1907   GFRNONAA 52 (L) 11/20/2018 1907   GFRAA >60 11/20/2018 1907     PHYSICAL EXAM  Vitals:   11/20/18 1940 11/20/18 2015  BP: (!) 107/50 (!) 102/48  Pulse: 95   Resp: 17 18  Temp:    SpO2: 95% 93%    General:  WDWN in NAD HENT: WNL Eyes: Pupils equal Pulmonary: normal non-labored breathing , without Rales, rhonchi,  wheezing Cardiac: RRR, Vascular Exam/Pulses:  2+ AT pulse left foot warm oozing from inferior aspect of lateral fasciotomy wound left leg,  Medial incision clean and intact.  Pt unable to tolerate direct pressure on area of bleeding Neuro A&O x 3; good sensation; motion in all extremities some mild left foot drop ? Secondary to pain  Impression:  Bleeding from left leg fasciotomy with coagulapathy secondary to warfarin lovenox  Plan: To OR for I and D and cauterize source of bleeding  Will plan to stop all anticoagulation for 48 hours to get good hemostasis  Fabienne Bruns @TODAY @ 8:25 PM

## 2018-11-20 NOTE — ED Notes (Signed)
Pts wound had started to bleed through the dressing applied earlier. This RN reapplied combat gauze and re-wrapped area. MD [facility] notified.

## 2018-11-20 NOTE — Anesthesia Preprocedure Evaluation (Addendum)
Anesthesia Evaluation  Patient identified by MRN, date of birth, ID band Patient awake    Reviewed: Allergy & Precautions, NPO status , Patient's Chart, lab work & pertinent test results  Airway Mallampati: I  TM Distance: >3 FB Neck ROM: Full    Dental  (+) Partial Upper, Dental Advisory Given   Pulmonary COPD,  COPD inhaler,    breath sounds clear to auscultation       Cardiovascular hypertension, Pt. on medications + Peripheral Vascular Disease   Rhythm:Regular Rate:Normal     Neuro/Psych negative neurological ROS  negative psych ROS   GI/Hepatic   Endo/Other  diabetes, Type 2, Insulin Dependent, Oral Hypoglycemic Agents  Renal/GU Renal InsufficiencyRenal disease     Musculoskeletal  (+) Arthritis ,   Abdominal (+) + obese,   Peds  Hematology negative hematology ROS (+)   Anesthesia Other Findings   Reproductive/Obstetrics                            Lab Results  Component Value Date   WBC 6.3 11/20/2018   HGB 7.5 (L) 11/20/2018   HCT 24.5 (L) 11/20/2018   MCV 96.5 11/20/2018   PLT 212 11/20/2018   Lab Results  Component Value Date   CREATININE 1.05 (H) 11/20/2018   BUN 28 (H) 11/20/2018   NA 138 11/20/2018   K 5.3 (H) 11/20/2018   CL 105 11/20/2018   CO2 26 11/20/2018   Lab Results  Component Value Date   INR 2.4 (H) 11/20/2018   INR 2.1 (H) 11/19/2018   INR 1.5 (H) 11/14/2018   Echo 10/2018:  1. The left ventricle has normal systolic function with an ejection fraction of 60-65%. The cavity size was normal. There is mildly increased left ventricular wall thickness. Left ventricular diastolic Doppler parameters are consistent with impaired  relaxation Indeterminent filling pressures The E/e' is 8-15. No evidence of left ventricular regional wall motion abnormalities.  2. The right ventricle has normal systolic function. The cavity was normal. There is no increase in right  ventricular wall thickness.  3. The mitral valve is normal in structure.  4. The tricuspid valve is normal in structure.  5. The aortic valve is normal in structure.  6. The aortic root and ascending aorta are normal in size and structure.  7. No evidence of left ventricular regional wall motion abnormalities.  8. Right atrial pressure is estimated at 3 mmHg.  9. The interatrial septum was not well visualized.  EKG: normal sinus rhythm.  Anesthesia Physical Anesthesia Plan  ASA: III and emergent  Anesthesia Plan: MAC   Post-op Pain Management:    Induction: Intravenous  PONV Risk Score and Plan: 3 and Propofol infusion, Ondansetron and Treatment may vary due to age or medical condition  Airway Management Planned: Simple Face Mask and Natural Airway  Additional Equipment: None  Intra-op Plan:   Post-operative Plan:   Informed Consent: I have reviewed the patients History and Physical, chart, labs and discussed the procedure including the risks, benefits and alternatives for the proposed anesthesia with the patient or authorized representative who has indicated his/her understanding and acceptance.       Plan Discussed with: CRNA  Anesthesia Plan Comments:        Anesthesia Quick Evaluation

## 2018-11-20 NOTE — Op Note (Addendum)
Procedure: Exploration left leg fasciotomy control of hemorrhage  Preoperative diagnosis: Bleeding from left leg fasciotomy  Postoperative diagnosis: Same  Anesthesia: General  Assistant: Nurse  Operative details: After obtaining informed consent, the patient was taken the operating.  The patient placed in supine position operating table.  Sterile prep and drape was performed the left leg.  Initially we tried sedation with some local anesthesia over the fasciotomy but the patient did not tolerate this so the general anesthetic was commenced with a laryngeal mask.  There is generalized oozing from muscle tissue.  The muscle was not very contractile with cautery.  Several areas of muscle tissue were cauterized to control bleeding.  The wound was thoroughly irrigated normal saline solution.  A normal saline wet-to-dry dressing was applied.  Sterile Ace wrap was placed over this.  Patient tolerated procedure well and there were no complications.  The instrument sponge needle count was correct in the case.  Patient taken the recovery room stable condition.  Fabienne Bruns, MD [facility] [place] Office: [phone] Pager: [phone]

## 2018-11-20 NOTE — ED Notes (Signed)
Per Dr. Darrick Penna patient to go to the OR for left leg fasciotomy.

## 2018-11-20 NOTE — Transfer of Care (Signed)
Immediate Anesthesia Transfer of Care Note  Patient: Gabriella [name]  Procedure(s) Performed: IRRIGATION AND DEBRIDEMENT LEFT LOWER EXTREMITY (Left )  Patient Location: PACU  Anesthesia Type:General  Level of Consciousness: awake, alert  and patient cooperative  Airway & Oxygen Therapy: Patient Spontanous Breathing and Patient connected to nasal cannula oxygen  Post-op Assessment: Report given to RN, Post -op Vital signs reviewed and stable and Patient moving all extremities X 4  Post vital signs: Reviewed and stable  Last Vitals:  Vitals Value Taken Time  BP 128/53 11/20/2018 10:15 PM  Temp    Pulse 91 11/20/2018 10:22 PM  Resp 17 11/20/2018 10:24 PM  SpO2 100 % 11/20/2018 10:22 PM  Vitals shown include unvalidated device data.  Last Pain:  Vitals:   11/20/18 2014  TempSrc:   PainSc: 10-Worst pain ever      Patients Stated Pain Goal: 0 (11/20/18 1427)  Complications: No apparent anesthesia complications

## 2018-11-20 NOTE — ED Notes (Signed)
Pt discharged from ED; instructions provided; Pt encouraged to return to ED if symptoms worsen and to f/u with PCP; Pt verbalized understanding of all instructions 

## 2018-11-20 NOTE — ED Notes (Signed)
Dr. Darrick Penna at bedside. Dressing being removed at this time.

## 2018-11-21 ENCOUNTER — Encounter (HOSPITAL_COMMUNITY): Payer: Self-pay | Admitting: Vascular Surgery

## 2018-11-21 ENCOUNTER — Telehealth (HOSPITAL_COMMUNITY): Payer: Self-pay | Admitting: *Deleted

## 2018-11-21 LAB — GLUCOSE, CAPILLARY
Glucose-Capillary: 202 mg/dL — ABNORMAL HIGH (ref 70–99)
Glucose-Capillary: 208 mg/dL — ABNORMAL HIGH (ref 70–99)
Glucose-Capillary: 150 mg/dL — ABNORMAL HIGH (ref 70–99)

## 2018-11-21 LAB — MRSA PCR SCREENING: MRSA by PCR: NEGATIVE

## 2018-11-21 LAB — COMPREHENSIVE METABOLIC PANEL
ALT: 15 U/L (ref 0–44)
AST: 17 U/L (ref 15–41)
Albumin: 2.2 g/dL — ABNORMAL LOW (ref 3.5–5.0)
Alkaline Phosphatase: 48 U/L (ref 38–126)
Anion gap: 8 (ref 5–15)
BUN: 25 mg/dL — ABNORMAL HIGH (ref 8–23)
CO2: 25 mmol/L (ref 22–32)
Calcium: 7.5 mg/dL — ABNORMAL LOW (ref 8.9–10.3)
Chloride: 106 mmol/L (ref 98–111)
Creatinine, Ser: 1.04 mg/dL — ABNORMAL HIGH (ref 0.44–1.00)
GFR calc Af Amer: >60 mL/min (ref >60)
GFR calc non Af Amer: 53 mL/min — ABNORMAL LOW (ref >60)
Glucose, Bld: 142 mg/dL — ABNORMAL HIGH (ref 70–99)
POTASSIUM: 5.1 mmol/L (ref 3.5–5.1)
SODIUM: 139 mmol/L (ref 135–145)
Total Bilirubin: 0.5 mg/dL (ref 0.3–1.2)
Total Protein: 5.4 g/dL — ABNORMAL LOW (ref 6.5–8.1)

## 2018-11-21 LAB — CBC
HCT: 22.4 % — ABNORMAL LOW (ref 36.0–46.0)
Hemoglobin: 7.1 g/dL — ABNORMAL LOW (ref 12.0–15.0)
MCH: 29.8 pg (ref 26.0–34.0)
MCHC: 31.7 g/dL (ref 30.0–36.0)
MCV: 94.1 fL (ref 80.0–100.0)
Platelets: 194 10*3/uL (ref 150–400)
RBC: 2.38 MIL/uL — ABNORMAL LOW (ref 3.87–5.11)
RDW: 12.9 % (ref 11.5–15.5)
WBC: 4.7 10*3/uL (ref 4.0–10.5)
nRBC: 0 % (ref 0.0–0.2)

## 2018-11-21 LAB — PROTIME-INR
INR: 2.4 — ABNORMAL HIGH (ref 0.8–1.2)
Prothrombin Time: 25.7 seconds — ABNORMAL HIGH (ref 11.4–15.2)

## 2018-11-21 LAB — PREPARE RBC (CROSSMATCH)

## 2018-11-21 MED ORDER — INSULIN ASPART 100 UNIT/ML ~~LOC~~ SOLN
0.0000 [IU] | Freq: Three times a day (TID) | SUBCUTANEOUS | Status: DC
  Administered 2018-11-22: 2 [IU] via SUBCUTANEOUS
  Administered 2018-11-24: 1 [IU] via SUBCUTANEOUS

## 2018-11-21 MED ORDER — ACETAMINOPHEN 500 MG PO TABS
1000.0000 mg | ORAL_TABLET | Freq: Every day | ORAL | Status: DC
  Administered 2018-11-21 – 2018-11-30 (×10): 1000 mg via ORAL
  Filled 2018-11-21 (×11): qty 2

## 2018-11-21 MED ORDER — SODIUM CHLORIDE 0.9% IV SOLUTION: Freq: Once | INTRAVENOUS | Status: DC

## 2018-11-21 MED ORDER — FUROSEMIDE 10 MG/ML IJ SOLN
20.0000 mg | Freq: Once | INTRAMUSCULAR | Status: AC
  Administered 2018-11-21: 20 mg via INTRAVENOUS
  Filled 2018-11-21: qty 2

## 2018-11-21 MED ORDER — FUROSEMIDE 10 MG/ML IJ SOLN
INTRAMUSCULAR | Status: AC
  Filled 2018-11-21: qty 2

## 2018-11-21 MED ORDER — ROPINIROLE HCL 1 MG PO TABS
1.0000 mg | ORAL_TABLET | Freq: Two times a day (BID) | ORAL | Status: DC
  Administered 2018-11-21 – 2018-12-05 (×29): 1 mg via ORAL
  Filled 2018-11-21 (×29): qty 1

## 2018-11-21 MED ORDER — ORAL CARE MOUTH RINSE
15.0000 mL | Freq: Two times a day (BID) | OROMUCOSAL | Status: DC
  Administered 2018-11-22 – 2018-12-03 (×13): 15 mL via OROMUCOSAL

## 2018-11-21 MED ORDER — DIPHENHYDRAMINE HCL 25 MG PO CAPS
50.0000 mg | ORAL_CAPSULE | Freq: Every day | ORAL | Status: DC
  Administered 2018-11-21 – 2018-11-30 (×10): 50 mg via ORAL
  Filled 2018-11-21 (×11): qty 2

## 2018-11-21 NOTE — Clinical Social Work Note (Signed)
Patient is a short-term rehab resident at [facility]. Please place PT and OT orders when appropriate to evaluate for return one stable for discharge.  Charlynn Court, CSW [phone]

## 2018-11-21 NOTE — Progress Notes (Addendum)
  Progress Note    11/21/2018 9:26 AM 1 Day Post-Op  Subjective:  C/o pain   Afebrile HR 70's-100's  80's-110's systolic 99% 2LO2NC  Vitals:   11/21/18 0717 11/21/18 0906  BP:  (!) 89/57  Pulse: 98 100  Resp: 16 18  Temp:  98.1 F (36.7 C)  SpO2: 100% 99%    Physical Exam: Cardiac:  regular Lungs:  Non labored Incisions:  Bandage is clean and dry LLE Extremities:  +doppler signal left DP/PT;  Able to wiggle big toe; sensation in tact toes left foot.   CBC    Component Value Date/Time   WBC 4.7 11/21/2018 0224   RBC 2.38 (L) 11/21/2018 0224   HGB 7.1 (L) 11/21/2018 0224   HCT 22.4 (L) 11/21/2018 0224   PLT 194 11/21/2018 0224   MCV 94.1 11/21/2018 0224   MCH 29.8 11/21/2018 0224   MCHC 31.7 11/21/2018 0224   RDW 12.9 11/21/2018 0224   LYMPHSABS 2.8 11/20/2018 1907   MONOABS 0.5 11/20/2018 1907   EOSABS 0.5 11/20/2018 1907   BASOSABS 0.1 11/20/2018 1907    BMET    Component Value Date/Time   NA 139 11/21/2018 0032   K 5.1 11/21/2018 0032   CL 106 11/21/2018 0032   CO2 25 11/21/2018 0032   GLUCOSE 142 (H) 11/21/2018 0032   BUN 25 (H) 11/21/2018 0032   CREATININE 1.04 (H) 11/21/2018 0032   CALCIUM 7.5 (L) 11/21/2018 0032   GFRNONAA 53 (L) 11/21/2018 0032   GFRAA >60 11/21/2018 0032    INR    Component Value Date/Time   INR 2.4 (H) 11/21/2018 0224     Intake/Output Summary (Last 24 hours) at 11/21/2018 0926 Last data filed at 11/20/2018 2217 Gross per 24 hour  Intake 800 ml  Output 5 ml  Net 795 ml     Assessment:  78 y.o. female is s/p:  Exploration left leg fasciotomy control of hemorrhage  1 Day Post-Op  Plan: -pt's bandage is in place and dry; will take down in 24-48 hours -+doppler signal left DP/PT; able to wiggle big toe a little bit.  Sensation in tact. -acute blood loss anemia with hgb of 7.1 this am -  BP soft-will transfuse a couple units of PRBC's.  Check labs in am.  Continue to hold [facility] for now    Doreatha Massed,  PA-C [facility] [phone] 11/21/2018 9:26 AM  Agree with above.  Needs PT OT consults and probably foot drop splint No anticoagulation for 48 hr Change dressing 48 hr  Fabienne Bruns, MD [facility] [place] Office: [phone] Pager: [phone]

## 2018-11-21 NOTE — Telephone Encounter (Signed)
VM to remind pf appt

## 2018-11-21 NOTE — Anesthesia Postprocedure Evaluation (Signed)
Anesthesia Post Note  Patient: Gabriella [name]  Procedure(s) Performed: IRRIGATION AND DEBRIDEMENT LEFT LOWER EXTREMITY (Left )     Patient location during evaluation: PACU Anesthesia Type: General Level of consciousness: awake and alert Pain management: pain level controlled Vital Signs Assessment: post-procedure vital signs reviewed and stable Respiratory status: spontaneous breathing, nonlabored ventilation, respiratory function stable and patient connected to nasal cannula oxygen Cardiovascular status: blood pressure returned to baseline and stable Postop Assessment: no apparent nausea or vomiting Anesthetic complications: no    Last Vitals:  Vitals:   11/21/18 1604 11/21/18 1620  BP: 140/77 (!) 148/83  Pulse: 90   Resp: 20   Temp: 36.9 C 36.9 C  SpO2: 97% 97%    Last Pain:  Vitals:   11/21/18 1620  TempSrc: Oral  PainSc:                  Shelton Silvas

## 2018-11-22 ENCOUNTER — Encounter (HOSPITAL_COMMUNITY): Payer: Medicare (Managed Care)

## 2018-11-22 LAB — BPAM RBC
BLOOD PRODUCT EXPIRATION DATE: 202003312359
Blood Product Expiration Date: 202003242359
ISSUE DATE / TIME: 202003031238
ISSUE DATE / TIME: 202003031539
UNIT TYPE AND RH: 7300
Unit Type and Rh: 7300

## 2018-11-22 LAB — TYPE AND SCREEN
ABO/RH(D): B POS
Antibody Screen: NEGATIVE
UNIT DIVISION: 0
Unit division: 0

## 2018-11-22 LAB — GLUCOSE, CAPILLARY
Glucose-Capillary: 151 mg/dL — ABNORMAL HIGH (ref 70–99)
Glucose-Capillary: 180 mg/dL — ABNORMAL HIGH (ref 70–99)
Glucose-Capillary: 76 mg/dL (ref 70–99)
Glucose-Capillary: 102 mg/dL — ABNORMAL HIGH (ref 70–99)
Glucose-Capillary: 137 mg/dL — ABNORMAL HIGH (ref 70–99)

## 2018-11-22 LAB — CBC
HCT: 25.9 % — ABNORMAL LOW (ref 36.0–46.0)
Hemoglobin: 8.1 g/dL — ABNORMAL LOW (ref 12.0–15.0)
MCH: 27.9 pg (ref 26.0–34.0)
MCHC: 31.3 g/dL (ref 30.0–36.0)
MCV: 89.3 fL (ref 80.0–100.0)
Platelets: 204 10*3/uL (ref 150–400)
RBC: 2.9 MIL/uL — ABNORMAL LOW (ref 3.87–5.11)
RDW: 15.1 % (ref 11.5–15.5)
WBC: 7.9 10*3/uL (ref 4.0–10.5)
nRBC: 0 % (ref 0.0–0.2)

## 2018-11-22 LAB — PROTIME-INR
INR: 1.7 — ABNORMAL HIGH (ref 0.8–1.2)
PROTHROMBIN TIME: 19.5 s — AB (ref 11.4–15.2)

## 2018-11-22 NOTE — Evaluation (Signed)
Physical Therapy Evaluation Patient Details Name: Gabriella [name] MRN: [id number] DOB: Dec 10, 1940 Today's Date: 11/22/2018   History of Present Illness  Patient is a 78 year old woman with PMH of HTN, DM2, COPD, arthritis presents s/p IRRIGATION AND DEBRIDEMENT LEFT LOWER EXTREMITY.  Patient recently admitted for sepsis and s/p Left popliteal and tibial embolectomy, 4 compartment fasciotomy, vein patch angioplasty left popliteal artery 11/05/2018 and subsequently d/c to SNF.  Clinical Impression  Orders received for PT evaluation. Patient demonstrates deficits in functional mobility as indicated below. Will benefit from continued skilled PT to address deficits and maximize function. Will see as indicated and progress as tolerated.      Follow Up Recommendations SNF    Equipment Recommendations  None recommended by PT    Recommendations for Other Services       Precautions / Restrictions Precautions Precautions: Fall Restrictions Weight Bearing Restrictions: No      Mobility  Bed Mobility Overal bed mobility: Needs Assistance Bed Mobility: Supine to Sit     Supine to sit: HOB elevated;Min guard Sit to supine: HOB elevated   General bed mobility comments: In recliner upon arrival  Transfers Overall transfer level: Needs assistance Equipment used: Rolling walker (2 wheeled) Transfers: Sit to/from Stand Sit to Stand: Min guard Stand pivot transfers: Mod assist       General transfer comment: Min Guard A for safety and cues for hand placement  Ambulation/Gait Ambulation/Gait assistance: Min assist;+2 safety/equipment Gait Distance (Feet): 12 Feet Assistive device: Rolling walker (2 wheeled) Gait Pattern/deviations: Step-through pattern;Decreased stride length;Ataxic Gait velocity: decreased Gait velocity interpretation: <1.31 ft/sec, indicative of household ambulator General Gait Details: Assist for balance and support  Stairs            Wheelchair Mobility     Modified Rankin (Stroke Patients Only)       Balance Overall balance assessment: Needs assistance Sitting-balance support: Feet supported;No upper extremity supported Sitting balance-Leahy Scale: Good     Standing balance support: Bilateral upper extremity supported;During functional activity Standing balance-Leahy Scale: Poor(to fair) Standing balance comment: Rw for stability in standing                             Pertinent Vitals/Pain Pain Assessment: Faces Faces Pain Scale: Hurts little more Pain Location: LLE Pain Descriptors / Indicators: Aching;Sore Pain Intervention(s): Monitored during session    Home Living Family/patient expects to be discharged to:: Skilled nursing facility Living Arrangements: Other (Comment) Available Help at Discharge: Family;Personal care attendant;Other (Comment) Type of Home: Apartment Home Access: Level entry     Home Layout: One level Home Equipment: Walker - 4 wheels;Shower seat;Grab bars - tub/shower;Hand held shower head Additional Comments: [facility]; has an aide that comes to perform IADL aroud the house    Prior Function Level of Independence: Needs assistance   Gait / Transfers Assistance Needed: uses Rollator for community mobility  ADL's / Homemaking Assistance Needed: CNA assists with ADL's/IADL 3 days/week  Comments: has Meals on Wheels for days she is not at [employer]     Hand Dominance   Dominant Hand: Right    Extremity/Trunk Assessment   Upper Extremity Assessment Upper Extremity Assessment: Defer to OT evaluation    Lower Extremity Assessment Lower Extremity Assessment: Generalized weakness LLE Deficits / Details: s/p sx    Cervical / Trunk Assessment Cervical / Trunk Assessment: Other exceptions Cervical / Trunk Exceptions: obesity  Communication   Communication:  No difficulties  Cognition Arousal/Alertness: Awake/alert Behavior During Therapy: WFL for tasks  assessed/performed Overall Cognitive Status: Impaired/Different from baseline Area of Impairment: Memory;Awareness;Attention                   Current Attention Level: Selective Memory: Decreased short-term memory     Awareness: Emergent   General Comments: patient tangential at times during session, often confusing therapist identities after being provided with clarification of identities multiple times. Cues to direct to task performance could not complete some tasks without multiple cues to re engage in performance      General Comments      Exercises     Assessment/Plan    PT Assessment Patient needs continued PT services  PT Problem List Decreased strength;Decreased activity tolerance;Decreased balance;Decreased mobility;Decreased cognition;Decreased safety awareness;Pain       PT Treatment Interventions DME instruction;Gait training;Functional mobility training;Therapeutic activities;Therapeutic exercise;Balance training;Patient/family education    PT Goals (Current goals can be found in the Care Plan section)  Acute Rehab PT Goals Patient Stated Goal: get back home to go to [facility] PT Goal Formulation: With patient Time For Goal Achievement: 11/18/18 Potential to Achieve Goals: Fair    Frequency Min 3X/week   Barriers to discharge        Co-evaluation PT/OT/SLP Co-Evaluation/Treatment: Yes Reason for Co-Treatment: For patient/therapist safety PT goals addressed during session: Mobility/safety with mobility OT goals addressed during session: ADL's and self-care       AM-PAC PT "6 Clicks" Mobility  Outcome Measure Help needed turning from your back to your side while in a flat bed without using bedrails?: None Help needed moving from lying on your back to sitting on the side of a flat bed without using bedrails?: A Little Help needed moving to and from a bed to a chair (including a wheelchair)?: A Little Help needed standing up from a chair using your  arms (e.g., wheelchair or bedside chair)?: A Little Help needed to walk in hospital room?: A Little Help needed climbing 3-5 steps with a railing? : Total 6 Click Score: 17    End of Session Equipment Utilized During Treatment: Gait belt Activity Tolerance: Patient tolerated treatment well Patient left: in chair;with call bell/phone within reach;with chair alarm set Nurse Communication: Mobility status PT Visit Diagnosis: Unsteadiness on feet (R26.81);Muscle weakness (generalized) (M62.81);Difficulty in walking, not elsewhere classified (R26.2);Pain Pain - Right/Left: Left Pain - part of body: Leg    Time: 5003-7048 PT Time Calculation (min) (ACUTE ONLY): 18 min   Charges:   PT Evaluation $PT Eval Moderate Complexity: 1 Mod          Charlotte Crumb, PT DPT  Board Certified Neurologic Specialist Acute Rehabilitation Services Pager [phone] Office [phone]   Fabio Asa 11/22/2018, 12:51 PM

## 2018-11-22 NOTE — Progress Notes (Signed)
[facility]   Subjective  - feels ok   Objective (!) 110/55 83 97.9 F (36.6 C) (Oral) 16 97%  Intake/Output Summary (Last 24 hours) at 11/22/2018 0802 Last data filed at 11/22/2018 0349 Gross per 24 hour  Intake 760 ml  Output 1300 ml  Net -540 ml   Left foot a little more mobile Dressing dry 2+ AT pulse  Assessment/Planning: No further bleeding from fasciotomy Will change dressing and resume wound care tomorrow Acute blood loss anemia, trend for now, may need transfusion tomorrow but does not appear to be actively bleeding Most likely resume warfarin tomorrow  Needs to get out of bed and ambulate.  PT/OT ordered yesterday but not seen yet.  Gabriella [name] 11/22/2018 8:02 AM --  Laboratory Lab Results: Recent Labs    11/21/18 0224 11/22/18 0146  WBC 4.7 7.9  HGB 7.1* 8.1*  HCT 22.4* 25.9*  PLT 194 204   BMET Recent Labs    11/20/18 1907 11/21/18 0032  NA 138 139  K 5.3* 5.1  CL 105 106  CO2 26 25  GLUCOSE 180* 142*  BUN 28* 25*  CREATININE 1.05* 1.04*  CALCIUM 8.0* 7.5*    COAG Lab Results  Component Value Date   INR 1.7 (H) 11/22/2018   INR 2.4 (H) 11/21/2018   INR 2.4 (H) 11/20/2018   No results found for: PTT

## 2018-11-22 NOTE — NC FL2 (Signed)
[place] MEDICAID FL2 LEVEL OF CARE SCREENING TOOL     IDENTIFICATION  Patient Name: Gabriella [name] Birthdate: 04/07/1941 Sex: female Admission Date (Current Location): 11/20/2018  [facility] and [place] Number:  Producer, television/film/video and Address:  [facility], [address] [place], [place] [address]      Provider Number: [id number]  Attending Physician Name and Address:  Sherren Kerns, MD  Relative Name and Phone Number:       Current Level of Care: Hospital Recommended Level of Care: Skilled Nursing Facility Prior Approval Number:    Date Approved/Denied:   PASRR Number: [id number]  Discharge Plan: SNF    Current Diagnoses: Patient Active Problem List   Diagnosis Date Noted  . Bleeding 11/20/2018  . 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 Peripheral vascular disease (HCC) 11/05/2018  . AKI (acute kidney injury) (HCC)   . Diarrhea   . Hyperglycemia   . Gastroenteritis 11/02/2018  . Pressure injury of skin 12/04/2017  . Diabetic ketoacidosis (HCC) 12/03/2017  . Acute respiratory failure with hypoxia (HCC) 12/02/2017  . Influenza B 12/02/2017    Orientation RESPIRATION BLADDER Height & Weight     Self, Place  O2(Nasal Canula 2 L) Continent, External catheter Weight: 205 lb 14.6 oz (93.4 kg) Height:  5\' 3"  (160 cm)  BEHAVIORAL SYMPTOMS/MOOD NEUROLOGICAL BOWEL NUTRITION STATUS  (None)   Continent Diet(Heart healthy/carb modified.)  AMBULATORY STATUS COMMUNICATION OF NEEDS Skin   Limited Assist Verbally Surgical wounds, Bruising, Other (Comment)(Excoriated, MASD.)                       Personal Care Assistance Level of Assistance  Bathing, Feeding, Dressing Bathing Assistance: Maximum assistance Feeding assistance: Independent Dressing Assistance: Maximum assistance     Functional Limitations Info  Sight, Hearing, Speech Sight Info: Adequate Hearing Info: Adequate Speech Info: Adequate    SPECIAL  CARE FACTORS FREQUENCY  PT (By licensed PT), OT (By licensed OT)     PT Frequency: 5 x week OT Frequency: 5 x week            Contractures Contractures Info: Not present    Additional Factors Info  Code Status, Allergies Code Status Info: Full code Allergies Info: "Unknown pain medication."           Current Medications (11/22/2018):  This is the current hospital active medication list Current Facility-Administered Medications  Medication Dose Route Frequency Provider Last Rate Last Dose  . 0.9 %  sodium chloride infusion (Manually program via Guardrails IV Fluids)   Intravenous Once Dara Lords, PA-C   Stopped at 11/21/18 1957  . 0.9 %  sodium chloride infusion  250 mL Intravenous PRN Sherren Kerns, MD      . acetaminophen (TYLENOL) tablet 325-650 mg  325-650 mg Oral Q4H PRN Sherren Kerns, MD   325 mg at 11/22/18 0957   Or  . acetaminophen (TYLENOL) suppository 325-650 mg  325-650 mg Rectal Q4H PRN Sherren Kerns, MD      . diphenhydrAMINE (BENADRYL) capsule 50 mg  50 mg Oral QHS Sherren Kerns, MD   50 mg at 11/21/18 2153   And  . acetaminophen (TYLENOL) tablet 1,000 mg  1,000 mg Oral QHS Sherren Kerns, MD   1,000 mg at 11/21/18 2154  . albuterol (PROVENTIL) (2.5 MG/3ML) 0.083% nebulizer solution 2.5 mg  2.5 mg Inhalation Q6H PRN Sherren Kerns, MD      .  alum & mag hydroxide-simeth (MAALOX/MYLANTA) 200-200-20 MG/5ML suspension 15-30 mL  15-30 mL Oral Q2H PRN Sherren Kerns, MD      . antiseptic oral rinse (BIOTENE) solution 10-15 mL  10-15 mL Mouth Rinse 6 X Daily Sherren Kerns, MD   15 mL at 11/22/18 1253  . docusate sodium (COLACE) capsule 100 mg  100 mg Oral BID Sherren Kerns, MD   100 mg at 11/22/18 0959  . DULoxetine (CYMBALTA) DR capsule 60 mg  60 mg Oral Daily Sherren Kerns, MD   60 mg at 11/22/18 1610  . feeding supplement (PRO-STAT SUGAR FREE 64) liquid 30 mL  30 mL Oral TID Sherren Kerns, MD   30 mL at 11/21/18 1620  .  gabapentin (NEURONTIN) tablet 600 mg  600 mg Oral BID Sherren Kerns, MD   600 mg at 11/22/18 0959  . guaiFENesin-dextromethorphan (ROBITUSSIN DM) 100-10 MG/5ML syrup 15 mL  15 mL Oral Q4H PRN Sherren Kerns, MD      . hydrALAZINE (APRESOLINE) injection 5 mg  5 mg Intravenous Q20 Min PRN Fields, Janetta Hora, MD      . insulin aspart (novoLOG) injection 0-15 Units  0-15 Units Subcutaneous TID WC Sherren Kerns, MD   2 Units at 11/22/18 1251  . insulin aspart (novoLOG) injection 0-9 Units  0-9 Units Subcutaneous TID WC Sherren Kerns, MD   2 Units at 11/22/18 [phone]  . insulin glargine (LANTUS) injection 22 Units  22 Units Subcutaneous QHS Sherren Kerns, MD   22 Units at 11/21/18 2156  . ipratropium-albuterol (DUONEB) 0.5-2.5 (3) MG/3ML nebulizer solution 3 mL  3 mL Nebulization Q4H PRN Sherren Kerns, MD      . labetalol (NORMODYNE,TRANDATE) injection 10 mg  10 mg Intravenous Q10 min PRN Sherren Kerns, MD      . lisinopril (PRINIVIL,ZESTRIL) tablet 30 mg  30 mg Oral Daily Sherren Kerns, MD   30 mg at 11/22/18 [phone]  . magnesium hydroxide (MILK OF MAGNESIA) suspension 30 mL  30 mL Oral Daily PRN Sherren Kerns, MD      . MEDLINE mouth rinse  15 mL Mouth Rinse BID Sherren Kerns, MD   15 mL at 11/22/18 0959  . Melatonin TABS 3 mg  3 mg Oral QHS Sherren Kerns, MD   3 mg at 11/21/18 2152  . metFORMIN (GLUCOPHAGE) tablet 1,000 mg  1,000 mg Oral BID WC Sherren Kerns, MD   1,000 mg at 11/22/18 0959  . metoprolol tartrate (LOPRESSOR) injection 2-5 mg  2-5 mg Intravenous Q2H PRN Sherren Kerns, MD      . miconazole (MICOTIN) 2 % powder 1 application  1 application Topical BID Sherren Kerns, MD   1 application at 11/22/18 1000  . mometasone-formoterol (DULERA) 200-5 MCG/ACT inhaler 2 puff  2 puff Inhalation BID Sherren Kerns, MD   2 puff at 11/22/18 0744  . morphine 2 MG/ML injection 2 mg  2 mg Intravenous Q1H PRN Sherren Kerns, MD   2 mg at 11/21/18 0009  .  ondansetron (ZOFRAN) injection 4 mg  4 mg Intravenous Q6H PRN Sherren Kerns, MD      . oxyCODONE (Oxy IR/ROXICODONE) immediate release tablet 5-10 mg  5-10 mg Oral Q4H PRN Sherren Kerns, MD   5 mg at 11/22/18 1254  . pantoprazole (PROTONIX) EC tablet 40 mg  40 mg Oral Daily Sherren Kerns, MD   40 mg  at 11/22/18 0959  . phenol (CHLORASEPTIC) mouth spray 1 spray  1 spray Mouth/Throat PRN Fields, Janetta Hora, MD      . polyethylene glycol (MIRALAX / GLYCOLAX) packet 17 g  17 g Oral Daily Sherren Kerns, MD   17 g at 11/22/18 1914  . ramelteon (ROZEREM) tablet 8 mg  8 mg Oral QHS Sherren Kerns, MD   8 mg at 11/21/18 2134  . rOPINIRole (REQUIP) tablet 1 mg  1 mg Oral BID Sherren Kerns, MD   1 mg at 11/22/18 7829  . rosuvastatin (CRESTOR) tablet 20 mg  20 mg Oral QHS Sherren Kerns, MD   20 mg at 11/21/18 2154  . sodium chloride flush (NS) 0.9 % injection 3 mL  3 mL Intravenous Q12H Sherren Kerns, MD   3 mL at 11/22/18 1000  . sodium chloride flush (NS) 0.9 % injection 3 mL  3 mL Intravenous PRN Sherren Kerns, MD      . traZODone (DESYREL) tablet 50 mg  50 mg Oral QHS Sherren Kerns, MD   50 mg at 11/21/18 2153  . [START ON 11/26/2018] Vitamin D (Ergocalciferol) (DRISDOL) capsule 50,000 Units  50,000 Units Oral Q Teofilo Pod, MD         Discharge Medications: Please see discharge summary for a list of discharge medications.  Relevant Imaging Results:  Relevant Lab Results:   Additional Information SS#: [id number]  Margarito Liner, LCSW

## 2018-11-22 NOTE — Clinical Social Work Note (Signed)
Clinical Social Work Assessment  Patient Details  Name: Gabriella [name] MRN: [id number] Date of Birth: [birth year]  Date of referral:  11/22/18               Reason for consult:  Discharge Planning                Permission sought to share information with:  Facility Medical sales representative, Family Supports Permission granted to share information::  (Not fully oriented.)  Name::     Marlinda Mike  Agency::  [facility]  Relationship::  Daughter  Contact Information:  [phone]  Housing/Transportation Living arrangements for the past 2 months:  Skilled Nursing Facility, Single Family Home Source of Information:  Medical Team, Adult Children, Facility Patient Interpreter Needed:  None Criminal Activity/Legal Involvement Pertinent to Current Situation/Hospitalization:  No - Comment as needed Significant Relationships:  Adult Children Lives with:  Adult Children Do you feel safe going back to the place where you live?  Yes Need for family participation in patient care:  Yes (Comment)  Care giving concerns:  Patient is a short-term resident at [facility]   Social Worker assessment / plan:  Patient not fully oriented. CSW called daughter, introduced role, and explained that PT recommendations would be discussed. Patient's daughter confirmed she is a short-term rehab resident at [facility] and plan is to return for further rehab at discharge. Daughter stated [facility] is already working on a plan to bring her home. No further concerns. CSW encouraged patient's daughter to contact CSW as needed. CSW will continue to follow patient and her daughter for support and facilitate discharge back to [facility] once medically stable.  Employment status:  Retired Health and safety inspector:  Other (Comment Required)([facility]) PT Recommendations:  Skilled Nursing Facility Information / Referral to community resources:  Skilled Nursing Facility  Patient/Family's Response to care:  Patient not fully oriented.  Patient's daughter agreeable to return to SNF. Patient's daughter supportive and involved in patient's care. Patient's daughter appreciated social work intervention.  Patient/Family's Understanding of and Emotional Response to Diagnosis, Current Treatment, and Prognosis:  Patient not fully oriented. Patient's daughter has a good understanding of the reason for admission and her need for continued rehab before returning home. Patient's daughter appears happy with hospital care.  Emotional Assessment Appearance:  Appears stated age Attitude/Demeanor/Rapport:  Unable to Assess Affect (typically observed):  Unable to Assess Orientation:  Oriented to Self, Oriented to Place Alcohol / Substance use:  Never Used Psych involvement (Current and /or in the community):  No (Comment)  Discharge Needs  Concerns to be addressed:  Care Coordination Readmission within the last 30 days:  Yes Current discharge risk:  Cognitively Impaired, Dependent with Mobility Barriers to Discharge:  Continued Medical Work up   Margarito Liner, LCSW 11/22/2018, 4:16 PM

## 2018-11-22 NOTE — Evaluation (Signed)
Occupational Therapy Evaluation Patient Details Name: Gabriella [name] MRN: [id number] DOB: [birth year] Today's Date: 11/22/2018    History of Present Illness Patient is a 78 year old woman with PMH of HTN, DM2, COPD, arthritis presents s/p IRRIGATION AND DEBRIDEMENT LEFT LOWER EXTREMITY.  Patient recently admitted for sepsis and s/p Left popliteal and tibial embolectomy, 4 compartment fasciotomy, vein patch angioplasty left popliteal artery 11/05/2018 and subsequently d/c to SNF.   Clinical Impression   PTA Pt at SNF working with therapies, prior to that was involved in [facility] - getting aide 3x a week for assist with ADL/IADL. Pt is currently min A for transfers, max A to don socks EOB, required sitting for grooming tasks and presents with decreased cognition, activity tolerance, balance, and safety awareness. Pt will require skilled OT in the acute setting as well as afterwards at the SNF level to maximize safety and independence in ADL and functional transfers. Next session to focus on LB ADL access and continued transfers.     Follow Up Recommendations  SNF;Supervision/Assistance - 24 hour    Equipment Recommendations  Other (comment)    Recommendations for Other Services       Precautions / Restrictions Precautions Precautions: Fall Restrictions Weight Bearing Restrictions: No      Mobility Bed Mobility Overal bed mobility: Needs Assistance Bed Mobility: Supine to Sit     Supine to sit: HOB elevated;Min guard Sit to supine: Min assist   General bed mobility comments: assist with trunk elevation and assist to bring hips EOB  Transfers Overall transfer level: Needs assistance Equipment used: Rolling walker (2 wheeled) Transfers: Sit to/from Stand Sit to Stand: Min guard Stand pivot transfers: Mod assist       General transfer comment: Min Guard A for safety and cues for hand placement    Balance Overall balance assessment: Needs assistance Sitting-balance support: Feet  supported;No upper extremity supported Sitting balance-Leahy Scale: Good     Standing balance support: Bilateral upper extremity supported;During functional activity Standing balance-Leahy Scale: Poor(to fair) Standing balance comment: Rw for stability in standing                           ADL either performed or assessed with clinical judgement   ADL Overall ADL's : Needs assistance/impaired Eating/Feeding: Independent;Sitting   Grooming: Oral care;Wash/dry face;Set up;Sitting Grooming Details (indicate cue type and reason): Pt able to perform grooming sitting at sink - limited by pain Upper Body Bathing: Minimal assistance;Sitting   Lower Body Bathing: Maximal assistance;Sitting/lateral leans Lower Body Bathing Details (indicate cue type and reason): to wash down legs and peri area Upper Body Dressing : Set up;Sitting Upper Body Dressing Details (indicate cue type and reason): to don new hospital gown Lower Body Dressing: Maximal assistance;Sitting/lateral leans;+2 for physical assistance;+2 for safety/equipment Lower Body Dressing Details (indicate cue type and reason): to don socks, brief Toilet Transfer: Min guard;Ambulation;BSC Toilet Transfer Details (indicate cue type and reason): Min Guard A for safety Toileting- Clothing Manipulation and Hygiene: +2 for physical assistance;+2 for safety/equipment;Sit to/from stand;Minimal assistance Toileting - Clothing Manipulation Details (indicate cue type and reason): to manage peri care, and for pulling up underwear - pt dependent on BUE on RW     Functional mobility during ADLs: Min guard;Rolling walker General ADL Comments: confusion, decreased safety awareness     Vision Patient Visual Report: No change from baseline       Perception     Praxis  Pertinent Vitals/Pain Pain Assessment: Faces Faces Pain Scale: Hurts little more Pain Location: LLE Pain Descriptors / Indicators: Aching;Sore Pain  Intervention(s): Monitored during session;Repositioned     Hand Dominance Right   Extremity/Trunk Assessment Upper Extremity Assessment Upper Extremity Assessment: Generalized weakness   Lower Extremity Assessment Lower Extremity Assessment: Generalized weakness LLE Deficits / Details: s/p sx   Cervical / Trunk Assessment Cervical / Trunk Assessment: Other exceptions Cervical / Trunk Exceptions: obesity   Communication Communication Communication: No difficulties   Cognition Arousal/Alertness: Awake/alert Behavior During Therapy: WFL for tasks assessed/performed Overall Cognitive Status: Impaired/Different from baseline Area of Impairment: Memory;Awareness;Attention                   Current Attention Level: Selective Memory: Decreased short-term memory     Awareness: Emergent   General Comments: patient tangential at times during session, often confusing therapist identities after being provided with clarification of identities multiple times. Cues to direct to task performance could not complete some tasks without multiple cues to re engage in performance   General Comments       Exercises Exercises: (performed multiple sit<>stands and functional tasks in uprig)   Shoulder Instructions      Home Living Family/patient expects to be discharged to:: Skilled nursing facility Living Arrangements: Other (Comment)(goes to [facility]) Available Help at Discharge: Family;Personal care attendant;Other (Comment) Type of Home: Apartment Home Access: Level entry     Home Layout: One level     Bathroom Shower/Tub: Tub/shower unit         Home Equipment: Walker - 4 wheels;Shower seat;Grab bars - tub/shower;Hand held shower head   Additional Comments: Goes to [facility] 3 days/week; has an aide that comes to perform IADL aroud the house      Prior Functioning/Environment Level of Independence: Needs assistance  Gait / Transfers Assistance Needed: uses Rollator for  community mobility ADL's / Homemaking Assistance Needed: CNA assists with ADL's/IADL 3 days/week   Comments: has Meals on Wheels for days she is not at [employer]        OT Problem List: Decreased strength;Decreased range of motion;Decreased activity tolerance;Impaired balance (sitting and/or standing);Decreased knowledge of use of DME or AE;Obesity;Pain;Increased edema      OT Treatment/Interventions: Self-care/ADL training;Therapeutic exercise;Energy conservation;DME and/or AE instruction;Therapeutic activities;Patient/family education;Balance training    OT Goals(Current goals can be found in the care plan section) Acute Rehab OT Goals Patient Stated Goal: get back home to go to [facility] OT Goal Formulation: With patient Time For Goal Achievement: 12/06/18 Potential to Achieve Goals: Good ADL Goals Pt Will Perform Grooming: with supervision;standing Pt Will Perform Lower Body Bathing: with supervision;sitting/lateral leans Pt Will Perform Lower Body Dressing: with min assist;sit to/from stand Pt Will Transfer to Toilet: with supervision;ambulating Pt Will Perform Toileting - Clothing Manipulation and hygiene: with min assist;sit to/from stand  OT Frequency: Min 2X/week   Barriers to D/C: Decreased caregiver support  Pt is at home alone while daughter is at work       Co-evaluation PT/OT/SLP Co-Evaluation/Treatment: Yes Reason for Co-Treatment: For patient/therapist safety PT goals addressed during session: Mobility/safety with mobility OT goals addressed during session: ADL's and self-care      AM-PAC OT "6 Clicks" Daily Activity     Outcome Measure Help from another person eating meals?: None Help from another person taking care of personal grooming?: A Little Help from another person toileting, which includes using toliet, bedpan, or urinal?: A Lot Help from another person bathing (including washing, rinsing, drying)?:  A Lot Help from another person to put on and taking off  regular upper body clothing?: A Little Help from another person to put on and taking off regular lower body clothing?: A Lot 6 Click Score: 16   End of Session Equipment Utilized During Treatment: Rolling walker Nurse Communication: Mobility status  Activity Tolerance: Patient limited by fatigue;Patient limited by pain Patient left: with call bell/phone within reach;in chair;with chair alarm set  OT Visit Diagnosis: Unsteadiness on feet (R26.81);Other abnormalities of gait and mobility (R26.89);Pain;History of falling (Z91.81);Muscle weakness (generalized) (M62.81) Pain - Right/Left: Left Pain - part of body: Leg                Time: 1129-1203 OT Time Calculation (min): 34 min Charges:  OT General Charges $OT Visit: 1 Visit OT Evaluation $OT Eval Moderate Complexity: 1 Mod  Sherryl Manges OTR/L Acute Rehabilitation Services Pager: [phone] Office: [phone] Evern Bio Taran Haynesworth 11/22/2018, 1:57 PM

## 2018-11-23 ENCOUNTER — Encounter: Payer: Medicare (Managed Care) | Admitting: Vascular Surgery

## 2018-11-23 LAB — BASIC METABOLIC PANEL
Anion gap: 6 (ref 5–15)
BUN: 27 mg/dL — ABNORMAL HIGH (ref 8–23)
CO2: 28 mmol/L (ref 22–32)
Calcium: 8 mg/dL — ABNORMAL LOW (ref 8.9–10.3)
Chloride: 100 mmol/L (ref 98–111)
Creatinine, Ser: 1.03 mg/dL — ABNORMAL HIGH (ref 0.44–1.00)
GFR calc Af Amer: >60 mL/min (ref >60)
GFR calc non Af Amer: 53 mL/min — ABNORMAL LOW (ref >60)
Glucose, Bld: 108 mg/dL — ABNORMAL HIGH (ref 70–99)
Potassium: 4.6 mmol/L (ref 3.5–5.1)
Sodium: 134 mmol/L — ABNORMAL LOW (ref 135–145)

## 2018-11-23 LAB — GLUCOSE, CAPILLARY
GLUCOSE-CAPILLARY: 87 mg/dL (ref 70–99)
Glucose-Capillary: 144 mg/dL — ABNORMAL HIGH (ref 70–99)
Glucose-Capillary: 99 mg/dL (ref 70–99)
Glucose-Capillary: 159 mg/dL — ABNORMAL HIGH (ref 70–99)

## 2018-11-23 LAB — CBC
HEMATOCRIT: 25.5 % — AB (ref 36.0–46.0)
Hemoglobin: 7.9 g/dL — ABNORMAL LOW (ref 12.0–15.0)
MCH: 28.4 pg (ref 26.0–34.0)
MCHC: 31 g/dL (ref 30.0–36.0)
MCV: 91.7 fL (ref 80.0–100.0)
Platelets: 186 10*3/uL (ref 150–400)
RBC: 2.78 MIL/uL — ABNORMAL LOW (ref 3.87–5.11)
RDW: 14.6 % (ref 11.5–15.5)
WBC: 7.1 10*3/uL (ref 4.0–10.5)
nRBC: 0 % (ref 0.0–0.2)

## 2018-11-23 LAB — PROTIME-INR
INR: 1.3 — ABNORMAL HIGH (ref 0.8–1.2)
Prothrombin Time: 15.7 seconds — ABNORMAL HIGH (ref 11.4–15.2)

## 2018-11-23 MED ORDER — WARFARIN SODIUM 5 MG PO TABS
5.0000 mg | ORAL_TABLET | Freq: Every day | ORAL | Status: DC
  Administered 2018-11-23 – 2018-11-27 (×5): 5 mg via ORAL
  Filled 2018-11-23 (×5): qty 1

## 2018-11-23 MED ORDER — WARFARIN - PHYSICIAN DOSING INPATIENT
Freq: Every day | Status: DC
  Administered 2018-11-23 – 2018-11-27 (×5)

## 2018-11-23 NOTE — Plan of Care (Signed)
  Problem: Education: Goal: Knowledge of General Education information will improve Description Including pain rating scale, medication(s)/side effects and non-pharmacologic comfort measures Outcome: Progressing Note:  POC and pain management reviewed with pt.   

## 2018-11-23 NOTE — Progress Notes (Signed)
[facility] [place]  Subjective  - a  Little less pain   Objective (!) 126/57 83 98.5 F (36.9 C) (Oral) 16 98%  Intake/Output Summary (Last 24 hours) at 11/23/2018 0749 Last data filed at 11/22/2018 2020 Gross per 24 hour  Intake -  Output 500 ml  Net -500 ml   2+ AT pulse Fasciotomy wound clean granulating no real bleeding  Assessment/Planning: Start warfarin today 5 mg Start wound care hydrogel once daily SNF when INR 2  Fabienne Bruns 11/23/2018 7:49 AM --  Laboratory Lab Results: Recent Labs    11/22/18 0146 11/23/18 0316  WBC 7.9 7.1  HGB 8.1* 7.9*  HCT 25.9* 25.5*  PLT 204 186   BMET Recent Labs    11/21/18 0032 11/23/18 0316  NA 139 134*  K 5.1 4.6  CL 106 100  CO2 25 28  GLUCOSE 142* 108*  BUN 25* 27*  CREATININE 1.04* 1.03*  CALCIUM 7.5* 8.0*    COAG Lab Results  Component Value Date   INR 1.3 (H) 11/23/2018   INR 1.7 (H) 11/22/2018   INR 2.4 (H) 11/21/2018   No results found for: PTT

## 2018-11-23 NOTE — Care Management Important Message (Signed)
Important Message  Patient Details  Name: Gabriella [name] MRN: [id number] Date of Birth: 07/11/1941   Medicare Important Message Given:  Yes    Ankit Degregorio P Ayyan Sites 11/23/2018, 1:19 PM

## 2018-11-24 LAB — PROTIME-INR
INR: 1.2 (ref 0.8–1.2)
Prothrombin Time: 14.6 seconds (ref 11.4–15.2)

## 2018-11-24 LAB — CBC
HEMATOCRIT: 25.8 % — AB (ref 36.0–46.0)
Hemoglobin: 8.2 g/dL — ABNORMAL LOW (ref 12.0–15.0)
MCH: 29 pg (ref 26.0–34.0)
MCHC: 31.8 g/dL (ref 30.0–36.0)
MCV: 91.2 fL (ref 80.0–100.0)
Platelets: 212 10*3/uL (ref 150–400)
RBC: 2.83 MIL/uL — ABNORMAL LOW (ref 3.87–5.11)
RDW: 14 % (ref 11.5–15.5)
WBC: 7.8 10*3/uL (ref 4.0–10.5)
nRBC: 0 % (ref 0.0–0.2)

## 2018-11-24 LAB — GLUCOSE, CAPILLARY
Glucose-Capillary: 128 mg/dL — ABNORMAL HIGH (ref 70–99)
Glucose-Capillary: 124 mg/dL — ABNORMAL HIGH (ref 70–99)
Glucose-Capillary: 124 mg/dL — ABNORMAL HIGH (ref 70–99)
Glucose-Capillary: 167 mg/dL — ABNORMAL HIGH (ref 70–99)

## 2018-11-24 NOTE — Progress Notes (Signed)
[facility] [place]  Subjective  - leg still sore   Objective (!) 127/48 75 98.2 F (36.8 C) (Oral) 17 96%  Intake/Output Summary (Last 24 hours) at 11/24/2018 1613 Last data filed at 11/24/2018 1148 Gross per 24 hour  Intake 3 ml  Output 700 ml  Net -697 ml   Left AT 2+ pulse Fasciotomy clean with some erythema on the skin edges and darker color   Assessment/Planning: No further bleeding Continue warfarin Hydrogel once daily  SNF when INR 2 Cont PT OT  Fabienne Bruns 11/24/2018 4:13 PM --  Laboratory Lab Results: Recent Labs    11/23/18 0316 11/24/18 0152  WBC 7.1 7.8  HGB 7.9* 8.2*  HCT 25.5* 25.8*  PLT 186 212   BMET Recent Labs    11/23/18 0316  NA 134*  K 4.6  CL 100  CO2 28  GLUCOSE 108*  BUN 27*  CREATININE 1.03*  CALCIUM 8.0*    COAG Lab Results  Component Value Date   INR 1.2 11/24/2018   INR 1.3 (H) 11/23/2018   INR 1.7 (H) 11/22/2018   No results found for: PTT

## 2018-11-24 NOTE — Progress Notes (Signed)
Occupational Therapy Treatment Patient Details Name: Gabriella [name] MRN: [id number] DOB: [birth year] Today's Date: 11/24/2018    History of present illness Patient is a 78 year old woman with PMH of HTN, DM2, COPD, arthritis presents s/p IRRIGATION AND DEBRIDEMENT LEFT LOWER EXTREMITY.  Patient recently admitted for sepsis and s/p Left popliteal and tibial embolectomy, 4 compartment fasciotomy, vein patch angioplasty left popliteal artery 11/05/2018 and subsequently d/c to SNF.   OT comments  Pt progressing towards OT goals this session. Able to complete transfers at min guard assist level utilizing sink surface to pull up on. Pt worked on activity tolerance with 2 grooming activities at sink without seated rest break. Pt required cues to complete tasks and for safety. Current POC remains appropriate. OT will continue to follow acutely.    Follow Up Recommendations  SNF;Supervision/Assistance - 24 hour    Equipment Recommendations  Other (comment)(defer to next venue of care)    Recommendations for Other Services      Precautions / Restrictions Precautions Precautions: Fall Restrictions Weight Bearing Restrictions: No       Mobility Bed Mobility                  Transfers Overall transfer level: Needs assistance Equipment used: (utlized sink counter for assist/balance) Transfers: Sit to/from Stand Sit to Stand: Min guard         General transfer comment: Min Guard A for safety and cues for hand placement    Balance Overall balance assessment: Needs assistance Sitting-balance support: Feet supported;No upper extremity supported Sitting balance-Leahy Scale: Good     Standing balance support: Single extremity supported Standing balance-Leahy Scale: Fair Standing balance comment: leans against sink for support                           ADL either performed or assessed with clinical judgement   ADL Overall ADL's : Needs assistance/impaired      Grooming: Oral care;Minimal assistance;Standing;Wash/dry face;Brushing hair;Sitting Grooming Details (indicate cue type and reason): pt. performed oral care, and face washing while standing at the sink, noted fatigue with activity and required cues for task completion as well. Pt able to complete brushing hair with set up in recliner             Lower Body Dressing: Maximal assistance;Sitting/lateral leans Lower Body Dressing Details (indicate cue type and reason): to don sock             Functional mobility during ADLs: Minimal assistance General ADL Comments: Pt. confused but was able to perform oral care while standing at the sink, required vc for safety awareness.       Vision       Perception     Praxis      Cognition Arousal/Alertness: Awake/alert Behavior During Therapy: WFL for tasks assessed/performed Overall Cognitive Status: Impaired/Different from baseline Area of Impairment: Memory;Awareness;Attention                   Current Attention Level: Selective Memory: Decreased short-term memory     Awareness: Emergent   General Comments: Pt. confusing therapist identities multiple times during the session, completed the ADL task with vc for redirection.        Exercises     Shoulder Instructions       General Comments      Pertinent Vitals/ Pain       Pain Assessment: Faces Faces Pain Scale: Hurts a little bit  Pain Location: LLE Pain Descriptors / Indicators: Aching;Sore Pain Intervention(s): Monitored during session;Repositioned  Home Living                                          Prior Functioning/Environment              Frequency  Min 2X/week        Progress Toward Goals  OT Goals(current goals can now be found in the care plan section)  Progress towards OT goals: Progressing toward goals  Acute Rehab OT Goals Patient Stated Goal: get back [facility] to [facility] OT Goal Formulation: With  patient Potential to Achieve Goals: Good  Plan Discharge plan remains appropriate    Co-evaluation                 AM-PAC OT "6 Clicks" Daily Activity     Outcome Measure   Help from another person eating meals?: None Help from another person taking care of personal grooming?: A Little Help from another person toileting, which includes using toliet, bedpan, or urinal?: A Lot Help from another person bathing (including washing, rinsing, drying)?: A Lot Help from another person to put on and taking off regular upper body clothing?: A Little Help from another person to put on and taking off regular lower body clothing?: A Lot 6 Click Score: 16    End of Session Equipment Utilized During Treatment: Oxygen  OT Visit Diagnosis: Unsteadiness on feet (R26.81);Muscle weakness (generalized) (M62.81) Pain - Right/Left: Left Pain - part of body: Leg   Activity Tolerance Patient limited by fatigue;Patient limited by pain   Patient Left with call bell/phone within reach;in chair;with chair alarm set   Nurse Communication          Time: 1110-1135 OT Time Calculation (min): 25 min  Charges: OT General Charges $OT Visit: 1 Visit OT Treatments $Self Care/Home Management : 23-37 mins Sherryl Manges OTR/L Acute Rehabilitation Services Pager: [phone] Office: [phone]  Evern Bio Camara Renstrom 11/24/2018, 1:28 PM

## 2018-11-24 NOTE — Progress Notes (Signed)
Physical Therapy Treatment Patient Details Name: Gabriella [name] MRN: [id number] DOB: 1941-04-03 Today's Date: 11/24/2018    History of Present Illness Patient is a 78 year old woman with PMH of HTN, DM2, COPD, arthritis presents s/p IRRIGATION AND DEBRIDEMENT LEFT LOWER EXTREMITY.  Patient recently admitted for sepsis and s/p Left popliteal and tibial embolectomy, 4 compartment fasciotomy, vein patch angioplasty left popliteal artery 11/05/2018 and subsequently d/c to SNF.    PT Comments    Pt still reporting left lower extremity soreness. Session focused on room ambulation and lower extremity strengthening exercises. Pt requiring min guard assist for all functional mobility. Ambulating 10 feet using walker with abnormal gait pattern. Will continue to progress mobility as tolerated.    Follow Up Recommendations  SNF     Equipment Recommendations  None recommended by PT    Recommendations for Other Services       Precautions / Restrictions Precautions Precautions: Fall Restrictions Weight Bearing Restrictions: No    Mobility  Bed Mobility Overal bed mobility: Needs Assistance Bed Mobility: Sit to Supine     Supine to sit: Min guard     General bed mobility comments: min guard assist for negotiation back into bed  Transfers Overall transfer level: Needs assistance Equipment used: Rolling walker (2 wheeled) Transfers: Sit to/from Stand Sit to Stand: Min guard         General transfer comment: Min Guard A for safety and cues for hand placement  Ambulation/Gait Ambulation/Gait assistance: Land (Feet): 10 Feet Assistive device: Rolling walker (2 wheeled) Gait Pattern/deviations: Decreased stride length;Step-to pattern;Trunk flexed Gait velocity: decreased Gait velocity interpretation: <1.8 ft/sec, indicate of risk for recurrent falls General Gait Details: pt able to perform correct sequencing, but does demonstrate decreased gross  coordination   Stairs             Wheelchair Mobility    Modified Rankin (Stroke Patients Only)       Balance Overall balance assessment: Needs assistance Sitting-balance support: Feet supported;No upper extremity supported Sitting balance-Leahy Scale: Good     Standing balance support: Single extremity supported Standing balance-Leahy Scale: Fair Standing balance comment: leans against sink for support                            Cognition Arousal/Alertness: Awake/alert Behavior During Therapy: WFL for tasks assessed/performed Overall Cognitive Status: Impaired/Different from baseline Area of Impairment: Memory;Awareness;Attention                   Current Attention Level: Selective Memory: Decreased short-term memory     Awareness: Emergent   General Comments: Pt. confusing therapist identities multiple times during the session, completed the ADL task with vc for redirection.      Exercises General Exercises - Lower Extremity Hip ABduction/ADduction: 10 reps;Left;Supine Straight Leg Raises: 10 reps;Left;Supine    General Comments        Pertinent Vitals/Pain Pain Assessment: Faces Faces Pain Scale: Hurts a little bit Pain Location: LLE Pain Descriptors / Indicators: Aching;Sore Pain Intervention(s): Monitored during session    Home Living                      Prior Function            PT Goals (current goals can now be found in the care plan section) Acute Rehab PT Goals Patient Stated Goal: get back home to [facility] to [facility] PT Goal Formulation:  With patient Time For Goal Achievement: 12/08/18 Potential to Achieve Goals: Fair Progress towards PT goals: Progressing toward goals    Frequency    Min 3X/week      PT Plan Current plan remains appropriate    Co-evaluation              AM-PAC PT "6 Clicks" Mobility   Outcome Measure  Help needed turning from your back to your side while in a flat bed  without using bedrails?: None Help needed moving from lying on your back to sitting on the side of a flat bed without using bedrails?: A Little Help needed moving to and from a bed to a chair (including a wheelchair)?: A Little Help needed standing up from a chair using your arms (e.g., wheelchair or bedside chair)?: A Little Help needed to walk in hospital room?: A Little Help needed climbing 3-5 steps with a railing? : Total 6 Click Score: 17    End of Session Equipment Utilized During Treatment: Gait belt Activity Tolerance: Patient tolerated treatment well Patient left: in bed;with call bell/phone within reach Nurse Communication: Mobility status PT Visit Diagnosis: Unsteadiness on feet (R26.81);Muscle weakness (generalized) (M62.81);Difficulty in walking, not elsewhere classified (R26.2);Pain Pain - Right/Left: Left Pain - part of body: Leg     Time: 3845-3646 PT Time Calculation (min) (ACUTE ONLY): 9 min  Charges:  $Therapeutic Activity: 8-22 mins                    Laurina Bustle, PT, DPT Acute Rehabilitation Services Pager [phone] Office [phone]    Vanetta Mulders 11/24/2018, 4:36 PM

## 2018-11-24 NOTE — Discharge Instructions (Addendum)
[facility]  Discharge Instructions  Lower Extremity Angiogram; Angioplasty/Stenting  Please refer to the following instructions for your post-procedure care. Your surgeon or physician assistant will discuss any changes with you.  Activity  Avoid lifting more than 8 pounds (1 gallons of milk) for 72 hours (3 days) after your procedure. You may walk as much as you can tolerate. It's OK to drive after 72 hours.  Bathing/Showering  You may shower the day after your procedure. If you have a bandage, you may remove it at 24- 48 hours. Clean your incision site with mild soap and water. Pat the area dry with a clean towel.  Diet  Resume your pre-procedure diet. There are no special food restrictions following this procedure. All patients with peripheral vascular disease should follow a low fat/low cholesterol diet. In order to heal from your surgery, it is CRITICAL to get adequate nutrition. Your body requires vitamins, minerals, and protein. Vegetables are the best source of vitamins and minerals. Vegetables also provide the perfect balance of protein. Processed food has little nutritional value, so try to avoid this.  Medications  Resume taking all of your medications unless your doctor tells you not to. If your incision is causing pain, you may take over-the-counter pain relievers such as acetaminophen (Tylenol)  Follow Up  Follow up will be arranged at the time of your procedure. You may have an office visit scheduled or may be scheduled for surgery. Ask your surgeon if you have any questions.  Please call us immediately for any of the following conditions: Severe or worsening pain your legs or feet at rest or with walking. Increased pain, redness, drainage at your groin puncture site. Fever of 101 degrees or higher. If you have any mild or slow bleeding from your puncture site: lie down, apply firm constant pressure over the area with a piece of  gauze or a clean wash cloth for 30 minutes- no peeking!, call 911 right away if you are still bleeding after 30 minutes, or if the bleeding is heavy and unmanageable.  Reduce your risk factors of vascular disease:  Stop smoking. If you would like help call [facility] at 1-800-QUIT-NOW [phone]) 757-4919) or [place] at [phone]. Manage your cholesterol Maintain a desired weight Control your diabetes Keep your blood pressure down  If you have any questions, please call the office at [phone]     Information on my medicine - Coumadin   (Warfarin)  This medication education was reviewed with me or my healthcare representative as part of my discharge preparation.  The pharmacist that spoke with me during my hospital stay was:  Mosetta Anis, [facility]  Why was Coumadin prescribed for you? Coumadin was prescribed for you because you have a blood clot or a medical condition that can cause an increased risk of forming blood clots. Blood clots can cause serious health problems by blocking the flow of blood to the heart, lung, or brain. Coumadin can prevent harmful blood clots from forming. As a reminder your indication for Coumadin is:   Blood Clotting Disorder  What test will check on my response to Coumadin? While on Coumadin (warfarin) you will need to have an INR test regularly to ensure that your dose is keeping you in the desired range. The INR (international normalized ratio) number is calculated from the result of the laboratory test called prothrombin time (PT).  If an INR APPOINTMENT HAS NOT ALREADY BEEN MADE FOR YOU please schedule an appointment  to have this lab work done by your health care provider within 7 days. Your INR goal is usually a number between:  2 to 3 or your provider may give you a more narrow range like 2-2.5.  Ask your health care provider during an office visit what your goal INR is.  What  do you need to  know  About  COUMADIN? Take Coumadin (warfarin)  exactly as prescribed by your healthcare provider about the same time each day.  DO NOT stop taking without talking to the doctor who prescribed the medication.  Stopping without other blood clot prevention medication to take the place of Coumadin may increase your risk of developing a new clot or stroke.  Get refills before you run out.  What do you do if you miss a dose? If you miss a dose, take it as soon as you remember on the same day then continue your regularly scheduled regimen the next day.  Do not take two doses of Coumadin at the same time.  Important Safety Information A possible side effect of Coumadin (Warfarin) is an increased risk of bleeding. You should call your healthcare provider right away if you experience any of the following: ? Bleeding from an injury or your nose that does not stop. ? Unusual colored urine (red or dark brown) or unusual colored stools (red or black). ? Unusual bruising for unknown reasons. ? A serious fall or if you hit your head (even if there is no bleeding).  Some foods or medicines interact with Coumadin (warfarin) and might alter your response to warfarin. To help avoid this: ? Eat a balanced diet, maintaining a consistent amount of Vitamin K. ? Notify your provider about major diet changes you plan to make. ? Avoid alcohol or limit your intake to 1 drink for women and 2 drinks for men per day. (1 drink is 5 oz. wine, 12 oz. beer, or 1.5 oz. liquor.)  Make sure that ANY health care provider who prescribes medication for you knows that you are taking Coumadin (warfarin).  Also make sure the healthcare provider who is monitoring your Coumadin knows when you have started a new medication including herbals and non-prescription products.  Coumadin (Warfarin)  Major Drug Interactions  Increased Warfarin Effect Decreased Warfarin Effect  Alcohol (large quantities) Antibiotics (esp. Septra/Bactrim, Flagyl, Cipro) Amiodarone (Cordarone) Aspirin  (ASA) Cimetidine (Tagamet) Megestrol (Megace) NSAIDs (ibuprofen, naproxen, etc.) Piroxicam (Feldene) Propafenone (Rythmol SR) Propranolol (Inderal) Isoniazid (INH) Posaconazole (Noxafil) Barbiturates (Phenobarbital) Carbamazepine (Tegretol) Chlordiazepoxide (Librium) Cholestyramine (Questran) Griseofulvin Oral Contraceptives Rifampin Sucralfate (Carafate) Vitamin K   Coumadin (Warfarin) Major Herbal Interactions  Increased Warfarin Effect Decreased Warfarin Effect  Garlic Ginseng Ginkgo biloba Coenzyme Q10 Green tea St. Johns wort    Coumadin (Warfarin) FOOD Interactions  Eat a consistent number of servings per week of foods HIGH in Vitamin K (1 serving =  cup)  Collards (cooked, or boiled & drained) Kale (cooked, or boiled & drained) Mustard greens (cooked, or boiled & drained) Parsley *serving size only =  cup Spinach (cooked, or boiled & drained) Swiss chard (cooked, or boiled & drained) Turnip greens (cooked, or boiled & drained)  Eat a consistent number of servings per week of foods MEDIUM-HIGH in Vitamin K (1 serving = 1 cup)  Asparagus (cooked, or boiled & drained) Broccoli (cooked, boiled & drained, or raw & chopped) Brussel sprouts (cooked, or boiled & drained) *serving size only =  cup Lettuce, raw (green leaf, endive, romaine) Spinach, raw Turnip greens,  raw & chopped   These websites have more information on Coumadin (warfarin):  http://www.king-russell.com/; https://www.hines.net/;

## 2018-11-24 NOTE — Progress Notes (Signed)
Patient out of bed to chair with assistance (1 person). Tolerated well. No complaints of shortness of breath, dizziness, or headache. Continues on room air. O2 SAT 94-95%. Bandage to right leg remains clean, dry, and intact. Pain management maintained with repositioning and medication (see MAR).

## 2018-11-25 LAB — CBC
HCT: 25.7 % — ABNORMAL LOW (ref 36.0–46.0)
HEMOGLOBIN: 8 g/dL — AB (ref 12.0–15.0)
MCH: 28.2 pg (ref 26.0–34.0)
MCHC: 31.1 g/dL (ref 30.0–36.0)
MCV: 90.5 fL (ref 80.0–100.0)
Platelets: 223 10*3/uL (ref 150–400)
RBC: 2.84 MIL/uL — ABNORMAL LOW (ref 3.87–5.11)
RDW: 13.2 % (ref 11.5–15.5)
WBC: 7.4 10*3/uL (ref 4.0–10.5)
nRBC: 0 % (ref 0.0–0.2)

## 2018-11-25 LAB — PROTIME-INR
INR: 1.1 (ref 0.8–1.2)
Prothrombin Time: 14.5 seconds (ref 11.4–15.2)

## 2018-11-25 LAB — GLUCOSE, CAPILLARY
Glucose-Capillary: 147 mg/dL — ABNORMAL HIGH (ref 70–99)
Glucose-Capillary: 189 mg/dL — ABNORMAL HIGH (ref 70–99)
Glucose-Capillary: 116 mg/dL — ABNORMAL HIGH (ref 70–99)
Glucose-Capillary: 117 mg/dL — ABNORMAL HIGH (ref 70–99)

## 2018-11-25 NOTE — Progress Notes (Signed)
[facility]   Subjective  - Some pain/tenderness at left lateral leg.   Objective (!) 149/69 90 97.7 F (36.5 C) (Oral) 14 93%  Intake/Output Summary (Last 24 hours) at 11/25/2018 0929 Last data filed at 11/25/2018 0756 Gross per 24 hour  Intake 483 ml  Output 700 ml  Net -217 ml    Left DP palpable Left lateral fasciotomy with some dark skin edges - underlying muscle healthy  Laboratory Lab Results: Recent Labs    11/24/18 0152 11/25/18 0220  WBC 7.8 7.4  HGB 8.2* 8.0*  HCT 25.8* 25.7*  PLT 212 223   BMET Recent Labs    11/23/18 0316  NA 134*  K 4.6  CL 100  CO2 28  GLUCOSE 108*  BUN 27*  CREATININE 1.03*  CALCIUM 8.0*    COAG Lab Results  Component Value Date   INR 1.1 11/25/2018   INR 1.2 11/24/2018   INR 1.3 (H) 11/23/2018   No results found for: PTT  Assessment/Planning:  INR 1.1 today.  Waiting for INR>2 and then return to SNF.  Wet to dry dressing change to lateral fasciotomy - changed at bedside this am.  Cont OT/PT.  Cephus Shelling 11/25/2018 9:29 AM --

## 2018-11-25 NOTE — Progress Notes (Addendum)
Pt only walked around the room and once a very small distance in a hallway, pretty sat in a recliner throughout the day, vitals been stable, Pain covered by oxycodone, new ace wrap applied to the left leg, Frequently encouraging to ambulate but pt is refusing because of pain, pain medicine is given around the clock, will continue to monitor  Lonia Farber, RN

## 2018-11-26 LAB — CBC
HCT: 26 % — ABNORMAL LOW (ref 36.0–46.0)
Hemoglobin: 8.1 g/dL — ABNORMAL LOW (ref 12.0–15.0)
MCH: 28.3 pg (ref 26.0–34.0)
MCHC: 31.2 g/dL (ref 30.0–36.0)
MCV: 90.9 fL (ref 80.0–100.0)
PLATELETS: 231 10*3/uL (ref 150–400)
RBC: 2.86 MIL/uL — ABNORMAL LOW (ref 3.87–5.11)
RDW: 13.1 % (ref 11.5–15.5)
WBC: 6.4 10*3/uL (ref 4.0–10.5)
nRBC: 0 % (ref 0.0–0.2)

## 2018-11-26 LAB — PROTIME-INR
INR: 1.2 (ref 0.8–1.2)
Prothrombin Time: 15.3 seconds — ABNORMAL HIGH (ref 11.4–15.2)

## 2018-11-26 LAB — GLUCOSE, CAPILLARY
GLUCOSE-CAPILLARY: 122 mg/dL — AB (ref 70–99)
Glucose-Capillary: 147 mg/dL — ABNORMAL HIGH (ref 70–99)
Glucose-Capillary: 166 mg/dL — ABNORMAL HIGH (ref 70–99)
Glucose-Capillary: 135 mg/dL — ABNORMAL HIGH (ref 70–99)

## 2018-11-26 NOTE — Progress Notes (Signed)
[facility] [place]  Subjective  - Some ongoing pain/tenderness at left lateral leg fasciotomy.   Objective (!) 135/107 64 97.7 F (36.5 C) (Oral) 12 100%  Intake/Output Summary (Last 24 hours) at 11/26/2018 0909 Last data filed at 11/26/2018 0000 Gross per 24 hour  Intake 303 ml  Output 700 ml  Net -397 ml    Left AT palpable Left lateral fasciotomy with some dark skin edges - underlying muscle healthy  Laboratory Lab Results: Recent Labs    11/25/18 0220 11/26/18 0316  WBC 7.4 6.4  HGB 8.0* 8.1*  HCT 25.7* 26.0*  PLT 223 231   BMET No results for input(s): NA, K, CL, CO2, GLUCOSE, BUN, CREATININE, CALCIUM in the last 72 hours.  COAG Lab Results  Component Value Date   INR 1.2 11/26/2018   INR 1.1 11/25/2018   INR 1.2 11/24/2018   No results found for: PTT  Assessment/Planning:  INR 1.2 today.  Waiting for INR>2 and then return to SNF.  Continue coumadin 5 mg daily - home dose.  Hgb stable.  Wet to dry dressing change to lateral fasciotomy - changed at bedside this am.  Cont OT/PT.  Cephus Shelling 11/26/2018 9:09 AM --

## 2018-11-26 NOTE — Plan of Care (Signed)

## 2018-11-27 LAB — CBC
HCT: 25.9 % — ABNORMAL LOW (ref 36.0–46.0)
Hemoglobin: 8.4 g/dL — ABNORMAL LOW (ref 12.0–15.0)
MCH: 29.5 pg (ref 26.0–34.0)
MCHC: 32.4 g/dL (ref 30.0–36.0)
MCV: 90.9 fL (ref 80.0–100.0)
PLATELETS: 254 10*3/uL (ref 150–400)
RBC: 2.85 MIL/uL — ABNORMAL LOW (ref 3.87–5.11)
RDW: 12.9 % (ref 11.5–15.5)
WBC: 7.2 10*3/uL (ref 4.0–10.5)
nRBC: 0 % (ref 0.0–0.2)

## 2018-11-27 LAB — GLUCOSE, CAPILLARY
Glucose-Capillary: 109 mg/dL — ABNORMAL HIGH (ref 70–99)
Glucose-Capillary: 104 mg/dL — ABNORMAL HIGH (ref 70–99)
Glucose-Capillary: 127 mg/dL — ABNORMAL HIGH (ref 70–99)
Glucose-Capillary: 153 mg/dL — ABNORMAL HIGH (ref 70–99)

## 2018-11-27 LAB — PROTIME-INR
INR: 1.4 — ABNORMAL HIGH (ref 0.8–1.2)
PROTHROMBIN TIME: 16.7 s — AB (ref 11.4–15.2)

## 2018-11-27 NOTE — Progress Notes (Addendum)
  Progress Note    11/27/2018 8:35 AM 7 Days Post-Op  Subjective:  Patient does not want to return to [place] at discharge.  Still having pain in LLE.   Vitals:   11/27/18 0430 11/27/18 0740  BP:    Pulse:    Resp: 12   Temp:    SpO2:  98%   Physical Exam: Lungs:  Non labored Incisions:  Portion of skin edge nonviable; healthy wound bed Extremities:  L ATA pulse palpable Neurologic: A&O  CBC    Component Value Date/Time   WBC 7.2 11/27/2018 0224   RBC 2.85 (L) 11/27/2018 0224   HGB 8.4 (L) 11/27/2018 0224   HCT 25.9 (L) 11/27/2018 0224   PLT 254 11/27/2018 0224   MCV 90.9 11/27/2018 0224   MCH 29.5 11/27/2018 0224   MCHC 32.4 11/27/2018 0224   RDW 12.9 11/27/2018 0224   LYMPHSABS 2.8 11/20/2018 1907   MONOABS 0.5 11/20/2018 1907   EOSABS 0.5 11/20/2018 1907   BASOSABS 0.1 11/20/2018 1907    BMET    Component Value Date/Time   NA 134 (L) 11/23/2018 0316   K 4.6 11/23/2018 0316   CL 100 11/23/2018 0316   CO2 28 11/23/2018 0316   GLUCOSE 108 (H) 11/23/2018 0316   BUN 27 (H) 11/23/2018 0316   CREATININE 1.03 (H) 11/23/2018 0316   CALCIUM 8.0 (L) 11/23/2018 0316   GFRNONAA 53 (L) 11/23/2018 0316   GFRAA >60 11/23/2018 0316    INR    Component Value Date/Time   INR 1.4 (H) 11/27/2018 0224     Intake/Output Summary (Last 24 hours) at 11/27/2018 0835 Last data filed at 11/27/2018 0500 Gross per 24 hour  Intake 403 ml  Output 850 ml  Net -447 ml     Assessment/Plan:  78 y.o. female is s/p tibial thrombectomy re-admitted for fasciotomy incision bleeding 7 Days Post-Op   Perfusing L leg well with palpable ATA pulse Portion of skin edge lateral fasciotomy incision nonviable however healthy wound bed; continue once daily hydrogel CSW consulted at the request of the patient to possibly d/c to a different SNF D/c when INR>2   Emilie Rutter, PA-C [facility] [phone] 11/27/2018 8:35 AM  Agree with above. Continue hydrogel once  daily.  Skin edges will slough at some point.  No plans to return to OR.  To SNF when INR 2 probably midweek  Fabienne Bruns, MD [facility] [place] Office: [phone] Pager: [phone]

## 2018-11-27 NOTE — Plan of Care (Signed)
  Problem: Clinical Measurements: Goal: Respiratory complications will improve Outcome: Progressing   Problem: Nutrition: Goal: Adequate nutrition will be maintained Outcome: Completed/Met   Problem: Coping: Goal: Level of anxiety will decrease Outcome: Adequate for Discharge  Pt up to chair today 12-3p, tolerated fairly. C/o pain 8/10 continuous. VSS. Sleepy today.

## 2018-11-27 NOTE — Clinical Social Work Note (Signed)
Left voicemail for [facility] social worker, Marylu Lund to see if patient is able to switch SNF's.  Charlynn Court, CSW [phone]

## 2018-11-28 LAB — CBC
HEMATOCRIT: 25.3 % — AB (ref 36.0–46.0)
Hemoglobin: 7.6 g/dL — ABNORMAL LOW (ref 12.0–15.0)
MCH: 27.5 pg (ref 26.0–34.0)
MCHC: 30 g/dL (ref 30.0–36.0)
MCV: 91.7 fL (ref 80.0–100.0)
Platelets: 232 10*3/uL (ref 150–400)
RBC: 2.76 MIL/uL — ABNORMAL LOW (ref 3.87–5.11)
RDW: 13.1 % (ref 11.5–15.5)
WBC: 6.9 10*3/uL (ref 4.0–10.5)
nRBC: 0 % (ref 0.0–0.2)

## 2018-11-28 LAB — PROTIME-INR
INR: 1.4 — ABNORMAL HIGH (ref 0.8–1.2)
Prothrombin Time: 16.9 seconds — ABNORMAL HIGH (ref 11.4–15.2)

## 2018-11-28 LAB — GLUCOSE, CAPILLARY
Glucose-Capillary: 124 mg/dL — ABNORMAL HIGH (ref 70–99)
Glucose-Capillary: 115 mg/dL — ABNORMAL HIGH (ref 70–99)
Glucose-Capillary: 116 mg/dL — ABNORMAL HIGH (ref 70–99)
Glucose-Capillary: 103 mg/dL — ABNORMAL HIGH (ref 70–99)

## 2018-11-28 MED ORDER — WARFARIN SODIUM 7.5 MG PO TABS
7.5000 mg | ORAL_TABLET | Freq: Every day | ORAL | Status: DC
  Administered 2018-11-28 – 2018-11-29 (×2): 7.5 mg via ORAL
  Filled 2018-11-28 (×2): qty 1

## 2018-11-28 MED ORDER — FERROUS SULFATE 325 (65 FE) MG PO TABS
325.0000 mg | ORAL_TABLET | Freq: Three times a day (TID) | ORAL | Status: DC
  Administered 2018-11-28 – 2018-12-05 (×21): 325 mg via ORAL
  Filled 2018-11-28 (×21): qty 1

## 2018-11-28 NOTE — Progress Notes (Signed)
Occupational Therapy Treatment Patient Details Name: Karlesha Karcher MRN: [id number] DOB: 10/30/1940 Today's Date: 11/28/2018    History of present illness Patient is a 78 year old woman with PMH of HTN, DM2, COPD, arthritis presents s/p IRRIGATION AND DEBRIDEMENT LEFT LOWER EXTREMITY.  Patient recently admitted for sepsis and s/p Left popliteal and tibial embolectomy, 4 compartment fasciotomy, vein patch angioplasty left popliteal artery 11/05/2018 and subsequently d/c to SNF.   OT comments  Pt progressing towards OT goals, presents supine in bed agreeable to treatment session. Pt requiring minA (+2 safety) using RW for completion of functional mobility. Able to perform seated UB ADL with setup-minguard assist, requiring mod-maxA for LB bathing/dressing ADL this session. Pt requiring cues/repetition to initiate and follow commands. Feel SNF recommendation remains appropriate at this time. Will continue to follow acutely.    Follow Up Recommendations  SNF;Supervision/Assistance - 24 hour    Equipment Recommendations  Other (comment)(defer to next venue)          Precautions / Restrictions Precautions Precautions: Fall Restrictions Weight Bearing Restrictions: No       Mobility Bed Mobility Overal bed mobility: Needs Assistance Bed Mobility: Supine to Sit     Supine to sit: Min assist     General bed mobility comments: min assist for elevation or trunk to upright and scoot to EOB  Transfers Overall transfer level: Needs assistance Equipment used: Rolling walker (2 wheeled) Transfers: Sit to/from Stand Sit to Stand: Min guard         General transfer comment: Min Guard A for safety and cues for hand placement    Balance Overall balance assessment: Needs assistance Sitting-balance support: Feet supported;No upper extremity supported Sitting balance-Leahy Scale: Good     Standing balance support: Single extremity supported Standing balance-Leahy Scale: Poor Standing  balance comment: reliance on Rw and on counter for support                           ADL either performed or assessed with clinical judgement   ADL Overall ADL's : Needs assistance/impaired     Grooming: Wash/dry face;Set up;Sitting   Upper Body Bathing: Set up;Min guard;Sitting Upper Body Bathing Details (indicate cue type and reason): sitting on BSC in front of sink Lower Body Bathing: Moderate assistance;Sit to/from stand Lower Body Bathing Details (indicate cue type and reason): assist to cleanse buttocks and steadying assist for standing balance Upper Body Dressing : Min guard;Set up;Sitting Upper Body Dressing Details (indicate cue type and reason): donning new gown Lower Body Dressing: Maximal assistance;Sit to/from stand Lower Body Dressing Details (indicate cue type and reason): minA standing balance; assist to don L sock as pt unable despite attempts to perform Toilet Transfer: Minimal assistance;+2 for safety/equipment;Ambulation;RW;BSC Toilet Transfer Details (indicate cue type and reason): BSC placed in front of sink Toileting- Clothing Manipulation and Hygiene: Moderate assistance;Maximal assistance;Sit to/from stand       Functional mobility during ADLs: Minimal assistance;+2 for physical assistance;+2 for safety/equipment;Rolling walker       Vision       Perception     Praxis      Cognition Arousal/Alertness: Awake/alert Behavior During Therapy: WFL for tasks assessed/performed Overall Cognitive Status: Impaired/Different from baseline Area of Impairment: Memory;Awareness;Attention                   Current Attention Level: Selective Memory: Decreased short-term memory     Awareness: Emergent   General Comments: pt requires increased  cues, repetition to follow commands during session         Exercises     Shoulder Instructions       General Comments assist for hygiene and pericare during session    Pertinent Vitals/  Pain       Pain Assessment: Faces Faces Pain Scale: Hurts even more Pain Location: LLE Pain Descriptors / Indicators: Aching;Sore Pain Intervention(s): Monitored during session;Repositioned  Home Living                                          Prior Functioning/Environment              Frequency  Min 2X/week        Progress Toward Goals  OT Goals(current goals can now be found in the care plan section)  Progress towards OT goals: Progressing toward goals  Acute Rehab OT Goals Patient Stated Goal: get back home to go to [facility] OT Goal Formulation: With patient Time For Goal Achievement: 12/06/18 Potential to Achieve Goals: Good ADL Goals Pt Will Perform Grooming: with supervision;standing Pt Will Perform Lower Body Bathing: with supervision;sitting/lateral leans Pt Will Perform Lower Body Dressing: with min assist;sit to/from stand Pt Will Transfer to Toilet: with supervision;ambulating Pt Will Perform Toileting - Clothing Manipulation and hygiene: with min assist;sit to/from stand  Plan Discharge plan remains appropriate    Co-evaluation    PT/OT/SLP Co-Evaluation/Treatment: Yes Reason for Co-Treatment: For patient/therapist safety;To address functional/ADL transfers PT goals addressed during session: Mobility/safety with mobility OT goals addressed during session: ADL's and self-care;Proper use of Adaptive equipment and DME      AM-PAC OT "6 Clicks" Daily Activity     Outcome Measure   Help from another person eating meals?: None Help from another person taking care of personal grooming?: A Little Help from another person toileting, which includes using toliet, bedpan, or urinal?: A Lot Help from another person bathing (including washing, rinsing, drying)?: A Lot Help from another person to put on and taking off regular upper body clothing?: A Little Help from another person to put on and taking off regular lower body clothing?: A Lot 6  Click Score: 16    End of Session Equipment Utilized During Treatment: Gait belt;Rolling walker  OT Visit Diagnosis: Unsteadiness on feet (R26.81);Muscle weakness (generalized) (M62.81) Pain - Right/Left: Left Pain - part of body: Leg   Activity Tolerance Patient tolerated treatment well   Patient Left in chair;with call bell/phone within reach   Nurse Communication Mobility status        Time: 1040-1106 OT Time Calculation (min): 26 min  Charges: OT General Charges $OT Visit: 1 Visit OT Treatments $Self Care/Home Management : 8-22 mins  Marcy Siren, OT Supplemental Rehabilitation Services Pager [phone] Office [phone]    Orlando Penner 11/28/2018, 1:55 PM

## 2018-11-28 NOTE — Clinical Social Work Note (Signed)
Spoke with patient regarding SNF and she is requesting [facility] if she cannot go home. Admissions coordinator is reviewing referral and checking bed availability.  Charlynn Court, CSW [phone]

## 2018-11-28 NOTE — Progress Notes (Signed)
Physical Therapy Treatment Patient Details Name: Gabriella [name] MRN: [id number] DOB: 1941-03-24 Today's Date: 11/28/2018    History of Present Illness Patient is a 78 year old woman with PMH of HTN, DM2, COPD, arthritis presents s/p IRRIGATION AND DEBRIDEMENT LEFT LOWER EXTREMITY.  Patient recently admitted for sepsis and s/p Left popliteal and tibial embolectomy, 4 compartment fasciotomy, vein patch angioplasty left popliteal artery 11/05/2018 and subsequently d/c to SNF.    PT Comments    Patient seen for activity progression, tolerated a little more activity today but remains limited by pain and fatigue. Current POC remains appropriate.   Follow Up Recommendations  SNF     Equipment Recommendations  None recommended by PT    Recommendations for Other Services       Precautions / Restrictions Precautions Precautions: Fall Restrictions Weight Bearing Restrictions: No    Mobility  Bed Mobility Overal bed mobility: Needs Assistance Bed Mobility: Supine to Sit     Supine to sit: Min assist     General bed mobility comments: min assist for elevation or trunk to upright and scoot to EOB  Transfers Overall transfer level: Needs assistance Equipment used: Rolling walker (2 wheeled) Transfers: Sit to/from Stand Sit to Stand: Min guard         General transfer comment: Min Guard A for safety and cues for hand placement  Ambulation/Gait Ambulation/Gait assistance: Min guard;Min Chemical engineer (Feet): 24 Feet Assistive device: Rolling walker (2 wheeled) Gait Pattern/deviations: Decreased stride length;Step-to pattern;Trunk flexed Gait velocity: decreased Gait velocity interpretation: <1.8 ft/sec, indicate of risk for recurrent falls General Gait Details: Continues to require cues for sequencing, increased effort noted due to antalgic gait today, min assist for stability with 2 episode of balance check   Stairs             Wheelchair Mobility    Modified  Rankin (Stroke Patients Only)       Balance Overall balance assessment: Needs assistance Sitting-balance support: Feet supported;No upper extremity supported Sitting balance-Leahy Scale: Good     Standing balance support: Single extremity supported Standing balance-Leahy Scale: Poor Standing balance comment: reliance on Rw and on counter for support                            Cognition Arousal/Alertness: Awake/alert Behavior During Therapy: WFL for tasks assessed/performed Overall Cognitive Status: Impaired/Different from baseline Area of Impairment: Memory;Awareness;Attention                   Current Attention Level: Selective Memory: Decreased short-term memory     Awareness: Emergent          Exercises      General Comments General comments (skin integrity, edema, etc.): assist for hygiene and pericare during session      Pertinent Vitals/Pain Pain Assessment: Faces Faces Pain Scale: Hurts even more Pain Location: LLE Pain Descriptors / Indicators: Aching;Sore Pain Intervention(s): Monitored during session    Home Living                      Prior Function            PT Goals (current goals can now be found in the care plan section) Acute Rehab PT Goals Patient Stated Goal: get back home to go to [facility] PT Goal Formulation: With patient Time For Goal Achievement: 12/08/18 Potential to Achieve Goals: Fair Progress towards PT goals: Progressing toward goals  Frequency    Min 3X/week      PT Plan Current plan remains appropriate    Co-evaluation PT/OT/SLP Co-Evaluation/Treatment: Yes(dove tailed session) Reason for Co-Treatment: For patient/therapist safety PT goals addressed during session: Mobility/safety with mobility OT goals addressed during session: ADL's and self-care      AM-PAC PT "6 Clicks" Mobility   Outcome Measure  Help needed turning from your back to your side while in a flat bed without using  bedrails?: None Help needed moving from lying on your back to sitting on the side of a flat bed without using bedrails?: A Little Help needed moving to and from a bed to a chair (including a wheelchair)?: A Little Help needed standing up from a chair using your arms (e.g., wheelchair or bedside chair)?: A Little Help needed to walk in hospital room?: A Little Help needed climbing 3-5 steps with a railing? : Total 6 Click Score: 17    End of Session Equipment Utilized During Treatment: Gait belt Activity Tolerance: Patient tolerated treatment well Patient left: in chair;with call bell/phone within reach;with chair alarm set Nurse Communication: Mobility status PT Visit Diagnosis: Unsteadiness on feet (R26.81);Muscle weakness (generalized) (M62.81);Difficulty in walking, not elsewhere classified (R26.2);Pain Pain - Right/Left: Left Pain - part of body: Leg     Time: 1040-1106(dove tailed session) PT Time Calculation (min) (ACUTE ONLY): 26 min  Charges:  $Gait Training: 8-22 mins                     Charlotte Crumb, PT DPT  Board Certified Neurologic Specialist Acute Rehabilitation Services Pager [phone] Office [phone]    Fabio Asa 11/28/2018, 12:48 PM

## 2018-11-28 NOTE — Progress Notes (Signed)
Pt was little confused in the morning, she thought she was at home and no time orientations. [facility] came to check patient then patient knew she is in the hospital and month and years. Patient wanted to be discharged to home rather than go to [place]. Patient's better to go SNF for getting strength back. Vascular Dr. Darrick Penna changed Lt. Leg dressing. Removed staples on Lt. Leg. HS [employer]

## 2018-11-28 NOTE — Progress Notes (Addendum)
[facility]  Subjective  - feels ok   Objective (!) 140/54 71 98.5 F (36.9 C) (Oral) 15 94%  Intake/Output Summary (Last 24 hours) at 11/28/2018 1201 Last data filed at 11/28/2018 0932 Gross per 24 hour  Intake 703 ml  Output 700 ml  Net 3 ml   Left leg fasciotomy unchanged granulating some 2+ AT pulse  Assessment/Planning: Slowly improving leg wound, d/c staples today INR slowly trending up after 4 days 5 mg warfarin Will give 7.5 today SNF when INR 2 Acute blood loss anemia asymptomatic trend for now Add TID iron  Fabienne Bruns 11/28/2018 12:01 PM --  Laboratory Lab Results: Recent Labs    11/27/18 0224 11/28/18 0233  WBC 7.2 6.9  HGB 8.4* 7.6*  HCT 25.9* 25.3*  PLT 254 232   BMET No results for input(s): NA, K, CL, CO2, GLUCOSE, BUN, CREATININE, CALCIUM in the last 72 hours.  COAG Lab Results  Component Value Date   INR 1.4 (H) 11/28/2018   INR 1.4 (H) 11/27/2018   INR 1.2 11/26/2018   No results found for: PTT

## 2018-11-29 LAB — PROTIME-INR
INR: 1.3 — ABNORMAL HIGH (ref 0.8–1.2)
Prothrombin Time: 16 seconds — ABNORMAL HIGH (ref 11.4–15.2)

## 2018-11-29 LAB — GLUCOSE, CAPILLARY
Glucose-Capillary: 89 mg/dL (ref 70–99)
Glucose-Capillary: 91 mg/dL (ref 70–99)
Glucose-Capillary: 87 mg/dL (ref 70–99)
Glucose-Capillary: 106 mg/dL — ABNORMAL HIGH (ref 70–99)

## 2018-11-29 NOTE — Progress Notes (Signed)
[facility]   Subjective  - feels ok   Objective 110/66 64 97.8 F (36.6 C) (Oral) 17 98%  Intake/Output Summary (Last 24 hours) at 11/29/2018 0739 Last data filed at 11/28/2018 2300 Gross per 24 hour  Intake 466 ml  Output 600 ml  Net -134 ml   Fasciotomy continues to granulate AT pulse 2+  Assessment/Planning: Left leg embolus on warfarin changed to 7.5 mg daily yesterday Continue hydrogel dressings left leg Hopefully SNF Friday if INR 2  Fabienne Bruns 11/29/2018 7:39 AM --  Laboratory Lab Results: Recent Labs    11/27/18 0224 11/28/18 0233  WBC 7.2 6.9  HGB 8.4* 7.6*  HCT 25.9* 25.3*  PLT 254 232   BMET No results for input(s): NA, K, CL, CO2, GLUCOSE, BUN, CREATININE, CALCIUM in the last 72 hours.  COAG Lab Results  Component Value Date   INR 1.3 (H) 11/29/2018   INR 1.4 (H) 11/28/2018   INR 1.4 (H) 11/27/2018   No results found for: PTT

## 2018-11-29 NOTE — Care Management Important Message (Signed)
Important Message  Patient Details  Name: Gabriella [name] MRN: [id number] Date of Birth: 1941/03/31   Medicare Important Message Given:  Yes    Orson Aloe 11/29/2018, 4:51 PM

## 2018-11-30 ENCOUNTER — Encounter: Payer: Medicare (Managed Care) | Admitting: Vascular Surgery

## 2018-11-30 ENCOUNTER — Encounter (HOSPITAL_COMMUNITY): Payer: Medicare (Managed Care)

## 2018-11-30 LAB — GLUCOSE, CAPILLARY
GLUCOSE-CAPILLARY: 112 mg/dL — AB (ref 70–99)
Glucose-Capillary: 75 mg/dL (ref 70–99)
Glucose-Capillary: 83 mg/dL (ref 70–99)
Glucose-Capillary: 99 mg/dL (ref 70–99)

## 2018-11-30 LAB — CBC
HCT: 26.4 % — ABNORMAL LOW (ref 36.0–46.0)
Hemoglobin: 8.3 g/dL — ABNORMAL LOW (ref 12.0–15.0)
MCH: 28 pg (ref 26.0–34.0)
MCHC: 31.4 g/dL (ref 30.0–36.0)
MCV: 89.2 fL (ref 80.0–100.0)
PLATELETS: 278 10*3/uL (ref 150–400)
RBC: 2.96 MIL/uL — ABNORMAL LOW (ref 3.87–5.11)
RDW: 12.6 % (ref 11.5–15.5)
WBC: 7.1 10*3/uL (ref 4.0–10.5)
nRBC: 0 % (ref 0.0–0.2)

## 2018-11-30 LAB — PROTIME-INR
INR: 1.5 — ABNORMAL HIGH (ref 0.8–1.2)
Prothrombin Time: 17.6 seconds — ABNORMAL HIGH (ref 11.4–15.2)

## 2018-11-30 MED ORDER — WARFARIN - PHARMACIST DOSING INPATIENT
Freq: Every day | Status: DC
  Administered 2018-11-30 – 2018-12-03 (×2)

## 2018-11-30 MED ORDER — WARFARIN SODIUM 7.5 MG PO TABS
7.5000 mg | ORAL_TABLET | Freq: Once | ORAL | Status: AC
  Administered 2018-11-30: 7.5 mg via ORAL
  Filled 2018-11-30: qty 1

## 2018-11-30 NOTE — Progress Notes (Signed)
[facility] [place]  Subjective  - feels ok   Objective (!) 114/51 62 98.4 F (36.9 C) (Oral) 16 94%  Intake/Output Summary (Last 24 hours) at 11/30/2018 1025 Last data filed at 11/29/2018 2200 Gross per 24 hour  Intake 680 ml  Output -  Net 680 ml   2+ AT pulse left leg  Assessment/Planning: INR starting to rise no further bleeding will switch over to pharm mgmt  Left leg fasciotomy continue hydrogel  Disposition to SNF when INR 2  Spoke with pt daughter yesterday updated condition and dc plans  Fabienne Bruns 11/30/2018 10:25 AM --  Laboratory Lab Results: Recent Labs    11/28/18 0233 11/30/18 0334  WBC 6.9 7.1  HGB 7.6* 8.3*  HCT 25.3* 26.4*  PLT 232 278   BMET No results for input(s): NA, K, CL, CO2, GLUCOSE, BUN, CREATININE, CALCIUM in the last 72 hours.  COAG Lab Results  Component Value Date   INR 1.5 (H) 11/30/2018   INR 1.3 (H) 11/29/2018   INR 1.4 (H) 11/28/2018   No results found for: PTT

## 2018-11-30 NOTE — Progress Notes (Signed)
ANTICOAGULATION CONSULT NOTE - Initial Consult  Pharmacy Consult for warfarin Indication: DVT  Allergies  Allergen Reactions  . Other Diarrhea, Nausea And Vomiting, Anxiety and Other (See Comments)    Unknown pain medication: caused sweating, nervousness, and "Some new pain med given at [facility], but I don't know what it's called." Patient states "it knocked me out and I got sweaty and they gave me benadryl."     Patient Measurements: Height: 5\' 3"  (160 cm) Weight: 205 lb 14.6 oz (93.4 kg) IBW/kg (Calculated) : 52.4  Vital Signs: Temp: 98.4 F (36.9 C) (03/12 0400) Temp Source: Oral (03/12 0400) BP: 114/51 (03/12 0400) Pulse Rate: 62 (03/12 0400)  Labs: Recent Labs    11/28/18 0233 11/29/18 0300 11/30/18 0334  HGB 7.6*  --  8.3*  HCT 25.3*  --  26.4*  PLT 232  --  278  LABPROT 16.9* 16.0* 17.6*  INR 1.4* 1.3* 1.5*    Estimated Creatinine Clearance: 51.3 mL/min (A) (by C-G formula based on SCr of 1.03 mg/dL (H)).   Medical History: Past Medical History:  Diagnosis Date  . Arthritis   . COPD (chronic obstructive pulmonary disease) (HCC)   . Diabetes mellitus without complication (HCC)   . Hypertension      Assessment: 77 yoF on warfarin PTA for hx of recent DVT, leg leg embolectomy, and fasciotomy admitted for bleeding from fasciotomy site. Pt s/p OR on 3/2 for urgent I&D of bleeding. Pt was resumed on warfarin per [facility] MD on 3/5. Home dose was 7.5mg  daily, pt was resumed initially on 5mg  daily with slow trend upward. Pt has had two doses of 7.5mg  on 3/10 and 3/11, now INR beginning to rise slowly from 1.3 to 1.5. Planning to D/C back to SNF once INR >2. H/H stable, no reported bleeding.   Goal of Therapy:  INR 2-3 Monitor platelets by anticoagulation protocol: Yes   Plan:  -Warfarin 7.5mg  PO x1 tonight -Would consider adding DVT prophylactic enoxaparin or unfractionated heparin while INR < 2 -Daily protime  Fredonia Highland, PharmD,  BCPS Clinical Pharmacist [phone] Please check AMION for all [facility] Pharmacy numbers 11/30/2018

## 2018-11-30 NOTE — Progress Notes (Signed)
Physical Therapy Treatment Patient Details Name: Gabriella [name] MRN: [id number] DOB: July 27, 1941 Today's Date: 11/30/2018    History of Present Illness Patient is a 78 year old woman with PMH of HTN, DM2, COPD, arthritis presents s/p IRRIGATION AND DEBRIDEMENT LEFT LOWER EXTREMITY.  Patient recently admitted for sepsis and s/p Left popliteal and tibial embolectomy, 4 compartment fasciotomy, vein patch angioplasty left popliteal artery 11/05/2018 and subsequently d/c to SNF.    PT Comments    Patient is progressing well towards their physical therapy goals with encouragement. Shows progression in strength and activity tolerance, ambulating 50 feet with walker and min assist. HR peak 117 bpm. Pt remains at high risk for falls based on decreased gait speed and safety awareness. SNF remains appropriate.     Follow Up Recommendations  SNF     Equipment Recommendations  None recommended by PT    Recommendations for Other Services       Precautions / Restrictions Precautions Precautions: Fall Restrictions Weight Bearing Restrictions: No    Mobility  Bed Mobility Overal bed mobility: Needs Assistance Bed Mobility: Supine to Sit     Supine to sit: Min guard        Transfers Overall transfer level: Needs assistance Equipment used: Rolling walker (2 wheeled) Transfers: Sit to/from Stand Sit to Stand: Min assist         General transfer comment: min assist to lift  Ambulation/Gait Ambulation/Gait assistance: Min assist Gait Distance (Feet): 50 Feet Assistive device: Rolling walker (2 wheeled) Gait Pattern/deviations: Decreased stride length;Step-to pattern;Trunk flexed;Step-through pattern Gait velocity: decreased   General Gait Details: Cues for upright posture and activity pacing   Stairs             Wheelchair Mobility    Modified Rankin (Stroke Patients Only)       Balance Overall balance assessment: Needs assistance Sitting-balance support: Feet  supported;No upper extremity supported Sitting balance-Leahy Scale: Good     Standing balance support: Single extremity supported Standing balance-Leahy Scale: Poor Standing balance comment: reliance on Rw                            Cognition Arousal/Alertness: Awake/alert Behavior During Therapy: WFL for tasks assessed/performed Overall Cognitive Status: Impaired/Different from baseline Area of Impairment: Memory;Awareness;Attention                   Current Attention Level: Selective Memory: Decreased short-term memory     Awareness: Emergent          Exercises General Exercises - Lower Extremity Heel Slides: 10 reps;Both;Supine Straight Leg Raises: 10 reps;Both;Supine    General Comments        Pertinent Vitals/Pain Pain Assessment: Faces Faces Pain Scale: Hurts a little bit Pain Location: LLE Pain Descriptors / Indicators: Aching;Sore Pain Intervention(s): Monitored during session    Home Living                      Prior Function            PT Goals (current goals can now be found in the care plan section) Acute Rehab PT Goals Patient Stated Goal: get back home to [facility] to [facility] PT Goal Formulation: With patient Time For Goal Achievement: 12/08/18 Potential to Achieve Goals: Fair Progress towards PT goals: Progressing toward goals    Frequency    Min 2X/week      PT Plan Frequency needs to be updated  Co-evaluation              AM-PAC PT "6 Clicks" Mobility   Outcome Measure  Help needed turning from your back to your side while in a flat bed without using bedrails?: None Help needed moving from lying on your back to sitting on the side of a flat bed without using bedrails?: A Little Help needed moving to and from a bed to a chair (including a wheelchair)?: A Little Help needed standing up from a chair using your arms (e.g., wheelchair or bedside chair)?: A Little Help needed to walk in hospital room?: A  Little Help needed climbing 3-5 steps with a railing? : Total 6 Click Score: 17    End of Session   Activity Tolerance: Patient tolerated treatment well Patient left: in chair;with call bell/phone within reach;with chair alarm set Nurse Communication: Mobility status PT Visit Diagnosis: Unsteadiness on feet (R26.81);Muscle weakness (generalized) (M62.81);Difficulty in walking, not elsewhere classified (R26.2);Pain Pain - Right/Left: Left Pain - part of body: Leg     Time: 1134-1150 PT Time Calculation (min) (ACUTE ONLY): 16 min  Charges:  $Gait Training: 8-22 mins                    Laurina Bustle, PT, DPT Acute Rehabilitation Services Pager [phone] Office [phone]    Vanetta Mulders 11/30/2018, 2:04 PM

## 2018-11-30 NOTE — Clinical Social Work Note (Signed)
[facility] is able to offer patient a bed when stable. Daughter aware. Left voicemail for [facility] social worker.  Charlynn Court, CSW [phone]

## 2018-12-01 LAB — GLUCOSE, CAPILLARY
Glucose-Capillary: 61 mg/dL — ABNORMAL LOW (ref 70–99)
Glucose-Capillary: 89 mg/dL (ref 70–99)
Glucose-Capillary: 78 mg/dL (ref 70–99)
Glucose-Capillary: 108 mg/dL — ABNORMAL HIGH (ref 70–99)

## 2018-12-01 LAB — CBC
HCT: 26.5 % — ABNORMAL LOW (ref 36.0–46.0)
Hemoglobin: 8.3 g/dL — ABNORMAL LOW (ref 12.0–15.0)
MCH: 28.1 pg (ref 26.0–34.0)
MCHC: 31.3 g/dL (ref 30.0–36.0)
MCV: 89.8 fL (ref 80.0–100.0)
Platelets: 278 10*3/uL (ref 150–400)
RBC: 2.95 MIL/uL — ABNORMAL LOW (ref 3.87–5.11)
RDW: 12.7 % (ref 11.5–15.5)
WBC: 7 10*3/uL (ref 4.0–10.5)
nRBC: 0 % (ref 0.0–0.2)

## 2018-12-01 LAB — PROTIME-INR
INR: 1.7 — ABNORMAL HIGH (ref 0.8–1.2)
Prothrombin Time: 20.1 seconds — ABNORMAL HIGH (ref 11.4–15.2)

## 2018-12-01 MED ORDER — WARFARIN SODIUM 7.5 MG PO TABS
7.5000 mg | ORAL_TABLET | Freq: Once | ORAL | Status: AC
  Administered 2018-12-01: 7.5 mg via ORAL
  Filled 2018-12-01: qty 1

## 2018-12-01 NOTE — Progress Notes (Signed)
[facility] [place]  Subjective  - feels ok   Objective (!) 116/51 (!) 57 97.6 F (36.4 C) (Axillary) 13 98%  Intake/Output Summary (Last 24 hours) at 12/01/2018 0844 Last data filed at 12/01/2018 0100 Gross per 24 hour  Intake 243 ml  Output 550 ml  Net -307 ml   Left foot 2+ AT pulse Left lateral fasciotomy continues to granulate   Assessment/Planning: Embolus left leg.  INR trending up should be on stable warfarin dose by Monday  D/c to SNF Monday  Will need home INR checks and wound care set up  Will arrange follow up with me in one month  Fabienne Bruns 12/01/2018 8:44 AM --  Laboratory Lab Results: Recent Labs    11/30/18 0334 12/01/18 0242  WBC 7.1 7.0  HGB 8.3* 8.3*  HCT 26.4* 26.5*  PLT 278 278   BMET No results for input(s): NA, K, CL, CO2, GLUCOSE, BUN, CREATININE, CALCIUM in the last 72 hours.  COAG Lab Results  Component Value Date   INR 1.7 (H) 12/01/2018   INR 1.5 (H) 11/30/2018   INR 1.3 (H) 11/29/2018   No results found for: PTT

## 2018-12-01 NOTE — Progress Notes (Signed)
Consulted with pharmacy regarding nighttime medications related to insomnia. Prior nurses report episodes of confusion at nighttime.  Pharmacy recommended to hold Benadryl and Rozcem tonight. Will see about getting these medications being d/c tomorrow. Will monitor patient for closely tonight for episodes of insomnia and/or confusion.

## 2018-12-01 NOTE — Progress Notes (Signed)
ANTICOAGULATION CONSULT NOTE - Follow-Up Consult  Pharmacy Consult for warfarin Indication: DVT  Allergies  Allergen Reactions  . Other Diarrhea, Nausea And Vomiting, Anxiety and Other (See Comments)    Unknown pain medication: caused sweating, nervousness, and "Some new pain med given at [facility], but I don't know what it's called." Patient states "it knocked me out and I got sweaty and they gave me benadryl."     Patient Measurements: Height: 5\' 3"  (160 cm) Weight: 205 lb 14.6 oz (93.4 kg) IBW/kg (Calculated) : 52.4  Vital Signs: Temp: 97.6 F (36.4 C) (03/13 0725) Temp Source: Axillary (03/13 0725) BP: 116/51 (03/13 0725) Pulse Rate: 57 (03/13 0311)  Labs: Recent Labs    11/29/18 0300 11/30/18 0334 12/01/18 0242  HGB  --  8.3* 8.3*  HCT  --  26.4* 26.5*  PLT  --  278 278  LABPROT 16.0* 17.6* 20.1*  INR 1.3* 1.5* 1.7*    Estimated Creatinine Clearance: 51.3 mL/min (A) (by C-G formula based on SCr of 1.03 mg/dL (H)).   Medical History: Past Medical History:  Diagnosis Date  . Arthritis   . COPD (chronic obstructive pulmonary disease) (HCC)   . Diabetes mellitus without complication (HCC)   . Hypertension      Assessment: 77 yoF on warfarin PTA for hx of recent DVT, leg leg embolectomy, and fasciotomy admitted for bleeding from fasciotomy site. Pt s/p OR on 3/2 for urgent I&D of bleeding. Pt was resumed on warfarin per [facility] MD on 3/5. Home dose was 7.5mg  daily, pt was resumed initially on 5mg  daily with slow trend upward. INR now trending up to 1.7 after several 7.5mg  doses. H/H stable, planning for discharge to [facility] Monday.   Goal of Therapy:  INR 2-3 Monitor platelets by anticoagulation protocol: Yes   Plan:  -Warfarin 7.5mg  PO x1 tonight -Would consider adding DVT prophylactic enoxaparin or unfractionated heparin while INR < 2 -Daily protime  Fredonia Highland, PharmD, BCPS Clinical Pharmacist [phone] Please check AMION for all [facility]  numbers 12/01/2018

## 2018-12-01 NOTE — Progress Notes (Signed)
Inpatient Diabetes Program Recommendations  AACE/ADA: New Consensus Statement on Inpatient Glycemic Control (2015)  Target Ranges:  Prepandial:   less than 140 mg/dL      Peak postprandial:   less than 180 mg/dL (1-2 hours)      Critically ill patients:  140 - 180 mg/dL   Lab Results  Component Value Date   GLUCAP 61 (L) 12/01/2018   HGBA1C 11.5 (H) 11/02/2018    Review of Glycemic Control  Diabetes history: Type 2 Outpatient Diabetes medications: Basaglar 22 units every HS, Novolog 2-10 units TID, Metformin Current orders for Inpatient glycemic control: Lantus 22 units every HS, Novolog SENSITIVE TID & HS, Metformin  Inpatient Diabetes Program Recommendations:   Noted that CBG this am was 61 mg/dl. Recommend decreasing Lantus to 18 units daily (less 20% of home dose) and/or decrease Novolog correction scale to SENSITIVE (0-9 units) TID  & HS.  Smith Mince RN BSN CDE Diabetes Coordinator Pager: [phone]  8am-5pm

## 2018-12-01 NOTE — TOC Progression Note (Signed)
Transition of Care [facility]) - Progression Note    Patient Details  Name: Gabriella [name] MRN: [id number] Date of Birth: Feb 15, 1941  Transition of Care [facility]) CM/SW Contact  Margarito Liner, LCSW Phone Number: 12/01/2018, 10:09 AM  Clinical Narrative: Patient and [facility] admissions coordinator are aware of plan for discharge on Monday. Readmission Prevention screening complete. Patient reports that [facility] sets up and transports her to all appointments. She did not feel the need for medication education by the pharmacist. She did ask if the attending MD would continue her prn pain medications after discharge.  Expected Discharge Plan: Skilled Nursing Facility(From [facility])    Expected Discharge Plan and Services Expected Discharge Plan: Skilled Nursing Facility(From [facility]) Discharge Planning Services: CM Consult                               Social Determinants of Health (SDOH) Interventions    Readmission Risk Interventions 30 Day Unplanned Readmission Risk Score     ED to Hosp-Admission (Current) from 11/20/2018 in [facility] 2C CV PROGRESSIVE CARE  30 Day Unplanned Readmission Risk Score (%)  34 Filed at 12/01/2018 0801     This score is the patient's risk of an unplanned readmission within 30 days of being discharged (0 -100%). The score is based on dignosis, age, lab data, medications, orders, and past utilization.   Low:  0-14.9   Medium: 15-21.9   High: 22-29.9   Extreme: 30 and above       Readmission Risk Prevention Plan 12/01/2018  Transportation Screening Complete  Medication Review [employer]) Patient Refused  PCP or Specialist appointment within 3-5 days of discharge Complete  HRI or Home Care Consult Not Complete  HRI or Home Care Consult Pt Refusal Comments Patient will go to SNF.  SW Recovery Care/Counseling Consult Patient refused  Palliative Care Screening Not Applicable  Skilled Nursing Facility Complete

## 2018-12-02 LAB — CBC
HCT: 27.1 % — ABNORMAL LOW (ref 36.0–46.0)
Hemoglobin: 8.7 g/dL — ABNORMAL LOW (ref 12.0–15.0)
MCH: 28.7 pg (ref 26.0–34.0)
MCHC: 32.1 g/dL (ref 30.0–36.0)
MCV: 89.4 fL (ref 80.0–100.0)
Platelets: 270 10*3/uL (ref 150–400)
RBC: 3.03 MIL/uL — ABNORMAL LOW (ref 3.87–5.11)
RDW: 13 % (ref 11.5–15.5)
WBC: 6.4 10*3/uL (ref 4.0–10.5)
nRBC: 0 % (ref 0.0–0.2)

## 2018-12-02 LAB — GLUCOSE, CAPILLARY
Glucose-Capillary: 98 mg/dL (ref 70–99)
Glucose-Capillary: 101 mg/dL — ABNORMAL HIGH (ref 70–99)
Glucose-Capillary: 111 mg/dL — ABNORMAL HIGH (ref 70–99)
Glucose-Capillary: 154 mg/dL — ABNORMAL HIGH (ref 70–99)
Glucose-Capillary: 140 mg/dL — ABNORMAL HIGH (ref 70–99)

## 2018-12-02 LAB — PROTIME-INR
INR: 2 — ABNORMAL HIGH (ref 0.8–1.2)
Prothrombin Time: 22.1 seconds — ABNORMAL HIGH (ref 11.4–15.2)

## 2018-12-02 MED ORDER — INSULIN GLARGINE 100 UNIT/ML ~~LOC~~ SOLN
11.0000 [IU] | Freq: Every day | SUBCUTANEOUS | Status: DC
  Administered 2018-12-02 – 2018-12-04 (×3): 11 [IU] via SUBCUTANEOUS
  Filled 2018-12-02 (×3): qty 0.11

## 2018-12-02 MED ORDER — WARFARIN SODIUM 7.5 MG PO TABS
7.5000 mg | ORAL_TABLET | Freq: Every day | ORAL | Status: DC
  Administered 2018-12-02 – 2018-12-04 (×3): 7.5 mg via ORAL
  Filled 2018-12-02 (×3): qty 1

## 2018-12-02 NOTE — Plan of Care (Signed)

## 2018-12-02 NOTE — Progress Notes (Signed)
ANTICOAGULATION CONSULT NOTE - Follow-Up Consult  Pharmacy Consult for warfarin Indication: DVT  Allergies  Allergen Reactions  . Other Diarrhea, Nausea And Vomiting, Anxiety and Other (See Comments)    Unknown pain medication: caused sweating, nervousness, and "Some new pain med given at [facility], but I don't know what it's called." Patient states "it knocked me out and I got sweaty and they gave me benadryl."     Patient Measurements: Height: 5\' 3"  (160 cm) Weight: 205 lb 14.6 oz (93.4 kg) IBW/kg (Calculated) : 52.4  Vital Signs: Temp: 97.7 F (36.5 C) (03/14 0744) Temp Source: Oral (03/14 0744) BP: 125/58 (03/14 0744) Pulse Rate: 61 (03/14 0744)  Labs: Recent Labs    11/30/18 0334 12/01/18 0242 12/02/18 0218  HGB 8.3* 8.3* 8.7*  HCT 26.4* 26.5* 27.1*  PLT 278 278 270  LABPROT 17.6* 20.1* 22.1*  INR 1.5* 1.7* 2.0*    Estimated Creatinine Clearance: 51.3 mL/min (A) (by C-G formula based on SCr of 1.03 mg/dL (H)).   Medical History: Past Medical History:  Diagnosis Date  . Arthritis   . COPD (chronic obstructive pulmonary disease) (HCC)   . Diabetes mellitus without complication (HCC)   . Hypertension      Assessment: 77 yoF on warfarin PTA for hx of recent DVT, leg leg embolectomy, and fasciotomy admitted for bleeding from fasciotomy site. Pt s/p OR on 3/2 for urgent I&D of bleeding. Pt was resumed on warfarin per [facility] MD on 3/5. Home dose was 7.5mg  daily  INR trended up to 2.0 this morning, H/H improved, planning for discharge to [facility] Monday.   Goal of Therapy:  INR 2-3 Monitor platelets by anticoagulation protocol: Yes   Plan:  -Warfarin 7.5mg  PO daily for now -Daily protime  Sheppard Coil PharmD., BCPS Clinical Pharmacist 12/02/2018 10:06 AM

## 2018-12-02 NOTE — Progress Notes (Signed)
Patient ID: Gabriella [name], female   DOB: Nov 02, 1940, 78 y.o.   MRN: [id number]  Progress Note    12/02/2018 8:21 AM 12 Days Post-Op  Subjective: Comfortable.  Some left leg soreness   Vitals:   12/02/18 0348 12/02/18 0744  BP: (!) 111/47 (!) 125/58  Pulse: 64 61  Resp: 16 18  Temp: 98.2 F (36.8 C) 97.7 F (36.5 C)  SpO2: 98% 96%   Physical Exam: Ace wrap intact over left calf.  Palpable popliteal pulse  CBC    Component Value Date/Time   WBC 6.4 12/02/2018 0218   RBC 3.03 (L) 12/02/2018 0218   HGB 8.7 (L) 12/02/2018 0218   HCT 27.1 (L) 12/02/2018 0218   PLT 270 12/02/2018 0218   MCV 89.4 12/02/2018 0218   MCH 28.7 12/02/2018 0218   MCHC 32.1 12/02/2018 0218   RDW 13.0 12/02/2018 0218   LYMPHSABS 2.8 11/20/2018 1907   MONOABS 0.5 11/20/2018 1907   EOSABS 0.5 11/20/2018 1907   BASOSABS 0.1 11/20/2018 1907    BMET    Component Value Date/Time   NA 134 (L) 11/23/2018 0316   K 4.6 11/23/2018 0316   CL 100 11/23/2018 0316   CO2 28 11/23/2018 0316   GLUCOSE 108 (H) 11/23/2018 0316   BUN 27 (H) 11/23/2018 0316   CREATININE 1.03 (H) 11/23/2018 0316   CALCIUM 8.0 (L) 11/23/2018 0316   GFRNONAA 53 (L) 11/23/2018 0316   GFRAA >60 11/23/2018 0316    INR    Component Value Date/Time   INR 2.0 (H) 12/02/2018 0218     Intake/Output Summary (Last 24 hours) at 12/02/2018 0821 Last data filed at 12/02/2018 0746 Gross per 24 hour  Intake 686 ml  Output 1550 ml  Net -864 ml     Assessment/Plan:  78 y.o. female stable overall.  INR today is 2.0.  It was 1.7 yesterday.  Plan return to skilled nursing facility on Monday when INR therapeutic     Larina Earthly, MD [facility] [phone] 12/02/2018 8:21 AM

## 2018-12-02 NOTE — Progress Notes (Signed)
Pt had an assistive fall, NT assisting pt to bedside commode, pt lost balance NT caught pt and assisted her to floor. No injuries noted. Paged dr Early regarding the assistive fall. V/s stable. No new orders rec'd will continue to monitor.

## 2018-12-03 LAB — GLUCOSE, CAPILLARY
GLUCOSE-CAPILLARY: 128 mg/dL — AB (ref 70–99)
GLUCOSE-CAPILLARY: 128 mg/dL — AB (ref 70–99)
Glucose-Capillary: 112 mg/dL — ABNORMAL HIGH (ref 70–99)
Glucose-Capillary: 110 mg/dL — ABNORMAL HIGH (ref 70–99)

## 2018-12-03 LAB — CBC
HEMATOCRIT: 27.4 % — AB (ref 36.0–46.0)
Hemoglobin: 8.8 g/dL — ABNORMAL LOW (ref 12.0–15.0)
MCH: 28.8 pg (ref 26.0–34.0)
MCHC: 32.1 g/dL (ref 30.0–36.0)
MCV: 89.5 fL (ref 80.0–100.0)
Platelets: 257 10*3/uL (ref 150–400)
RBC: 3.06 MIL/uL — AB (ref 3.87–5.11)
RDW: 12.9 % (ref 11.5–15.5)
WBC: 7.1 10*3/uL (ref 4.0–10.5)
nRBC: 0 % (ref 0.0–0.2)

## 2018-12-03 LAB — PROTIME-INR
INR: 2.3 — ABNORMAL HIGH (ref 0.8–1.2)
Prothrombin Time: 25.3 seconds — ABNORMAL HIGH (ref 11.4–15.2)

## 2018-12-03 NOTE — Progress Notes (Signed)
ANTICOAGULATION CONSULT NOTE - Follow-Up Consult  Pharmacy Consult for warfarin Indication: DVT  Allergies  Allergen Reactions  . Other Diarrhea, Nausea And Vomiting, Anxiety and Other (See Comments)    Unknown pain medication: caused sweating, nervousness, and "Some new pain med given at [facility], but I don't know what it's called." Patient states "it knocked me out and I got sweaty and they gave me benadryl."     Patient Measurements: Height: 5\' 3"  (160 cm) Weight: 205 lb 14.6 oz (93.4 kg) IBW/kg (Calculated) : 52.4  Vital Signs: Temp: 97.6 F (36.4 C) (03/15 0820) Temp Source: Oral (03/15 0820) BP: 115/58 (03/15 0820) Pulse Rate: 68 (03/15 0820)  Labs: Recent Labs    12/01/18 0242 12/02/18 0218 12/03/18 0234  HGB 8.3* 8.7* 8.8*  HCT 26.5* 27.1* 27.4*  PLT 278 270 257  LABPROT 20.1* 22.1* 25.3*  INR 1.7* 2.0* 2.3*    Estimated Creatinine Clearance: 51.3 mL/min (A) (by C-G formula based on SCr of 1.03 mg/dL (H)).   Medical History: Past Medical History:  Diagnosis Date  . Arthritis   . COPD (chronic obstructive pulmonary disease) (HCC)   . Diabetes mellitus without complication (HCC)   . Hypertension      Assessment: 77 yoF on warfarin PTA for hx of recent DVT, leg leg embolectomy, and fasciotomy admitted for bleeding from fasciotomy site. Pt s/p OR on 3/2 for urgent I&D of bleeding. Pt was resumed on warfarin per [facility] MD on 3/5. Home dose was 7.5mg  daily  INR trended up to 2.3 this morning, H/H improved, planning for discharge to [facility] Monday.  Goal of Therapy:  INR 2-3 Monitor platelets by anticoagulation protocol: Yes   Plan:  -Continue Warfarin 7.5mg  PO daily for now -Daily protime  Sheppard Coil PharmD., BCPS Clinical Pharmacist 12/03/2018 10:03 AM

## 2018-12-03 NOTE — Progress Notes (Signed)
Pt walked only a few steps inside the room. Sec to constant pain on the left leg. Refused to walk further more.

## 2018-12-03 NOTE — Progress Notes (Signed)
Patient ID: Gabriella [name], female   DOB: 1941-06-23, 78 y.o.   MRN: [id number]  Progress Note    12/03/2018 7:51 AM 13 Days Post-Op  Subjective: Comfortable.  There was some soreness in her left calf.   Vitals:   12/02/18 2355 12/03/18 0402  BP: (!) 114/47 (!) 113/50  Pulse: 66 69  Resp: 16 19  Temp: 98.5 F (36.9 C) 97.7 F (36.5 C)  SpO2: 94% 95%   Physical Exam: Dressing removed.  Lateral fasciotomy wound with excellent granulating base.  Does have some separating eschar around the periphery.  CBC    Component Value Date/Time   WBC 7.1 12/03/2018 0234   RBC 3.06 (L) 12/03/2018 0234   HGB 8.8 (L) 12/03/2018 0234   HCT 27.4 (L) 12/03/2018 0234   PLT 257 12/03/2018 0234   MCV 89.5 12/03/2018 0234   MCH 28.8 12/03/2018 0234   MCHC 32.1 12/03/2018 0234   RDW 12.9 12/03/2018 0234   LYMPHSABS 2.8 11/20/2018 1907   MONOABS 0.5 11/20/2018 1907   EOSABS 0.5 11/20/2018 1907   BASOSABS 0.1 11/20/2018 1907    BMET    Component Value Date/Time   NA 134 (L) 11/23/2018 0316   K 4.6 11/23/2018 0316   CL 100 11/23/2018 0316   CO2 28 11/23/2018 0316   GLUCOSE 108 (H) 11/23/2018 0316   BUN 27 (H) 11/23/2018 0316   CREATININE 1.03 (H) 11/23/2018 0316   CALCIUM 8.0 (L) 11/23/2018 0316   GFRNONAA 53 (L) 11/23/2018 0316   GFRAA >60 11/23/2018 0316    INR    Component Value Date/Time   INR 2.3 (H) 12/03/2018 0234     Intake/Output Summary (Last 24 hours) at 12/03/2018 0751 Last data filed at 12/02/2018 1636 Gross per 24 hour  Intake 120 ml  Output -  Net 120 ml     Assessment/Plan:  78 y.o. female stable overall.  INR 2.3.  Plan transfer back to skilled nursing facility tomorrow     Larina Earthly, MD [facility] [phone] 12/03/2018 7:51 AM

## 2018-12-03 NOTE — Progress Notes (Signed)
Occupational Therapy Treatment Patient Details Name: Gabriella [name] MRN: [id number] DOB: [birth year] Today's Date: 12/03/2018    History of present illness Patient is a 78 year old woman with PMH of HTN, DM2, COPD, arthritis presents s/p IRRIGATION AND DEBRIDEMENT LEFT LOWER EXTREMITY.  Patient recently admitted for sepsis and s/p Left popliteal and tibial embolectomy, 4 compartment fasciotomy, vein patch angioplasty left popliteal artery 11/05/2018 and subsequently d/c to SNF.   OT comments  Pt. Able to complete bsc transfer and bed mobility this session.  Intermittent cues required for hand placement and sequencing during pivotal movements.  Note d/c snf likely tomorrow.   Follow Up Recommendations  SNF;Supervision/Assistance - 24 hour    Equipment Recommendations  Other (comment)    Recommendations for Other Services      Precautions / Restrictions Precautions Precautions: Fall Restrictions Weight Bearing Restrictions: No       Mobility Bed Mobility Overal bed mobility: Needs Assistance Bed Mobility: Sit to Supine       Sit to supine: Min guard   General bed mobility comments: no physical assistance required for sit/supine  Transfers Overall transfer level: Needs assistance Equipment used: None Transfers: Sit to/from [employer] Sit to Stand: Min guard Stand pivot transfers: Min guard       General transfer comment: pt. stand pivot from recliner to bsc, then bsc to eob to lay down.  intermittent cues for hand placement during transfers    Balance                                           ADL either performed or assessed with clinical judgement   ADL Overall ADL's : Needs assistance/impaired                         Toilet Transfer: Min guard;BSC;Stand-pivot   Toileting- Clothing Manipulation and Hygiene: Set up;Sitting/lateral lean       Functional mobility during ADLs: Min guard;Cueing for sequencing General  ADL Comments: pt. overall moving well, intermittent cues for hand placement during pivot transfers pt. saying "i know, i know i got this under control".  reviewed energy conservation strategies also. pt. reports she will lay down and rest when she feels like she needs to     Vision       Perception     Praxis      Cognition Arousal/Alertness: Awake/alert Behavior During Therapy: WFL for tasks assessed/performed Overall Cognitive Status: Within Functional Limits for tasks assessed                                          Exercises     Shoulder Instructions       General Comments      Pertinent Vitals/ Pain       Pain Assessment: No/denies pain  Home Living                                          Prior Functioning/Environment              Frequency  Min 2X/week        Progress Toward Goals  OT Goals(current goals  can now be found in the care plan section)  Progress towards OT goals: Progressing toward goals     Plan Discharge plan remains appropriate    Co-evaluation                 AM-PAC OT "6 Clicks" Daily Activity     Outcome Measure   Help from another person eating meals?: None Help from another person taking care of personal grooming?: A Little Help from another person toileting, which includes using toliet, bedpan, or urinal?: A Lot Help from another person bathing (including washing, rinsing, drying)?: A Lot Help from another person to put on and taking off regular upper body clothing?: A Little Help from another person to put on and taking off regular lower body clothing?: A Lot 6 Click Score: 16    End of Session    OT Visit Diagnosis: Unsteadiness on feet (R26.81);Muscle weakness (generalized) (M62.81) Pain - Right/Left: Left Pain - part of body: Leg   Activity Tolerance Patient tolerated treatment well   Patient Left in bed;with call bell/phone within reach;with bed alarm set   Nurse  Communication Other (comment)(pt. requesting pure wick.  spoke to CNA who reports pt. not to wear during the day. reviewed with pt. rec. for using call bell to notify when she needs to use the b.room. also disucssed this would be more helpful in preparation for next venue of care)        Time: 6701-4103 OT Time Calculation (min): 8 min  Charges: OT General Charges $OT Visit: 1 Visit OT Treatments $Self Care/Home Management : 8-22 mins   Robet Leu, COTA/L 12/03/2018, 10:37 AM

## 2018-12-03 NOTE — Progress Notes (Signed)
Dressing to left leg changed by MD, tolerated well.

## 2018-12-04 LAB — CBC
HCT: 27.8 % — ABNORMAL LOW (ref 36.0–46.0)
Hemoglobin: 9 g/dL — ABNORMAL LOW (ref 12.0–15.0)
MCH: 28.8 pg (ref 26.0–34.0)
MCHC: 32.4 g/dL (ref 30.0–36.0)
MCV: 89.1 fL (ref 80.0–100.0)
Platelets: 240 10*3/uL (ref 150–400)
RBC: 3.12 MIL/uL — ABNORMAL LOW (ref 3.87–5.11)
RDW: 13 % (ref 11.5–15.5)
WBC: 7.2 10*3/uL (ref 4.0–10.5)
nRBC: 0 % (ref 0.0–0.2)

## 2018-12-04 LAB — GLUCOSE, CAPILLARY
Glucose-Capillary: 111 mg/dL — ABNORMAL HIGH (ref 70–99)
Glucose-Capillary: 131 mg/dL — ABNORMAL HIGH (ref 70–99)
Glucose-Capillary: 99 mg/dL (ref 70–99)

## 2018-12-04 LAB — PROTIME-INR
INR: 2.6 — ABNORMAL HIGH (ref 0.8–1.2)
PROTHROMBIN TIME: 27.1 s — AB (ref 11.4–15.2)

## 2018-12-04 NOTE — Care Management Important Message (Signed)
Important Message  Patient Details  Name: Gabriella [name] MRN: [id number] Date of Birth: 1940/10/30   Medicare Important Message Given:  Yes    Oralia Rud Epiphany Seltzer 12/04/2018, 4:07 PM

## 2018-12-04 NOTE — Progress Notes (Signed)
ANTICOAGULATION CONSULT NOTE - Follow-Up Consult  Pharmacy Consult for warfarin Indication: DVT  Allergies  Allergen Reactions  . Other Diarrhea, Nausea And Vomiting, Anxiety and Other (See Comments)    Unknown pain medication: caused sweating, nervousness, and "Some new pain med given at [facility], but I don't know what it's called." Patient states "it knocked me out and I got sweaty and they gave me benadryl."     Patient Measurements: Height: 5\' 3"  (160 cm) Weight: 205 lb 14.6 oz (93.4 kg) IBW/kg (Calculated) : 52.4  Vital Signs: Temp: 97.9 F (36.6 C) (03/16 0400) Temp Source: Oral (03/16 0400) BP: 147/63 (03/16 0400) Pulse Rate: 63 (03/16 0400)  Labs: Recent Labs    12/02/18 0218 12/03/18 0234 12/04/18 0214  HGB 8.7* 8.8* 9.0*  HCT 27.1* 27.4* 27.8*  PLT 270 257 240  LABPROT 22.1* 25.3* 27.1*  INR 2.0* 2.3* 2.6*    Estimated Creatinine Clearance: 51.3 mL/min (A) (by C-G formula based on SCr of 1.03 mg/dL (H)).   Medical History: Past Medical History:  Diagnosis Date  . Arthritis   . COPD (chronic obstructive pulmonary disease) (HCC)   . Diabetes mellitus without complication (HCC)   . Hypertension      Assessment: 77 yoF on warfarin PTA for hx of recent DVT, leg leg embolectomy, and fasciotomy admitted for bleeding from fasciotomy site. Pt s/p OR on 3/2 for urgent I&D of bleeding. Pt was resumed on warfarin per [facility] MD on 3/5. Home dose was 7.5mg  daily  INR up to 2.6 today, stable on 7.5mg  daily. Planning to discharge to SNF today, would continue previous home dose of 7.5mg  daily.  Goal of Therapy:  INR 2-3 Monitor platelets by anticoagulation protocol: Yes   Plan:  -Continue Warfarin 7.5mg  PO daily for now -Daily protime   Fredonia Highland, PharmD, BCPS Clinical Pharmacist [phone] Please check AMION for all [facility] numbers 12/04/2018

## 2018-12-04 NOTE — Care Management (Addendum)
1516: CM informed by Marylu Lund with [facility] that pt is now in agreement to discharge to facility.  CSW aware and will facilitate discharge  1130:: CM spoke with Marylu Lund, [facility] nor pts daughter are in alignment with pt returning to the home - secondary to safety concerns as pt has fallen multiple times in the past and she does not have 24 hour supersion.  Pts daughter to speak with mother regarding discharging back to SNF.  [facility] will follow up with CM regarding final disposition.   1100:  CM left Butler Denmark with [facility] [phone]) VM regarding change in discharge plan from SNF to [facility] per pts choice and recommendation by [facility] therapy.  [facility] will have to agree to all discharge plans before disposition can be finalized.

## 2018-12-04 NOTE — Progress Notes (Signed)
Physical Therapy Treatment Patient Details Name: Gabriella [name] MRN: [id number] DOB: 1940-10-16 Today's Date: 12/04/2018    History of Present Illness Patient is a 78 year old woman with PMH of HTN, DM2, COPD, arthritis presents s/p IRRIGATION AND DEBRIDEMENT LEFT LOWER EXTREMITY.  Patient recently admitted for sepsis and s/p Left popliteal and tibial embolectomy, 4 compartment fasciotomy, vein patch angioplasty left popliteal artery 11/05/2018 and subsequently d/c to SNF.    PT Comments    Pt received in recliner, motivated and eager to participate in therapy. She required supervision transfers and min guard assist ambulation 200 feet with RW. She demonstrates significant improvement in mobility. Her desire is to now return home with HHPT. She reports her daughter and daughter's fiance are able to provide 24-hour assist. She also has [facility] 3 days/week and attends [facility]. She has all needed home DME. Discharge recommendations updated.    Follow Up Recommendations  Home health PT;Supervision/Assistance - 24 hour     Equipment Recommendations  None recommended by PT    Recommendations for Other Services       Precautions / Restrictions Precautions Precautions: Fall    Mobility  Bed Mobility Overal bed mobility: Modified Independent             General bed mobility comments: no physical assistance required for sit/supine, +rails but pt reports having hospital bed at home  Transfers Overall transfer level: Needs assistance Equipment used: Rolling walker (2 wheeled) Transfers: Sit to/from [employer] Sit to Stand: Supervision Stand pivot transfers: Supervision       General transfer comment: supervision for safety  Ambulation/Gait Ambulation/Gait assistance: Min guard Gait Distance (Feet): 200 Feet Assistive device: Rolling walker (2 wheeled) Gait Pattern/deviations: Decreased stride length;Step-to pattern;Trunk flexed;Step-through pattern Gait velocity:  WFL Gait velocity interpretation: 1.31 - 2.62 ft/sec, indicative of limited community ambulator General Gait Details: steady gait, supervision for safety, max HR 121   Stairs             Wheelchair Mobility    Modified Rankin (Stroke Patients Only)       Balance Overall balance assessment: Needs assistance Sitting-balance support: Feet supported;No upper extremity supported Sitting balance-Leahy Scale: Good     Standing balance support: Bilateral upper extremity supported;During functional activity Standing balance-Leahy Scale: Poor Standing balance comment: reliance on RW                            Cognition Arousal/Alertness: Awake/alert Behavior During Therapy: WFL for tasks assessed/performed Overall Cognitive Status: Within Functional Limits for tasks assessed                                        Exercises      General Comments        Pertinent Vitals/Pain Pain Assessment: No/denies pain    Home Living                      Prior Function            PT Goals (current goals can now be found in the care plan section) Acute Rehab PT Goals Patient Stated Goal: get back home to go to [facility] PT Goal Formulation: With patient Time For Goal Achievement: 12/08/18 Potential to Achieve Goals: Good Progress towards PT goals: Progressing toward goals    Frequency    Min  3X/week      PT Plan Discharge plan needs to be updated;Frequency needs to be updated    Co-evaluation              AM-[facility] PT "6 Clicks" Mobility   Outcome Measure  Help needed turning from your back to your side while in a flat bed without using bedrails?: None Help needed moving from lying on your back to sitting on the side of a flat bed without using bedrails?: A Little Help needed moving to and from a bed to a chair (including a wheelchair)?: A Little Help needed standing up from a chair using your arms (e.g., wheelchair or bedside  chair)?: None Help needed to walk in hospital room?: A Little Help needed climbing 3-5 steps with a railing? : A Lot 6 Click Score: 19    End of Session Equipment Utilized During Treatment: Gait belt Activity Tolerance: Patient tolerated treatment well Patient left: in chair;with call bell/phone within reach;with chair alarm set Nurse Communication: Mobility status PT Visit Diagnosis: Unsteadiness on feet (R26.81);Muscle weakness (generalized) (M62.81);Difficulty in walking, not elsewhere classified (R26.2);Pain     Time: 8466-5993 PT Time Calculation (min) (ACUTE ONLY): 16 min  Charges:  $Gait Training: 8-22 mins                     Aida Raider, [place]  Office # [phone] Pager [phone]    Ilda Foil 12/04/2018, 9:45 AM

## 2018-12-04 NOTE — Progress Notes (Addendum)
  Progress Note    12/04/2018 8:37 AM 14 Days Post-Op  Subjective:  Patient now unwilling to discharge to [facility] and would prefer home with [facility].   Vitals:   12/04/18 0400 12/04/18 0818  BP: (!) 147/63   Pulse: 63   Resp: 16   Temp: 97.9 F (36.6 C)   SpO2: 97% 97%   Physical Exam: Lungs:  Non labored Incisions:  Dressing left in place; palpable L ATA pulse Abdomen:  Soft Neurologic: A&O  CBC    Component Value Date/Time   WBC 7.2 12/04/2018 0214   RBC 3.12 (L) 12/04/2018 0214   HGB 9.0 (L) 12/04/2018 0214   HCT 27.8 (L) 12/04/2018 0214   PLT 240 12/04/2018 0214   MCV 89.1 12/04/2018 0214   MCH 28.8 12/04/2018 0214   MCHC 32.4 12/04/2018 0214   RDW 13.0 12/04/2018 0214   LYMPHSABS 2.8 11/20/2018 1907   MONOABS 0.5 11/20/2018 1907   EOSABS 0.5 11/20/2018 1907   BASOSABS 0.1 11/20/2018 1907    BMET    Component Value Date/Time   NA 134 (L) 11/23/2018 0316   K 4.6 11/23/2018 0316   CL 100 11/23/2018 0316   CO2 28 11/23/2018 0316   GLUCOSE 108 (H) 11/23/2018 0316   BUN 27 (H) 11/23/2018 0316   CREATININE 1.03 (H) 11/23/2018 0316   CALCIUM 8.0 (L) 11/23/2018 0316   GFRNONAA 53 (L) 11/23/2018 0316   GFRAA >60 11/23/2018 0316    INR    Component Value Date/Time   INR 2.6 (H) 12/04/2018 0214     Intake/Output Summary (Last 24 hours) at 12/04/2018 5830 Last data filed at 12/03/2018 2200 Gross per 24 hour  Intake 720 ml  Output -  Net 720 ml     Assessment/Plan:  78 y.o. female is s/p tibial thrombectomy re-admitted for fasciotomy incision bleeding 14 Days Post-Op   Continue hydrogel dressing changes daily Unwilling to discharge to [facility] now PT to re-evaluate for potential discharge home Case manager consulted to assess home needs Continue coumadin per pharmacy Dispo pending the above   Emilie Rutter, PA-C [facility] [phone] 12/04/2018 8:37 AM  Agree with the above.  Dressings dry.  INR theraputic.   Patient wants to explore going home.  WIll have PT and SW re-evaluate dispo plans  Wells Brabham

## 2018-12-04 NOTE — Progress Notes (Signed)
CSW has been made aware of placement decision. CSW called [facility] to make them aware, they are not able to take the patient this late in the day. They will be able to accept her first thing in the morning.   CSW will continue to follow and assist with discharge planning disposition.   Drucilla Schmidt, MSW, LCSW-A Clinical Social Worker [facility] [employer]

## 2018-12-05 ENCOUNTER — Telehealth: Payer: Self-pay | Admitting: Vascular Surgery

## 2018-12-05 LAB — GLUCOSE, CAPILLARY: Glucose-Capillary: 88 mg/dL (ref 70–99)

## 2018-12-05 LAB — PROTIME-INR
INR: 2.4 — ABNORMAL HIGH (ref 0.8–1.2)
Prothrombin Time: 26.1 seconds — ABNORMAL HIGH (ref 11.4–15.2)

## 2018-12-05 MED ORDER — OXYCODONE HCL 5 MG PO TABS: 5.0000 mg | ORAL_TABLET | Freq: Four times a day (QID) | ORAL | 0 refills | Status: DC | PRN

## 2018-12-05 NOTE — TOC Transition Note (Signed)
Transition of Care [facility]) - CM/SW Discharge Note   Patient Details  Name: Gabriella [name] MRN: [id number] Date of Birth: 03/08/1941  [facility] CM/SW Contact:  Margarito Liner, LCSW Phone Number: 12/05/2018, 10:04 AM   Clinical Narrative: CSW acknowledges consult regarding concerns with not being able to make decisions and daughter spending $99 of her money. Spoke with patient this morning and she is fine with going to SNF today. Her daughter had a flat tire and used her money to pay for assistance with that. She stated she would not have done this if it was not an emergency and if she had asked her, patient would have said she could use the money. Patient is no longer upset about this. [facility] will transport patient. They will not pay for a PTAR per [facility] social worker, Marylu Lund.  CSW facilitated patient discharge including contacting patient family and facility to confirm patient discharge plans. Clinical information faxed to facility and family agreeable with plan. CSW arranged ambulance transport via [facility] to [facility]. RN to call report prior to discharge [phone]).  CSW will sign off for now as social work intervention is no longer needed. Please consult [place] again if new needs arise.  Final next level of care: Skilled Nursing Facility Barriers to Discharge: Barriers Resolved   Patient Goals and CMS Choice     Choice offered to / list presented to : Patient  Discharge Placement   Existing PASRR number confirmed : 11/22/18          Patient chooses bed at: [facility] Patient to be transferred to facility by: [facility] will transport. Name of family member notified: Marlinda Mike Patient and family notified of of transfer: 12/05/18  Discharge Plan and Services Discharge Planning Services: CM Consult                      Social Determinants of Health (SDOH) Interventions     Readmission Risk Interventions Readmission Risk Prevention Plan 12/01/2018   Transportation Screening Complete  Medication Review (RN Care Manager) Patient Refused  PCP or Specialist appointment within 3-5 days of discharge Complete  HRI or Home Care Consult Not Complete  HRI or Home Care Consult Pt Refusal Comments Patient will go to SNF.  SW Recovery Care/Counseling Consult Patient refused  Palliative Care Screening Not Applicable  Skilled Nursing Facility Complete

## 2018-12-05 NOTE — Telephone Encounter (Signed)
sch appt spk to pt daughter mld ltr 01/04/2019 1245pm wound check MD

## 2018-12-05 NOTE — Discharge Summary (Signed)
Physician Discharge Summary   Patient ID: Gabriella [name] [id number] 77 y.o. 15-Oct-1940  Admit date: 11/20/2018  Discharge date and time: 12/05/18   Admitting Physician: Sherren Kerns, MD   Discharge Physician: Dr. Myra Gianotti  Admission Diagnoses: Bleeding from wound [T14.8XXA] Anemia, unspecified type [D64.9] Bleeding [R58]  Discharge Diagnoses: same  Admission Condition: fair  Discharged Condition: fair  Indication for Admission: bleeding from LLE fasciotomy incision  Hospital Course: Gabriella [name] is a 78 year old female who underwent left leg tibial embolectomy and fasciotomy who presented to the emergency department 2 weeks later with bleeding from lateral fasciotomy incision on 11/20/2018.  She was admitted to the hospital and taken to the operating room on the same day by Dr. fields for exploration of fasciotomy wound with control of hemorrhage.  She tolerated the procedure well.  Much of her hospital stay consisted of discharge planning as well as bridging back to a therapeutic INR.  [facility] is [facility] in [facility] and we consulted the clinical social work team to assist with skilled nursing facility placement.  Patient had a bad experience with [facility] and thus requested a new skilled nursing facility.  Social work arranged [facility].  Wound care throughout hospital stay consisted of hydrogel dressing changes.  At the time of discharge her lateral fasciotomy wound has a healthy wound bed and minor dry eschar on the skin edges.  She has also maintained a palpable left anterior tibial artery pulse throughout her hospital stay.  This morning she is ready for discharge to [facility].  She will be prescribed an additional 1 to 2 days of narcotic pain medication for continued postoperative pain control.  We will continue her daily hydrogel dressing changes which include a Kerlix and Ace wrap.  She will follow-up  with Dr. Darrick Penna in office for wound check in about 4 weeks.  Discharge instructions were reviewed with the patient and she voices her understanding.  She will be discharged to [facility] skilled nursing facility today in stable condition.  Consults: None  Treatments: surgery: Exploration of the left lateral fasciotomy incision with control of bleeding by Dr. Darrick Penna on 11/20/2018  Discharge Exam: Patient in agreement she will be discharged to [facility].  She denies any rest pain left foot. Vitals:   12/05/18 0300 12/05/18 0731  BP:  (!) 120/55  Pulse:    Resp: 15 12  Temp:  97.9 F (36.6 C)  SpO2:  96%   Lungs: Nonlabored Incisions: Left lateral fasciotomy incision with a healthy wound bed and small eschar on the medial border of the skin edge Extremities: Palpable left anterior tibial artery pulse Abdomen:  soft Neurologic: A&O   Disposition: Discharge disposition: 03-Skilled Nursing Facility       Patient Instructions:  Allergies as of 12/05/2018      Reactions   Other Diarrhea, Nausea And Vomiting, Anxiety, Other (See Comments)   Unknown pain medication: caused sweating, nervousness, and "Some new pain med given at [facility], but I don't know what it's called." Patient states "it knocked me out and I got sweaty and they gave me benadryl."      Medication List    TAKE these medications   acetaminophen 650 MG CR tablet Commonly known as:  TYLENOL Take 650 mg by mouth See admin instructions. Take 650 mg by mouth two times a day and an additional 650 mg at midday as needed for  breakthrough pain   Advair Diskus 250-50 MCG/DOSE Aepb Generic drug:  Fluticasone-Salmeterol Inhale 1 puff into the lungs 2 (two) times daily.   antiseptic oral rinse Liqd 10-15 mLs by Mouth Rinse route 6 (six) times daily.   ARTIFICIAL TEARS PF OP Place 1 drop into both eyes 2 (two) times daily.   Basaglar KwikPen 100 UNIT/ML Sopn Inject 22 Units into the  skin at bedtime.   BIOFREEZE EX Apply 1 application topically See admin instructions. Apply to affected areas 2 times a day for pain   chlorhexidine 0.12 % solution Commonly known as:  PERIDEX 5 mLs See admin instructions. Brush 1 teaspoonful (5 mls) of solution onto teeth and gums after evening mouth care/SPIT OUT EXCESS   diphenhydramine-acetaminophen 25-500 MG Tabs tablet Commonly known as:  TYLENOL PM Take 2 tablets by mouth at bedtime.   DULoxetine 60 MG capsule Commonly known as:  CYMBALTA Take 60 mg by mouth daily.   enoxaparin 100 MG/ML injection Commonly known as:  LOVENOX Inject 0.9 mLs (90 mg total) into the skin every 12 (twelve) hours.   feeding supplement (PRO-STAT SUGAR FREE 64) Liqd Take 30 mLs by mouth 3 (three) times daily.   gabapentin 600 MG tablet Commonly known as:  NEURONTIN Take 600 mg by mouth 2 (two) times daily.   insulin aspart 100 UNIT/ML FlexPen Commonly known as:  NovoLOG FlexPen Inject 10 Units into the skin once as needed for up to 1 day (for a BGL of 504). What changed:    when to take this  additional instructions   ipratropium-albuterol 0.5-2.5 (3) MG/3ML Soln Commonly known as:  DUONEB Take 3 mLs by nebulization every 4 (four) hours as needed. What changed:  reasons to take this   lisinopril 30 MG tablet Commonly known as:  PRINIVIL,ZESTRIL Take 30 mg by mouth See admin instructions. Take 30 mg by mouth once a day and hold if Systolic reading is <100   magnesium hydroxide 400 MG/5ML suspension Commonly known as:  MILK OF MAGNESIA Take 30 mLs by mouth daily as needed for mild constipation.   Melatonin 10 MG Caps Take 10 mg by mouth at bedtime.   metFORMIN 1000 MG tablet Commonly known as:  GLUCOPHAGE Take 1,000 mg by mouth 2 (two) times daily with a meal.   Micro Guard 2 % powder Generic drug:  miconazole Apply 1 application topically See admin instructions. Apply to abdominal folds and perineum 2 times a day   Oxycodone  HCl 10 MG Tabs Take 1 tablet (10 mg total) by mouth every 4 (four) hours as needed for severe pain. What changed:    when to take this  reasons to take this   oxyCODONE 5 MG immediate release tablet Commonly known as:  Oxy IR/ROXICODONE Take 1 tablet (5 mg total) by mouth every 6 (six) hours as needed for moderate pain. What changed:  You were already taking a medication with the same name, and this prescription was added. Make sure you understand how and when to take each.   polyethylene glycol powder powder Commonly known as:  GLYCOLAX/MIRALAX Take 17 g by mouth daily.   Proventil HFA 108 (90 Base) MCG/ACT inhaler Generic drug:  albuterol Inhale 2 puffs into the lungs every 6 (six) hours as needed for shortness of breath (or coughing).   ramelteon 8 MG tablet Commonly known as:  ROZEREM Take 8 mg by mouth at bedtime.   rOPINIRole 2 MG 24 hr tablet Commonly known as:  REQUIP XL Take  2 mg by mouth at bedtime.   rosuvastatin 20 MG tablet Commonly known as:  CRESTOR Take 20 mg by mouth at bedtime.   sennosides-docusate sodium 8.6-50 MG tablet Commonly known as:  SENOKOT-S Take 2 tablets by mouth at bedtime.   traZODone 50 MG tablet Commonly known as:  DESYREL Take 50 mg by mouth at bedtime.   Turmeric 500 MG Caps Take 500 mg by mouth 2 (two) times daily.   Vitamin D3 1.25 MG (50000 UT) Caps Take 50,000 Units by mouth every Sunday.   warfarin 7.5 MG tablet Commonly known as:  COUMADIN Take 1 tablet (7.5 mg total) by mouth daily.      Activity: activity as tolerated Diet: regular diet Wound Care: Continue daily hydrogel dressing changes with care Kerlix and Ace wrap  Follow-up with Dr. Darrick Penna in 4 weeks.  SignedEmilie Rutter 12/05/2018 8:23 AM

## 2018-12-05 NOTE — Progress Notes (Signed)
Explained and discussed Discharge instructions to pt, discharge instructions and prescription put in packet given to [facility] driver to give to nurse at [facility]

## 2018-12-05 NOTE — Plan of Care (Signed)
?  Problem: Clinical Measurements: ?Goal: Ability to maintain clinical measurements within normal limits will improve ?Outcome: Progressing ?Goal: Will remain free from infection ?Outcome: Progressing ?Goal: Diagnostic test results will improve ?Outcome: Progressing ?Goal: Respiratory complications will improve ?Outcome: Progressing ?Goal: Cardiovascular complication will be avoided ?Outcome: Progressing ?  ?Problem: Activity: ?Goal: Risk for activity intolerance will decrease ?Outcome: Progressing ?  ?Problem: Elimination: ?Goal: Will not experience complications related to bowel motility ?Outcome: Progressing ?Goal: Will not experience complications related to urinary retention ?Outcome: Progressing ?  ?Problem: Pain Managment: ?Goal: General experience of comfort will improve ?Outcome: Progressing ?  ?Problem: Safety: ?Goal: Ability to remain free from injury will improve ?Outcome: Progressing ?  ?Problem: Skin Integrity: ?Goal: Risk for impaired skin integrity will decrease ?Outcome: Progressing ?  ?

## 2018-12-05 NOTE — Plan of Care (Signed)
  Problem: Health Behavior/Discharge Planning: Goal: Ability to manage health-related needs will improve Outcome: Adequate for Discharge   Problem: Clinical Measurements: Goal: Ability to maintain clinical measurements within normal limits will improve Outcome: Adequate for Discharge Goal: Will remain free from infection Outcome: Adequate for Discharge Goal: Diagnostic test results will improve Outcome: Adequate for Discharge Goal: Respiratory complications will improve Outcome: Adequate for Discharge Goal: Cardiovascular complication will be avoided Outcome: Adequate for Discharge   Problem: Activity: Goal: Risk for activity intolerance will decrease Outcome: Adequate for Discharge   Problem: Coping: Goal: Level of anxiety will decrease Outcome: Adequate for Discharge   Problem: Elimination: Goal: Will not experience complications related to bowel motility Outcome: Adequate for Discharge Goal: Will not experience complications related to urinary retention Outcome: Adequate for Discharge   Problem: Pain Managment: Goal: General experience of comfort will improve Outcome: Adequate for Discharge   Problem: Safety: Goal: Ability to remain free from injury will improve Outcome: Adequate for Discharge   Problem: Skin Integrity: Goal: Risk for impaired skin integrity will decrease Outcome: Adequate for Discharge  Going to [facility] for rehab

## 2018-12-05 NOTE — Telephone Encounter (Signed)
-----   Message from Emilie Rutter, PA-C sent at 12/05/2018  8:18 AM EDT ----- Can you schedule an appt for this pt with Dr. Darrick Penna in 4 weeks for wound check.  PO tibial embolectomy and fasciotomy. Thanks, [employer]

## 2019-01-04 ENCOUNTER — Ambulatory Visit (INDEPENDENT_AMBULATORY_CARE_PROVIDER_SITE_OTHER): Payer: Self-pay | Admitting: Vascular Surgery

## 2019-01-04 ENCOUNTER — Other Ambulatory Visit: Payer: Self-pay

## 2019-01-04 ENCOUNTER — Encounter: Payer: Self-pay | Admitting: Vascular Surgery

## 2019-01-04 VITALS — BP 130/76 | HR 100 | Temp 97.9°F | Resp 20 | Ht 63.0 in | Wt 187.9 lb

## 2019-01-04 DIAGNOSIS — I739 Peripheral vascular disease, unspecified: Secondary | ICD-10-CM

## 2019-01-04 MED ORDER — OXYCODONE HCL 5 MG PO TABS: 5.0000 mg | ORAL_TABLET | Freq: Every day | ORAL | 0 refills | Status: DC

## 2019-01-04 NOTE — Progress Notes (Signed)
Patient is a 78 year old female who underwent a left leg embolectomy and fasciotomy approximately 2 months ago.  She states she still has some pain in the lateral fasciotomy incision.  All of her other wounds are healed.  She does not have any pain in her left foot.  She denies claudication symptoms.  She states that she has returned home and is living independently.  She was requesting further narcotic pain medication today.  She states that apparently her PCP gave her a recent prescription for oxycodone 10 mg tablets.  She only has 2 of these remaining.  Physical exam:  Vitals:   01/04/19 1009  BP: 130/76  Pulse: 100  Resp: 20  Temp: 97.9 F (36.6 C)  SpO2: 97%  Weight: 187 lb 14.4 oz (85.2 kg)  Height: 5\' 3"  (1.6 m)    Left lower extremity: Easily palpable anterior tibial artery pulse at the level of ankle.  Foot is pink warm well-perfused.  Lateral fasciotomy wound is essentially healed at this point.  It is 2 cm in width 4 cm in length with heaped up granulation tissue.  Silver nitrate was applied to this today so that the skin can completely epithelialized.  Assessment: Doing well status post embolectomy left leg.  The patient needs to continue her warfarin for life.  She most likely has paroxysmal atrial fibrillation or cardiac issue that caused her to embolized to her left leg.  I emphasized this to the patient today.  Pain control.  I discussed with the patient today that we should be weaning her off of narcotic pain medication at this point since she is almost 2 months out from her operation and her wounds are almost healed at this point.  I did give her a prescription for oxycodone 5 mg tablets #7 dispensed.  I told her she should not take more than 1 of these daily for the next week and she should work on weaning herself off of these.  She will follow-up with me on as-needed basis.  Plan: See above  Fabienne Bruns, MD [facility] [place] Office:  [phone] Pager: [phone]

## 2020-03-08 IMAGING — DX DG CHEST 1V PORT
1 series · 1 of 1 positions shown · non-contrast
Comparison: Radiographs December 02, 2017.

CLINICAL DATA: Gastrointestinal bleeding.

EXAM:
PORTABLE CHEST 1 VIEW

[chest ap]
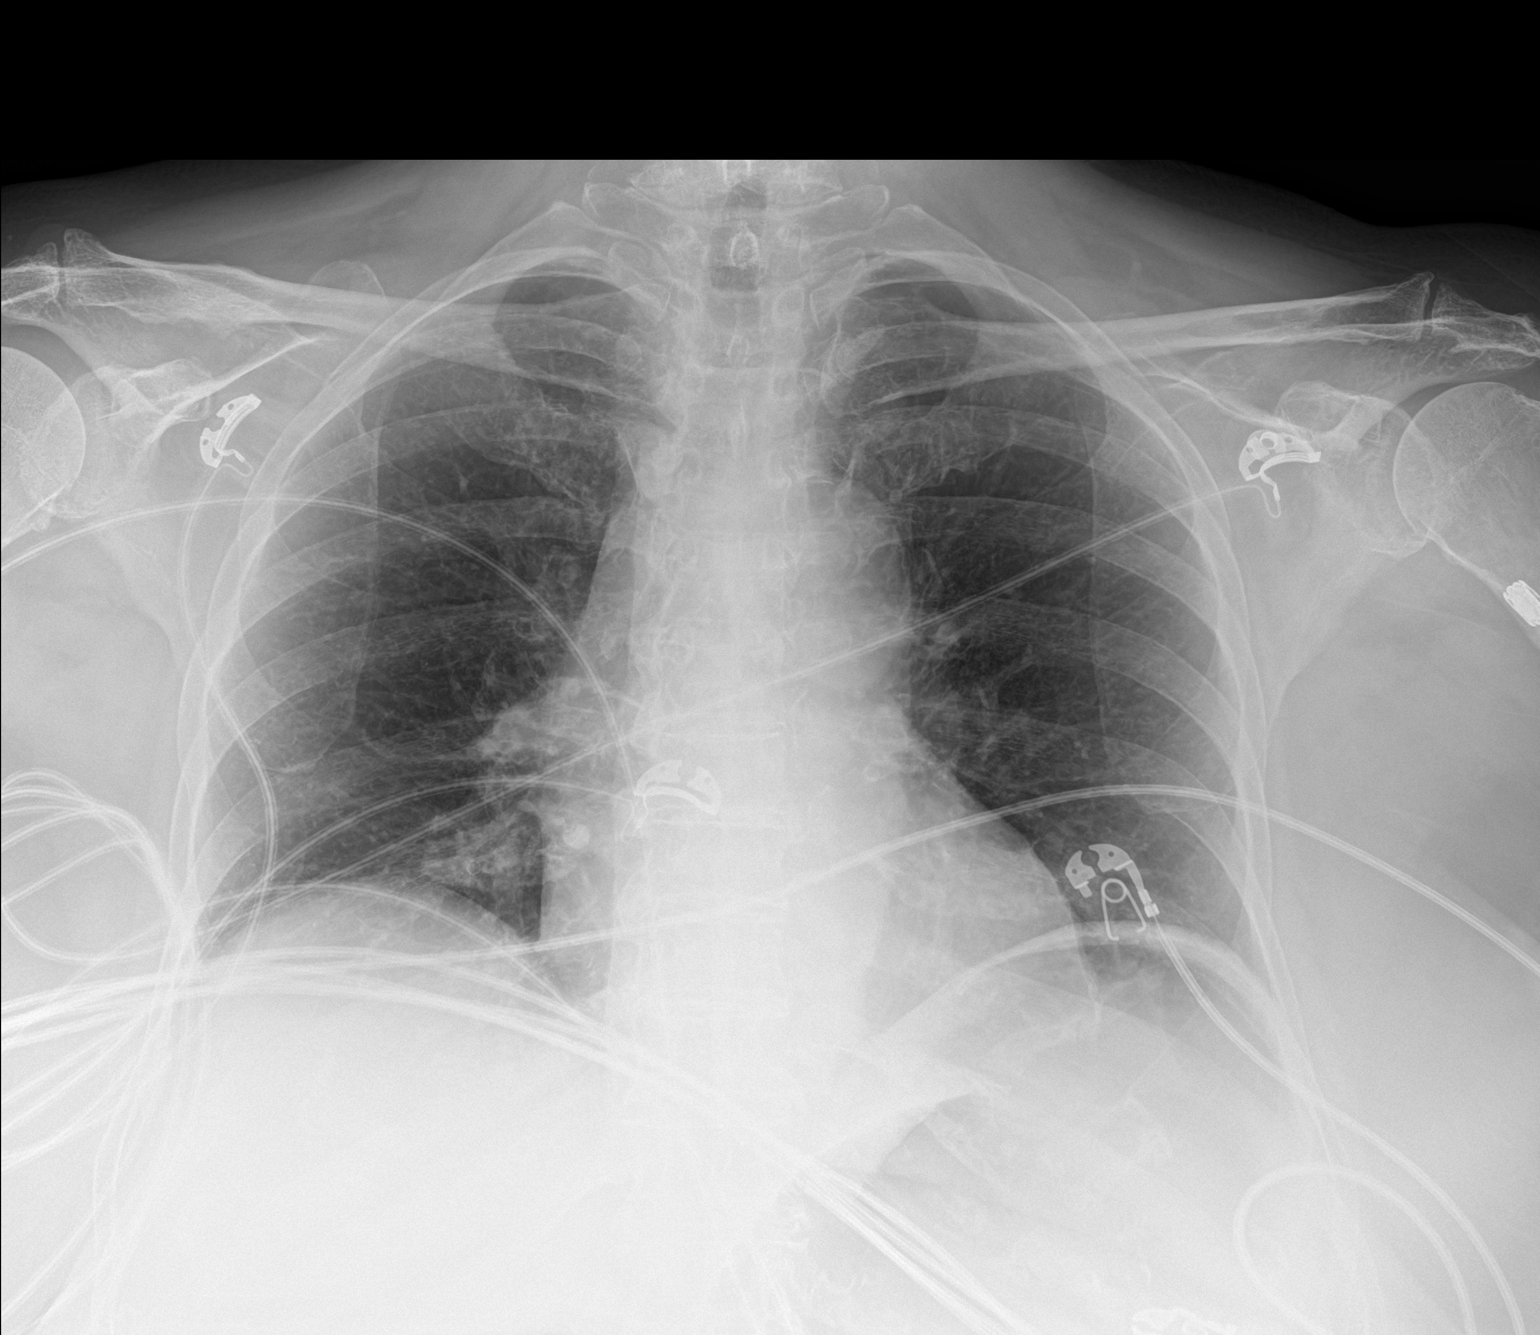

[1 of 1 positions shown; findings below may reference images not displayed]

FINDINGS: The heart size and mediastinal contours are within normal limits.
Both lungs are clear. The visualized skeletal structures are
unremarkable.
IMPRESSION: No active disease.

## 2020-03-11 IMAGING — CT CT ANGIO AOBIFEM WO/W CM
1 of 12 series · 2 of 16 positions shown, 3 images · IV contrast (APPLIED)
Comparison: CT abdomen pelvis - 11/02/2018

CLINICAL DATA: Diffuse abdominal pain and diarrhea. Generalized
weakness. Left lower extremity pain. Evaluate for mesenteric
ischemia as well as PAD.

EXAM:
CT ANGIOGRAPHY OF ABDOMINAL AORTA WITH ILIOFEMORAL RUNOFF
TECHNIQUE: Multidetector CT imaging of the abdomen, pelvis and lower
extremities was performed using the standard protocol during bolus
administration of intravenous contrast. Multiplanar CT image
reconstructions and MIPs were obtained to evaluate the vascular
anatomy.
CONTRAST:  100mL [id number] IOPAMIDOL ([id number]) INJECTION 76%

[Series 5: arterial · axial · arterial · 0.94mm/px · z∈[+496,+938]mm · 2 of 664 slices shown, 3 images]
[im 222/664  soft-tissue]
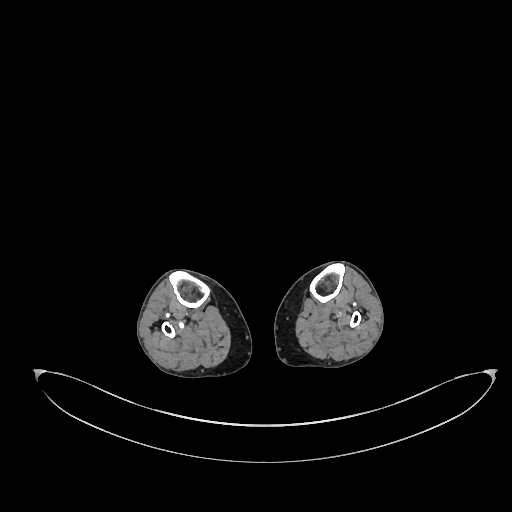
[im 222/664  bone]
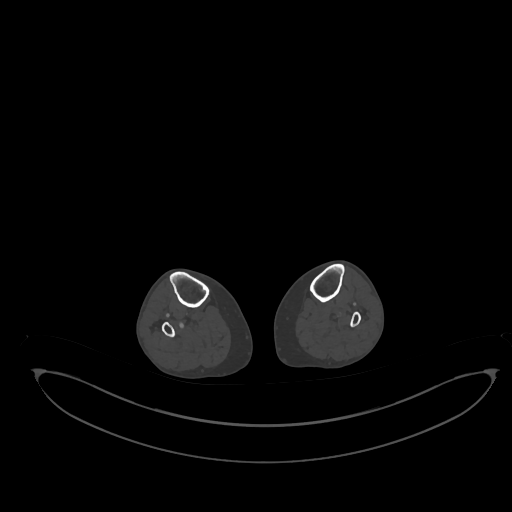
[im 443/664  soft-tissue]
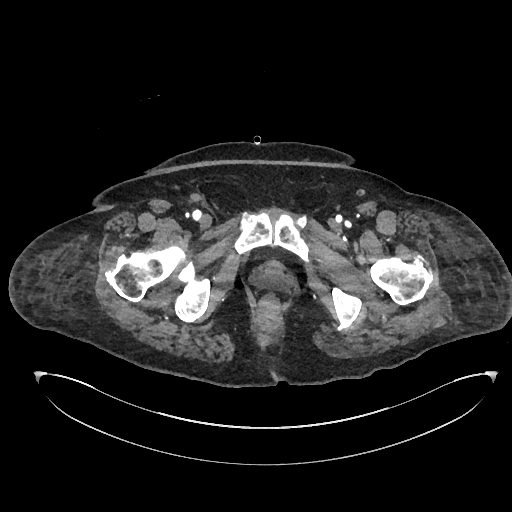

[2 of 16 positions shown; findings below may reference images not displayed]

FINDINGS: VASCULAR

Aorta: There is a minimal amount of eccentric probably calcified
atherosclerotic plaque scattered throughout the normal caliber
abdominal aorta, not resulting in hemodynamically significant
stenosis. No abdominal aortic dissection or periaortic stranding.

Celiac: There is a very minimal amount of eccentric noncalcified
atherosclerotic plaque involving the origin the celiac artery, not
resulting in hemodynamically significant stenosis. Note is made of a
separate origin of the left gastric artery arising directly from
abdominal aorta.

SMA: Widely patent without hemodynamically significant narrowing.
Conventional branching pattern. The distal tributaries the SMA
appear widely patent without discrete intraluminal filling defect to
suggest distal embolism.

Renals: Solitary bilaterally. There is a minimal amount of eccentric
calcified plaque involving the origin the bilateral renal arteries,
not resulting in hemodynamically significant stenosis. No vessel
irregularity to suggest FMD.

IMA: Widely patent without hemodynamically significant narrowing.

RIGHT Lower Extremity

Inflow: The right common and external iliac arteries are mildly
tortuous but of normal caliber and widely patent without
hemodynamically significant stenosis. There is a minimal amount of
eccentric calcified plaque within normal caliber right internal
iliac artery.

Outflow: The right common and deep femoral arteries are of normal
caliber and widely patent without hemodynamically significant
stenosis. There is a minimal amount of slightly irregular
atherosclerotic plaque involving the mid and distal aspects of the
right superficial femoral artery, not resulting in hemodynamically
significant stenosis.

The right above and below-knee popliteal arteries are widely patent
without hemodynamically significant stenosis.

Runoff: 3 vessel runoff to the right lower leg and foot. Right-sided
dorsalis pedis artery is patent to the level of the forefoot. No
discrete intraluminal filling defect suggest distal embolism.

LEFT Lower Extremity

Inflow: The left common, external and internal iliac arteries are
tortuous but of normal caliber and widely patent without
hemodynamically significant stenosis.

Outflow: The left common and deep femoral arteries are widely patent
without hemodynamically significant stenosis. There is abrupt
occlusion of the distal aspect of left superficial femoral artery at
the level of the abductor canal abductor canal (image 349, series 5
extending to involve the left above knee popliteal artery (image
367, series 5). There is reconstitution of the left above knee
popliteal artery via deep thigh collaterals. The left below-knee
popliteal artery is widely patent.

Runoff: There is abrupt occlusion of the tibioperoneal trunk (image
435, series 5) with early reconstitution of the left posterior
tibial artery via infra geniculate collaterals (image 42, series 5).
There is atretic reconstitution of the more distal aspect the left
peroneal artery (image 473, series 5) which remains patent to the
level of the lower leg. While the left anterior tibial artery is
patent, there is abrupt occlusion of the origin of the left dorsalis
pedis artery (image 578, series 5).

Veins: The IVC and pelvic venous system appear widely patent on this
arterial phase examination.

Review of the MIP images confirms the above findings.

_________________________________________________________

NON-VASCULAR

Evaluation the abdominal organs is limited to the arterial phase of
enhancement.

Lower chest: Limited evaluation of the lower thorax demonstrates
trace bilateral effusions with associated bibasilar subsegmental
atelectasis. There is a punctate (approximately 0.6 cm) calcified
granuloma within the imaged right lower lobe (image 7, series 5). No
discrete focal airspace opacities.

Normal heart size.

Hepatobiliary: Mild nodularity hepatic contour. No discrete
hyperenhancing hepatic lesions. Layering radiopaque gallstones
within otherwise normal-appearing gallbladder. No bladder wall
thickening or pericholecystic fluid. No ascites.

Pancreas: Unchanged punctate (approximately 0.5 cm) calcification
within the head of the pancreas which appears remote from both the
common bile and pancreatic ducts. No definitive downstream
pancreatic ductal dilatation or atrophy. No definitive
peripancreatic stranding.

Spleen: Normal appearance of the spleen.

Adrenals/Urinary Tract: There is symmetric enhancement of the
bilateral kidneys. No definite renal stones on this postcontrast
examination. No discrete renal lesions. No urinary obstruction or
perinephric stranding.

There is mild thickening of the bilateral adrenal glands without
discrete nodule. Normal appearance of the urinary bladder given
degree distention.

Stomach/Bowel: Previously questioned diffuse small bowel wall
thickening has improved in the interval. Scattered colonic
diverticulosis without evidence of superimposed acute
diverticulitis. The cecum is noted to be located within the right
mid hemiabdomen. Normal appearance of the terminal ileum. The
appendix is not visualized, however there is no pericecal
inflammatory change. No pneumoperitoneum, pneumatosis or portal
venous gas.

Lymphatic: Scattered mesenteric lymph nodes are numerous though
individually not enlarged by size criteria. No bulky
retroperitoneal, mesenteric, pelvic or inguinal lymphadenopathy.

Reproductive: Normal appearance of the pelvic organs for age. No
discrete adnexal lesion. No free fluid the pelvic cul-de-sac.

Other: Moderate-sized bilateral mesenteric fat containing indirect
inguinal hernias the right measuring approximately 9.8 x 3.4 cm the
left measuring approximately 9.0 x 2.4 cm (both inguinal hernias
demonstrated on coronal image 45, series 7).

Diffuse body wall anasarca. Gluteal calcifications noted about the
right buttocks.

Musculoskeletal: No acute or aggressive osseous abnormalities.
Stigmata of DISH within the lower thoracic and lumbar spine. Mild
multilevel lumbar spine DDD, worse at L4-L5 with disc space height
loss, endplate irregularity and sclerosis. Mild degenerative change
the bilateral hips.
IMPRESSION: VASCULAR

1. Minimal amount of atherosclerotic plaque within normal caliber
abdominal aorta, not resulting in a hemodynamically significant
stenosis. Aortic Atherosclerosis ([id number]).

Left lower extremity vascular Impression:

1. Findings concerning for distal embolism with short-segment
occlusion of the distal aspect of the left SFA/proximal popliteal
artery, short-segment occlusion of the tibioperoneal trunk and
suspected distal embolism involving the origin of the left dorsalis
pedis artery. As there is no significant upstream vascular disease
within the abdomen or pelvis or inflow vascular of the left lower
extremity, potential etiologies could include thoracic aortic
aneurysm/dissection or a cardiogenic source of emboli. Further
evaluation with cardiac echo and or dedicated chest CTA could be
performed as indicated.
2. The remainder of the left lower extremity arterial system appears
widely patent without a hemodynamically significant stenosis.

Right lower extremity vascular impression:

1. No evidence of a hemodynamically significant stenosis affecting
the right lower extremity arterial system.
2. Widely patent three-vessel runoff to the right lower leg and
foot. No discrete intraluminal filling defects to suggest distal
embolism affecting the right lower extremity.

________________________________________________________

NON-VASCULAR

1. Interval improvement of previously question diffuse small bowel
wall thickening, currently without CT explanation for patient's
generalized abdominal pain. Specifically, no CT evidence of
mesenteric ischemia. No pneumatosis or portal venous gas.
2. Cholelithiasis without evidence of cholecystitis.
3. Colonic diverticulosis without evidence of superimposed
diverticulitis.
4. Trace bilateral effusions with diffuse body wall anasarca,
nonspecific though could be seen in the setting early pulmonary
edema/congestive heart failure.
5. Moderate-sized bilateral mesenteric fat containing indirect
inguinal hernias.

Critical Value/emergent results were called by telephone at the time
of interpretation on 11/05/2018 at [DATE] to Dr. Subramanian, who
verbally acknowledged these results.

## 2020-04-04 ENCOUNTER — Ambulatory Visit
Admission: RE | Admit: 2020-04-04 | Discharge: 2020-04-04 | Disposition: A | Discharge status: Home / Self Care | Payer: Medicare (Managed Care) | Source: Ambulatory Visit | Attending: Nurse Practitioner | Admitting: Nurse Practitioner

## 2020-04-04 ENCOUNTER — Other Ambulatory Visit: Payer: Self-pay | Admitting: Nurse Practitioner

## 2020-04-04 DIAGNOSIS — M25511 Pain in right shoulder: Secondary | ICD-10-CM

## 2020-04-08 ENCOUNTER — Ambulatory Visit: Payer: Medicare (Managed Care) | Admitting: Physician Assistant

## 2020-04-09 ENCOUNTER — Emergency Department (HOSPITAL_COMMUNITY): Payer: Medicare (Managed Care)

## 2020-04-09 ENCOUNTER — Inpatient Hospital Stay (HOSPITAL_COMMUNITY)
Admission: EM | Admit: 2020-04-09 | Discharge: 2020-04-12 | DRG: 091 | Disposition: A | Discharge status: Home / Self Care | Payer: Medicare (Managed Care) | Attending: Internal Medicine | Admitting: Internal Medicine

## 2020-04-09 ENCOUNTER — Other Ambulatory Visit: Payer: Self-pay

## 2020-04-09 DIAGNOSIS — Z20822 Contact with and (suspected) exposure to covid-19: Secondary | ICD-10-CM | POA: Diagnosis present

## 2020-04-09 DIAGNOSIS — Z7901 Long term (current) use of anticoagulants: Secondary | ICD-10-CM | POA: Diagnosis not present

## 2020-04-09 DIAGNOSIS — E876 Hypokalemia: Secondary | ICD-10-CM | POA: Diagnosis present

## 2020-04-09 DIAGNOSIS — J449 Chronic obstructive pulmonary disease, unspecified: Secondary | ICD-10-CM | POA: Diagnosis present

## 2020-04-09 DIAGNOSIS — Z7951 Long term (current) use of inhaled steroids: Secondary | ICD-10-CM

## 2020-04-09 DIAGNOSIS — G92 Toxic encephalopathy: Principal | ICD-10-CM | POA: Diagnosis present

## 2020-04-09 DIAGNOSIS — Z87898 Personal history of other specified conditions: Secondary | ICD-10-CM

## 2020-04-09 DIAGNOSIS — E1151 Type 2 diabetes mellitus with diabetic peripheral angiopathy without gangrene: Secondary | ICD-10-CM | POA: Diagnosis present

## 2020-04-09 DIAGNOSIS — Z79891 Long term (current) use of opiate analgesic: Secondary | ICD-10-CM

## 2020-04-09 DIAGNOSIS — R404 Transient alteration of awareness: Secondary | ICD-10-CM | POA: Diagnosis present

## 2020-04-09 DIAGNOSIS — Z79899 Other long term (current) drug therapy: Secondary | ICD-10-CM | POA: Diagnosis not present

## 2020-04-09 DIAGNOSIS — I10 Essential (primary) hypertension: Secondary | ICD-10-CM | POA: Diagnosis present

## 2020-04-09 DIAGNOSIS — J9601 Acute respiratory failure with hypoxia: Secondary | ICD-10-CM | POA: Diagnosis present

## 2020-04-09 DIAGNOSIS — N179 Acute kidney failure, unspecified: Secondary | ICD-10-CM | POA: Diagnosis present

## 2020-04-09 DIAGNOSIS — F0390 Unspecified dementia without behavioral disturbance: Secondary | ICD-10-CM | POA: Diagnosis present

## 2020-04-09 DIAGNOSIS — G9340 Encephalopathy, unspecified: Secondary | ICD-10-CM | POA: Diagnosis present

## 2020-04-09 DIAGNOSIS — Z8673 Personal history of transient ischemic attack (TIA), and cerebral infarction without residual deficits: Secondary | ICD-10-CM

## 2020-04-09 DIAGNOSIS — R296 Repeated falls: Secondary | ICD-10-CM | POA: Diagnosis present

## 2020-04-09 DIAGNOSIS — S42034D Nondisplaced fracture of lateral end of right clavicle, subsequent encounter for fracture with routine healing: Secondary | ICD-10-CM | POA: Diagnosis not present

## 2020-04-09 DIAGNOSIS — E785 Hyperlipidemia, unspecified: Secondary | ICD-10-CM | POA: Diagnosis present

## 2020-04-09 DIAGNOSIS — Z86718 Personal history of other venous thrombosis and embolism: Secondary | ICD-10-CM

## 2020-04-09 DIAGNOSIS — W1830XD Fall on same level, unspecified, subsequent encounter: Secondary | ICD-10-CM

## 2020-04-09 DIAGNOSIS — Z6833 Body mass index (BMI) 33.0-33.9, adult: Secondary | ICD-10-CM

## 2020-04-09 DIAGNOSIS — D649 Anemia, unspecified: Secondary | ICD-10-CM | POA: Diagnosis present

## 2020-04-09 DIAGNOSIS — E1142 Type 2 diabetes mellitus with diabetic polyneuropathy: Secondary | ICD-10-CM | POA: Diagnosis present

## 2020-04-09 DIAGNOSIS — G2581 Restless legs syndrome: Secondary | ICD-10-CM | POA: Diagnosis present

## 2020-04-09 DIAGNOSIS — Z66 Do not resuscitate: Secondary | ICD-10-CM | POA: Diagnosis present

## 2020-04-09 DIAGNOSIS — Z794 Long term (current) use of insulin: Secondary | ICD-10-CM | POA: Diagnosis not present

## 2020-04-09 DIAGNOSIS — Z7722 Contact with and (suspected) exposure to environmental tobacco smoke (acute) (chronic): Secondary | ICD-10-CM | POA: Diagnosis present

## 2020-04-09 DIAGNOSIS — E860 Dehydration: Secondary | ICD-10-CM | POA: Diagnosis present

## 2020-04-09 DIAGNOSIS — T50995A Adverse effect of other drugs, medicaments and biological substances, initial encounter: Secondary | ICD-10-CM | POA: Diagnosis present

## 2020-04-09 LAB — I-STAT ARTERIAL BLOOD GAS, ED
Acid-Base Excess: 8 mmol/L — ABNORMAL HIGH (ref 0.0–2.0)
Bicarbonate: 33.4 mmol/L — ABNORMAL HIGH (ref 20.0–28.0)
Calcium, Ion: 1.14 mmol/L — ABNORMAL LOW (ref 1.15–1.40)
HCT: 32 % — ABNORMAL LOW (ref 36.0–46.0)
Hemoglobin: 10.9 g/dL — ABNORMAL LOW (ref 12.0–15.0)
O2 Saturation: 91 %
Patient temperature: 99.3
Potassium: 3.9 mmol/L (ref 3.5–5.1)
Sodium: 139 mmol/L (ref 135–145)
TCO2: 35 mmol/L — ABNORMAL HIGH (ref 22–32)
pCO2 arterial: 50 mmHg — ABNORMAL HIGH (ref 32.0–48.0)
pH, Arterial: 7.434 (ref 7.350–7.450)
pO2, Arterial: 62 mmHg — ABNORMAL LOW (ref 83.0–108.0)

## 2020-04-09 LAB — CBC WITH DIFFERENTIAL/PLATELET
Abs Immature Granulocytes: 0.06 10*3/uL (ref 0.00–0.07)
Basophils Absolute: 0.1 10*3/uL (ref 0.0–0.1)
Basophils Relative: 1 %
Eosinophils Absolute: 0 10*3/uL (ref 0.0–0.5)
Eosinophils Relative: 0 %
HCT: 39.1 % (ref 36.0–46.0)
Hemoglobin: 13.3 g/dL (ref 12.0–15.0)
Immature Granulocytes: 1 %
Lymphocytes Relative: 10 %
Lymphs Abs: 1.3 10*3/uL (ref 0.7–4.0)
MCH: 29.9 pg (ref 26.0–34.0)
MCHC: 34 g/dL (ref 30.0–36.0)
MCV: 87.9 fL (ref 80.0–100.0)
Monocytes Absolute: 0.6 10*3/uL (ref 0.1–1.0)
Monocytes Relative: 5 %
Neutro Abs: 11.2 10*3/uL — ABNORMAL HIGH (ref 1.7–7.7)
Neutrophils Relative %: 83 %
Platelets: 207 10*3/uL (ref 150–400)
RBC: 4.45 MIL/uL (ref 3.87–5.11)
RDW: 12.2 % (ref 11.5–15.5)
WBC: 13.3 10*3/uL — ABNORMAL HIGH (ref 4.0–10.5)
nRBC: 0 % (ref 0.0–0.2)

## 2020-04-09 LAB — URINALYSIS, ROUTINE W REFLEX MICROSCOPIC
Bacteria, UA: NONE SEEN
Bilirubin Urine: NEGATIVE
Glucose, UA: >=500 mg/dL — AB
Ketones, ur: 5 mg/dL — AB
Leukocytes,Ua: NEGATIVE
Nitrite: NEGATIVE
Protein, ur: >=300 mg/dL — AB
Specific Gravity, Urine: 1.025 (ref 1.005–1.030)
pH: 6 (ref 5.0–8.0)

## 2020-04-09 LAB — COMPREHENSIVE METABOLIC PANEL
ALT: 13 U/L (ref 0–44)
AST: 23 U/L (ref 15–41)
Albumin: 3.1 g/dL — ABNORMAL LOW (ref 3.5–5.0)
Alkaline Phosphatase: 49 U/L (ref 38–126)
Anion gap: 10 (ref 5–15)
BUN: 25 mg/dL — ABNORMAL HIGH (ref 8–23)
CO2: 33 mmol/L — ABNORMAL HIGH (ref 22–32)
Calcium: 9 mg/dL (ref 8.9–10.3)
Chloride: 94 mmol/L — ABNORMAL LOW (ref 98–111)
Creatinine, Ser: 1.24 mg/dL — ABNORMAL HIGH (ref 0.44–1.00)
GFR calc Af Amer: 49 mL/min — ABNORMAL LOW (ref >60)
GFR calc non Af Amer: 42 mL/min — ABNORMAL LOW (ref >60)
Glucose, Bld: 249 mg/dL — ABNORMAL HIGH (ref 70–99)
Potassium: 4.1 mmol/L (ref 3.5–5.1)
Sodium: 137 mmol/L (ref 135–145)
Total Bilirubin: 1.1 mg/dL (ref 0.3–1.2)
Total Protein: 7 g/dL (ref 6.5–8.1)

## 2020-04-09 LAB — SARS CORONAVIRUS 2 BY RT PCR (HOSPITAL ORDER, PERFORMED IN ~~LOC~~ HOSPITAL LAB): SARS Coronavirus 2: NEGATIVE

## 2020-04-09 LAB — CBG MONITORING, ED
Glucose-Capillary: 244 mg/dL — ABNORMAL HIGH (ref 70–99)
Glucose-Capillary: 158 mg/dL — ABNORMAL HIGH (ref 70–99)

## 2020-04-09 LAB — PROTIME-INR
INR: 1.2 (ref 0.8–1.2)
Prothrombin Time: 14.7 seconds (ref 11.4–15.2)

## 2020-04-09 LAB — HEMOGLOBIN A1C
Hgb A1c MFr Bld: 10.3 % — ABNORMAL HIGH (ref 4.8–5.6)
Mean Plasma Glucose: 248.91 mg/dL

## 2020-04-09 LAB — TROPONIN I (HIGH SENSITIVITY)
Troponin I (High Sensitivity): 14 ng/L (ref <18)
Troponin I (High Sensitivity): 15 ng/L (ref <18)

## 2020-04-09 MED ORDER — ROSUVASTATIN CALCIUM 20 MG PO TABS
20.0000 mg | ORAL_TABLET | Freq: Every day | ORAL | Status: DC
  Administered 2020-04-09 – 2020-04-11 (×3): 20 mg via ORAL
  Filled 2020-04-09: qty 1
  Filled 2020-04-09: qty 4
  Filled 2020-04-09: qty 1

## 2020-04-09 MED ORDER — SODIUM CHLORIDE 0.9 % IV BOLUS
1000.0000 mL | Freq: Once | INTRAVENOUS | Status: AC
  Administered 2020-04-09: 1000 mL via INTRAVENOUS

## 2020-04-09 MED ORDER — HYDRALAZINE HCL 20 MG/ML IJ SOLN
10.0000 mg | Freq: Four times a day (QID) | INTRAMUSCULAR | Status: DC | PRN
  Administered 2020-04-11 – 2020-04-12 (×2): 10 mg via INTRAVENOUS
  Filled 2020-04-09 (×2): qty 1

## 2020-04-09 MED ORDER — ACETAMINOPHEN 650 MG RE SUPP: 650.0000 mg | Freq: Four times a day (QID) | RECTAL | Status: DC | PRN

## 2020-04-09 MED ORDER — ENOXAPARIN SODIUM 40 MG/0.4ML ~~LOC~~ SOLN
40.0000 mg | SUBCUTANEOUS | Status: DC
  Administered 2020-04-09: 40 mg via SUBCUTANEOUS
  Filled 2020-04-09: qty 0.4

## 2020-04-09 MED ORDER — SORBITOL 70 % SOLN
30.0000 mL | Freq: Every day | Status: DC | PRN
  Filled 2020-04-09: qty 30

## 2020-04-09 MED ORDER — INSULIN DETEMIR 100 UNIT/ML ~~LOC~~ SOLN
15.0000 [IU] | Freq: Every day | SUBCUTANEOUS | Status: DC
  Administered 2020-04-09 – 2020-04-10 (×2): 15 [IU] via SUBCUTANEOUS
  Filled 2020-04-09 (×2): qty 0.15

## 2020-04-09 MED ORDER — APIXABAN 5 MG PO TABS
5.0000 mg | ORAL_TABLET | Freq: Two times a day (BID) | ORAL | Status: DC
  Administered 2020-04-09 – 2020-04-12 (×6): 5 mg via ORAL
  Filled 2020-04-09 (×8): qty 1

## 2020-04-09 MED ORDER — INSULIN ASPART 100 UNIT/ML ~~LOC~~ SOLN
0.0000 [IU] | Freq: Three times a day (TID) | SUBCUTANEOUS | Status: DC
  Administered 2020-04-10: 1 [IU] via SUBCUTANEOUS
  Administered 2020-04-10: 2 [IU] via SUBCUTANEOUS

## 2020-04-09 MED ORDER — METOPROLOL SUCCINATE ER 25 MG PO TB24
25.0000 mg | ORAL_TABLET | Freq: Every day | ORAL | Status: DC
  Administered 2020-04-09 – 2020-04-12 (×4): 25 mg via ORAL
  Filled 2020-04-09 (×4): qty 1

## 2020-04-09 MED ORDER — INSULIN ASPART 100 UNIT/ML ~~LOC~~ SOLN
0.0000 [IU] | Freq: Every day | SUBCUTANEOUS | Status: DC
  Administered 2020-04-10: 3 [IU] via SUBCUTANEOUS

## 2020-04-09 MED ORDER — ONDANSETRON HCL 4 MG/2ML IJ SOLN: 4.0000 mg | Freq: Four times a day (QID) | INTRAMUSCULAR | Status: DC | PRN

## 2020-04-09 MED ORDER — SENNA 8.6 MG PO TABS
1.0000 | ORAL_TABLET | Freq: Two times a day (BID) | ORAL | Status: DC
  Administered 2020-04-09 – 2020-04-12 (×5): 8.6 mg via ORAL
  Filled 2020-04-09 (×6): qty 1

## 2020-04-09 MED ORDER — ONDANSETRON HCL 4 MG PO TABS: 4.0000 mg | ORAL_TABLET | Freq: Four times a day (QID) | ORAL | Status: DC | PRN

## 2020-04-09 MED ORDER — SODIUM CHLORIDE 0.9 % IV SOLN: INTRAVENOUS | Status: DC

## 2020-04-09 MED ORDER — OXYCODONE HCL 5 MG PO TABS: 5.0000 mg | ORAL_TABLET | ORAL | Status: DC | PRN

## 2020-04-09 MED ORDER — FLUTICASONE FUROATE-VILANTEROL 200-25 MCG/INH IN AEPB
1.0000 | INHALATION_SPRAY | Freq: Every day | RESPIRATORY_TRACT | Status: DC
  Administered 2020-04-11 – 2020-04-12 (×2): 1 via RESPIRATORY_TRACT
  Filled 2020-04-09: qty 28

## 2020-04-09 MED ORDER — SODIUM CHLORIDE 0.9% FLUSH
3.0000 mL | Freq: Two times a day (BID) | INTRAVENOUS | Status: DC
  Administered 2020-04-10 – 2020-04-12 (×4): 3 mL via INTRAVENOUS

## 2020-04-09 MED ORDER — MAGNESIUM HYDROXIDE 400 MG/5ML PO SUSP
30.0000 mL | Freq: Every day | ORAL | Status: DC | PRN
  Filled 2020-04-09: qty 30

## 2020-04-09 MED ORDER — ACETAMINOPHEN 325 MG PO TABS: 650.0000 mg | ORAL_TABLET | Freq: Four times a day (QID) | ORAL | Status: DC | PRN

## 2020-04-09 NOTE — ED Triage Notes (Signed)
Pt here via EMS from doctor's office ([facility]) for AMS x 6 days. Seen here for fall last week, and since then has had multiple falls (three times today) and increased confusion. Normally ambulatory with walker and conversational but today can hardly bear weight and cannot name basic objects such as "pen" and is not answering orientation questions appropriately. No other obvious injuries other than R shoulder bruising from old injury from fall after which she was evaluated here.

## 2020-04-09 NOTE — ED Notes (Signed)
[phone] Estevan Ryder guardian/ caregiver  Please call for an update .

## 2020-04-09 NOTE — ED Provider Notes (Signed)
[facility] EMERGENCY DEPARTMENT Provider Note   CSN: [id number] Arrival date & time: 04/09/20  1246     History Chief Complaint  Patient presents with  . Altered Mental Status    Gabriella [name] is a 79 y.o. female.  The history is provided by the EMS personnel and medical records. No language interpreter was used.  Altered Mental Status    79 year old female with significant history of diabetes, COPD, hypertension sent here from doctor's office for further evaluation of altered mental status.  Per EMS, patient is a [facility] patient who has exhibited altered mental status for the past 6 days.  She was previously seen a week ago for fall in which he suffered a right clavicular fracture.  Since then she has had multiple times including 3 times a day with increased confusion.  Normally she is ambulatory with a walker and is conversational but today she cannot bear weight and is more forgetful.  History is limited, level 5 caveat applies.  Past Medical History:  Diagnosis Date  . Arthritis   . COPD (chronic obstructive pulmonary disease) (HCC)   . Diabetes mellitus without complication (HCC)   . Hypertension     Patient Active Problem List   Diagnosis Date Noted  . Bleeding 11/20/2018  . 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 Peripheral vascular disease (HCC) 11/05/2018  . AKI (acute kidney injury) (HCC)   . Diarrhea   . Hyperglycemia   . Gastroenteritis 11/02/2018  . Pressure injury of skin 12/04/2017  . Diabetic ketoacidosis (HCC) 12/03/2017  . Acute respiratory failure with hypoxia (HCC) 12/02/2017  . Influenza B 12/02/2017    Past Surgical History:  Procedure Laterality Date  . CATARACT EXTRACTION Right 11/2017  . EMBOLECTOMY Left 11/05/2018   Procedure: EMBOLECTOMY LEFT POPLITEAL ARTERY;  Surgeon: Sherren Kerns, MD;  Location: [facility] OR;  Service: Vascular;  Laterality: Left;  . FASCIOTOMY Left 11/05/2018   Procedure:  Four compartment Fasciotomy left lower leg;  Surgeon: Sherren Kerns, MD;  Location: [facility] OR;  Service: Vascular;  Laterality: Left;  . I & D EXTREMITY Left 11/20/2018   Procedure: IRRIGATION AND DEBRIDEMENT LEFT LOWER EXTREMITY;  Surgeon: Sherren Kerns, MD;  Location: [facility] OR;  Service: Vascular;  Laterality: Left;  . PATCH ANGIOPLASTY Left 11/05/2018   Procedure: Patch Angioplasty LEFT POPLITEAL ARTERY;  Surgeon: Sherren Kerns, MD;  Location: [facility] OR;  Service: Vascular;  Laterality: Left;     OB History   No obstetric history on file.     No family history on file.  Social History   Tobacco Use  . Smoking status: Passive Smoke Exposure - Never Smoker  . Smokeless tobacco: Never Used  Vaping Use  . Vaping Use: Never used  Substance Use Topics  . Alcohol use: No  . Drug use: No    Home Medications Prior to Admission medications   Medication Sig Start Date End Date Taking? Authorizing Provider  acetaminophen (TYLENOL) 650 MG CR tablet Take 650 mg by mouth See admin instructions. Take 650 mg by mouth two times a day and an additional 650 mg at midday as needed for breakthrough pain    [provider]  albuterol (PROVENTIL HFA) 108 (90 Base) MCG/ACT inhaler Inhale 2 puffs into the lungs every 6 (six) hours as needed for shortness of breath (or coughing).  12/02/16   [provider]  Amino Acids-Protein Hydrolys (FEEDING SUPPLEMENT, PRO-STAT SUGAR FREE 64,)  LIQD Take 30 mLs by mouth 3 (three) times daily.    [provider]  antiseptic oral rinse (BIOTENE) LIQD 10-15 mLs by Mouth Rinse route 6 (six) times daily.    [provider]  chlorhexidine (PERIDEX) 0.12 % solution 5 mLs See admin instructions. Brush 1 teaspoonful (5 mls) of solution onto teeth and gums after evening mouth care/SPIT OUT EXCESS    [provider]  Cholecalciferol (VITAMIN D3) 1.25 MG (50000 UT) CAPS Take 50,000 Units by mouth every Sunday.    [provider]   Dextran 70-Hypromellose (ARTIFICIAL TEARS PF OP) Place 1 drop into both eyes 2 (two) times daily.    [provider]  diphenhydramine-acetaminophen (TYLENOL PM) 25-500 MG TABS tablet Take 2 tablets by mouth at bedtime.     [provider]  DULoxetine (CYMBALTA) 60 MG capsule Take 60 mg by mouth daily. 12/02/16   [provider]  enoxaparin (LOVENOX) 100 MG/ML injection Inject 0.9 mLs (90 mg total) into the skin every 12 (twelve) hours. 11/14/18   Claudean SeveranceKrienke, Marissa M, MD  Fluticasone-Salmeterol (ADVAIR DISKUS) 250-50 MCG/DOSE AEPB Inhale 1 puff into the lungs 2 (two) times daily.    [provider]  gabapentin (NEURONTIN) 600 MG tablet Take 600 mg by mouth 2 (two) times daily.  12/02/16   [provider]  insulin aspart (NOVOLOG FLEXPEN) 100 UNIT/ML FlexPen Inject 10 Units into the skin once as needed for up to 1 day (for a BGL of 504). Patient taking differently: Inject 10 Units into the skin See admin instructions. Inject 2-10 units into the skin 3 times a day before meals, per sliding scale: BGL 151-200 = 2 units; 201-250 = 4 units; 251-300 = 6 units; 301-350 = 8 units, 351-400 = 10 units; >400, call MD 11/14/18 11/19/18  Claudean SeveranceKrienke, Marissa M, MD  Insulin Glargine (BASAGLAR KWIKPEN) 100 UNIT/ML SOPN Inject 22 Units into the skin at bedtime.     [provider]  ipratropium-albuterol (DUONEB) 0.5-2.5 (3) MG/3ML SOLN Take 3 mLs by nebulization every 4 (four) hours as needed. Patient taking differently: Take 3 mLs by nebulization every 4 (four) hours as needed (for shortness of breath).  12/06/17 11/19/18  Chundi, Sherlyn LeesVahini, MD  lisinopril (PRINIVIL,ZESTRIL) 30 MG tablet Take 30 mg by mouth See admin instructions. Take 30 mg by mouth once a day and hold if Systolic reading is <100    [provider]  magnesium hydroxide (MILK OF MAGNESIA) 400 MG/5ML suspension Take 30 mLs by mouth daily as needed for mild constipation.    [provider]   Melatonin 10 MG CAPS Take 10 mg by mouth at bedtime.    [provider]  Menthol, Topical Analgesic, (BIOFREEZE EX) Apply 1 application topically See admin instructions. Apply to affected areas 2 times a day for pain    [provider]  metFORMIN (GLUCOPHAGE) 1000 MG tablet Take 1,000 mg by mouth 2 (two) times daily with a meal.    [provider]  miconazole (MICRO GUARD) 2 % powder Apply 1 application topically See admin instructions. Apply to abdominal folds and perineum 2 times a day    [provider]  oxyCODONE (OXY IR/ROXICODONE) 5 MG immediate release tablet Take 1 tablet (5 mg total) by mouth every 6 (six) hours as needed for moderate pain. Patient not taking: Reported on 01/04/2019 12/05/18   Emilie RutterEveland, Matthew, PA-C  oxyCODONE (OXY IR/ROXICODONE) 5 MG immediate release tablet Take 1 tablet (5 mg total) by mouth daily. Take  once daily for severe pain 01/04/19   Sherren Kerns, MD  oxyCODONE 10 MG TABS Take 1 tablet (10 mg total) by mouth every 4 (four) hours as needed for severe pain. Patient not taking: Reported on 01/04/2019 11/14/18   Claudean Severance, MD  polyethylene glycol powder (GLYCOLAX/MIRALAX) powder Take 17 g by mouth daily.    [provider]  ramelteon (ROZEREM) 8 MG tablet Take 8 mg by mouth at bedtime.    [provider]  rOPINIRole (REQUIP XL) 2 MG 24 hr tablet Take 2 mg by mouth at bedtime.    [provider]  rosuvastatin (CRESTOR) 20 MG tablet Take 20 mg by mouth at bedtime.    [provider]  sennosides-docusate sodium (SENOKOT-S) 8.6-50 MG tablet Take 2 tablets by mouth at bedtime.    [provider]  traZODone (DESYREL) 50 MG tablet Take 50 mg by mouth at bedtime.  09/01/17   [provider]  Turmeric 500 MG CAPS Take 500 mg by mouth 2 (two) times daily.    [provider]  warfarin (COUMADIN) 7.5 MG tablet Take 1 tablet (7.5 mg total) by mouth daily. 11/14/18   Claudean Severance, MD    Allergies    Other  Review of Systems   Review of Systems  Unable to perform ROS: Mental status change    Physical Exam Updated Vital Signs BP (!) 135/113 (BP Location: Right Arm)   Pulse (!) 115   Temp 99.3 F (37.4 C) (Oral)   Resp 20   SpO2 97%   Physical Exam Vitals and nursing note reviewed.  Constitutional:      General: She is not in acute distress.    Appearance: She is well-developed. She is obese. She is ill-appearing.     Comments: Elderly obese female ill-appearing lying in bed.  HENT:     Head: Normocephalic and atraumatic.     Mouth/Throat:     Mouth: Mucous membranes are dry.     Comments: Lips are dry, tongue is dry Eyes:     Extraocular Movements: Extraocular movements intact.     Conjunctiva/sclera: Conjunctivae normal.     Pupils: Pupils are equal, round, and reactive to light.  Neck:     Comments: No midline cervical spine tenderness crepitus or step-off. Cardiovascular:     Rate and Rhythm: Tachycardia present.     Pulses: Normal pulses.     Heart sounds: Normal heart sounds.  Pulmonary:     Effort: Pulmonary effort is normal.     Breath sounds: Normal breath sounds. No stridor. No wheezing, rhonchi or rales.     Comments: Decreased breath sounds but no obvious wheezes, rales, rhonchi heard. Chest:     Chest wall: Tenderness (Ecchymosis noted to right upper chest wall and right shoulder with tenderness to palpation.) present.  Abdominal:     Palpations: Abdomen is soft.     Tenderness: There is no abdominal tenderness.  Musculoskeletal:     Cervical back: Neck supple.     Comments: Very poor effort but able to move all 4 extremities  Skin:    Findings: No rash.  Neurological:     Mental Status: She is alert. She is disoriented.     Comments: Patient is disoriented, able to say her name but unaware of place, situation, or time      ED Results / Procedures / Treatments   Labs (all labs ordered are listed, but only  abnormal results are  displayed) Labs Reviewed  CBC WITH DIFFERENTIAL/PLATELET - Abnormal; Notable for the following components:      Result Value   WBC 13.3 (*)    Neutro Abs 11.2 (*)    All other components within normal limits  COMPREHENSIVE METABOLIC PANEL - Abnormal; Notable for the following components:   Chloride 94 (*)    CO2 33 (*)    Glucose, Bld 249 (*)    BUN 25 (*)    Creatinine, Ser 1.24 (*)    Albumin 3.1 (*)    GFR calc non Af Amer 42 (*)    GFR calc Af Amer 49 (*)    All other components within normal limits  URINALYSIS, ROUTINE W REFLEX MICROSCOPIC - Abnormal; Notable for the following components:   APPearance HAZY (*)    Glucose, UA >=500 (*)    Hgb urine dipstick SMALL (*)    Ketones, ur 5 (*)    Protein, ur >=300 (*)    All other components within normal limits  CBG MONITORING, ED - Abnormal; Notable for the following components:   Glucose-Capillary 244 (*)    All other components within normal limits  SARS CORONAVIRUS 2 BY RT PCR (HOSPITAL ORDER, PERFORMED IN [facility])  PROTIME-INR  TROPONIN I (HIGH SENSITIVITY)  TROPONIN I (HIGH SENSITIVITY)    EKG EKG Interpretation  Date/Time:  Wednesday April 09 2020 13:53:32 EDT Ventricular Rate:  110 PR Interval:    QRS Duration: 102 QT Interval:  362 QTC Calculation: 490 R Axis:   3 Text Interpretation: Sinus tachycardia Abnormal R-wave progression, early transition Abnormal inferior Q waves Abnormal T, consider ischemia, lateral leads agree. anterior flattening Confirmed by Arby Barrette [phone]) on 04/09/2020 2:21:45 PM   Radiology CT Head Wo Contrast  Result Date: 04/09/2020 CLINICAL DATA:  Altered mental status, facial trauma EXAM: CT HEAD WITHOUT CONTRAST CT CERVICAL SPINE WITHOUT CONTRAST TECHNIQUE: Multidetector CT imaging of the head and cervical spine was performed following the standard protocol without intravenous contrast. Multiplanar CT image reconstructions of the cervical spine  were also generated. COMPARISON:  04/04/2020 FINDINGS: CT HEAD FINDINGS Brain: Normal anatomic configuration. Moderate parenchymal volume loss is again noted, unchanged. Extensive periventricular white matter changes are present likely reflecting the sequela of small vessel ischemia. No abnormal intra or extra-axial mass lesion or fluid collection. No abnormal mass effect or midline shift. No evidence of acute intracranial hemorrhage or infarct. Ventricular size is normal. Cerebellum unremarkable. Vascular: Extensive atherosclerotic calcification noted within the carotid siphons and distal vertebral arteries. No hyperdense vasculature at the skull base. Skull: Intact Sinuses/Orbits: There is mild mucosal thickening within several ethmoid air cells bilaterally as well as the sphenoid sinuses bilaterally in keeping with mild paranasal sinus disease. Remaining paranasal sinuses are clear. Orbits are unremarkable. Other: Mastoid air cells and middle ear cavities are clear. CT CERVICAL SPINE FINDINGS Alignment: Normal cervical lordosis. No listhesis of the cervical spine. Skull base and vertebrae: The craniocervical junction is unremarkable. Atlantal dental interval is normal. Vertebral body height has been preserved. No acute fracture of the cervical spine. Soft tissues and spinal canal: Extensive asymmetric edema is again noted within the right shoulder, incompletely evaluated on this examination, in keeping with given history of recent trauma. The paraspinal soft tissues are unremarkable. No visible canal hematoma. Disc levels: Multilevel mild uncovertebral and facet arthrosis is present with mild resultant bilateral neural foraminal narrowing at C4-5 and. Small posterior disc herniation is noted at C4-5. No significant canal stenosis noted. C5-6  Upper chest: The visualized lung apices are unremarkable. Other: None significant IMPRESSION: No acute intracranial injury.  No calvarial fracture. Mild paranasal sinus  disease. No acute cervical spine fracture or listhesis. Mild multilevel degenerative changes with mild resultant bilateral neural foraminal narrowing at C4-5 and C5-6. Asymmetric soft tissue swelling within the visualized right shoulder, incompletely evaluated, in keeping with given history of recent trauma. Electronically Signed   By: Helyn Numbers MD   On: 04/09/2020 16:01   CT Cervical Spine Wo Contrast  Result Date: 04/09/2020 CLINICAL DATA:  Altered mental status, facial trauma EXAM: CT HEAD WITHOUT CONTRAST CT CERVICAL SPINE WITHOUT CONTRAST TECHNIQUE: Multidetector CT imaging of the head and cervical spine was performed following the standard protocol without intravenous contrast. Multiplanar CT image reconstructions of the cervical spine were also generated. COMPARISON:  04/04/2020 FINDINGS: CT HEAD FINDINGS Brain: Normal anatomic configuration. Moderate parenchymal volume loss is again noted, unchanged. Extensive periventricular white matter changes are present likely reflecting the sequela of small vessel ischemia. No abnormal intra or extra-axial mass lesion or fluid collection. No abnormal mass effect or midline shift. No evidence of acute intracranial hemorrhage or infarct. Ventricular size is normal. Cerebellum unremarkable. Vascular: Extensive atherosclerotic calcification noted within the carotid siphons and distal vertebral arteries. No hyperdense vasculature at the skull base. Skull: Intact Sinuses/Orbits: There is mild mucosal thickening within several ethmoid air cells bilaterally as well as the sphenoid sinuses bilaterally in keeping with mild paranasal sinus disease. Remaining paranasal sinuses are clear. Orbits are unremarkable. Other: Mastoid air cells and middle ear cavities are clear. CT CERVICAL SPINE FINDINGS Alignment: Normal cervical lordosis. No listhesis of the cervical spine. Skull base and vertebrae: The craniocervical junction is unremarkable. Atlantal dental interval is  normal. Vertebral body height has been preserved. No acute fracture of the cervical spine. Soft tissues and spinal canal: Extensive asymmetric edema is again noted within the right shoulder, incompletely evaluated on this examination, in keeping with given history of recent trauma. The paraspinal soft tissues are unremarkable. No visible canal hematoma. Disc levels: Multilevel mild uncovertebral and facet arthrosis is present with mild resultant bilateral neural foraminal narrowing at C4-5 and. Small posterior disc herniation is noted at C4-5. No significant canal stenosis noted. C5-6 Upper chest: The visualized lung apices are unremarkable. Other: None significant IMPRESSION: No acute intracranial injury.  No calvarial fracture. Mild paranasal sinus disease. No acute cervical spine fracture or listhesis. Mild multilevel degenerative changes with mild resultant bilateral neural foraminal narrowing at C4-5 and C5-6. Asymmetric soft tissue swelling within the visualized right shoulder, incompletely evaluated, in keeping with given history of recent trauma. Electronically Signed   By: Helyn Numbers MD   On: 04/09/2020 16:01   DG Chest Portable 1 View  Result Date: 04/09/2020 CLINICAL DATA:  Altered mental status, multiple falls EXAM: PORTABLE CHEST 1 VIEW COMPARISON:  06/15/2019 FINDINGS: The heart size and mediastinal contours are within normal limits. No focal airspace consolidation, pleural effusion, or pneumothorax. Bilateral AC joint arthropathy. IMPRESSION: No active disease. Electronically Signed   By: Duanne Guess D.O.   On: 04/09/2020 13:45    Procedures Procedures (including critical care time)  Medications Ordered in ED Medications  sodium chloride 0.9 % bolus 1,000 mL (0 mLs Intravenous Stopped 04/09/20 1623)    ED Course  I have reviewed the triage vital signs and the nursing notes.  Pertinent labs & imaging results that were available during my care of the patient were reviewed by me  and considered in  my medical decision making (see chart for details).    MDM Rules/Calculators/A&P                          BP 114/63   Pulse (!) 101   Temp 99.3 F (37.4 C) (Oral)   Resp (!) 24   SpO2 93%   Final Clinical Impression(s) / ED Diagnoses Final diagnoses:  Transient alteration of awareness    Rx / DC Orders ED Discharge Orders    None     1:15 PM Patient here for altered mental status.  Has had recurrent falls within the past week and out having difficulty carrying a conversation.  She has a steady decline in her mental health as well as physical health.  Work-up initiated.  Given recent right clavicular injury, I suspect patient may have been taking more for medications such as pain medication that may contribute to her altered mental status.  Will evaluate for any potential infectious source that can cause delirium.  No obvious focal neuro deficit to suggest stroke.  Care discussed with Dr. Rubin Payor.  4:54 PM Screening chest x-ray without acute finding, UA without signs of urinary tract infection.  Head and cervical spine CT scan obtained showing no acute fracture or dislocation.  There is soft tissue swelling visualized in the right shoulder this is consistent with patient previous shoulder/clavicle injury from a fall.  EKG without concerning arrhythmia.  Screening COVID-19 test is negative.  INR is 1.2.  I did reach out and talk to patient's daughter via the phone.  Daughter felt patient is not taking too much pain medication but potentially may have been taking too much sleeping medication.  Her current CBG is 244.  She is dehydrated with a creatinine of 1.24.  IV fluid given.  Elevated white count of 13.3, likely stress demargination.  5:13 PM Appreciate consultation from [facility] Dr. Pola Corn who agrees to see and admit pt for further care.  Suspect either polypharmacy or hypercapnea causing confusion.  Blood gas ordered by hospitalist.  COVID-19 screening  test done. Pt is DNR.  Gabriella [name] was evaluated in Emergency Department on 04/09/2020 for the symptoms described in the history of present illness. She was evaluated in the context of the global COVID-19 pandemic, which necessitated consideration that the patient might be at risk for infection with the SARS-CoV-2 virus that causes COVID-19. Institutional protocols and algorithms that pertain to the evaluation of patients at risk for COVID-19 are in a state of rapid change based on information released by regulatory bodies including the CDC and federal and state organizations. These policies and algorithms were followed during the patient's care in the ED.    Fayrene Helper, PA-C 04/09/20 1714    Arby Barrette, MD 04/20/20 [phone]

## 2020-04-09 NOTE — H&P (Signed)
[facility] History and Physical  Gabriella [name] DOB: [birth year] DOA: 04/09/2020 PCP: Estevan OaksBrown, Beverly Ann, NP  Admitted from: Home Chief Complaint: Altered mental status  History of Present Illness: Gabriella [name] is a 79 y.o. female PMH significant for HTN, DM2, PAD, TIA, COPD.  Patient was brought to the ED today, sent from doctor's office ([facility])  for further evaluation of altered mental status. At the time of my evaluation, patient was sleeping, arousable and able to follow simple motor commands.  However, she is not able to answer questions appropriately to me.  I called her daughter [name] who helped with the history. At baseline, patient mostly prefers to be in bed but can get around with a walker, sometimes uses wheelchair.  She has early signs of dementia and being worked up for that. [facility] 7[facility]16, [facility] she had a mechanical fall injuring her right shoulder.  Imaging in the ED showed nondisplaced fracture of the distal right clavicle. She was discharged home with some pain pills and recommendation to follow up with orthopedics on Monday 7/19. Daughter states patient was given only limited supply of pain medicine and hence she might have been taking her other pills including trazodone more than prescribed for pain control. On Monday, she was very weak and could not go to orthopedics for follow-up.  She has been progressively declining since the fall 5 days ago.  Oral intake and hydration is compromised.  Per her aide, she has also been 'talking out of her head.' Denies any history of fever, nausea, vomiting, diarrhea, chest pain, cough, shortness of breath.  In the ED, patient had a temperature of nine 9.3, heart rate with 100 220, blood pressure mostly elevated up 157/109, oxygen saturation more than 90% on 4 L by nasal cannula. Labs with sodium 137, potassium 4.1, serum bicarb elevated 33, glucose level elevated to 249, BUN/creatinine 25/1.24,  baseline creatinine normal. CBC with WC count elevated 13.3, hemoglobin 13.3, platelet 207. Urinalysis with a yellow hazy urine, more than 5 blood glucose, more than 300 protein. CT head and CT cervical spine did not show any evidence of fracture or intracranial injury. Chest x-ray normal. EKG with sinus tachycardia at 110 bpm, QTC 490 baseline, nonspecific ST-T wave changes. Hospitalist service was consulted for further evaluation management.   Review of Systems:  All systems were reviewed and were negative unless otherwise mentioned in the HPI   Past medical history: Past Medical History:  Diagnosis Date  . Arthritis   . COPD (chronic obstructive pulmonary disease) (HCC)   . Diabetes mellitus without complication (HCC)   . Hypertension     Past surgical history: Past Surgical History:  Procedure Laterality Date  . CATARACT EXTRACTION Right 11/2017  . EMBOLECTOMY Left 11/05/2018   Procedure: EMBOLECTOMY LEFT POPLITEAL ARTERY;  Surgeon: Sherren KernsFields, Charles E, MD;  [facility];  Service: Vascular;  Laterality: Left;  . FASCIOTOMY Left 11/05/2018   Procedure: Four compartment Fasciotomy left lower leg;  Surgeon: Sherren KernsFields, Charles E, MD;  [facility];  Service: Vascular;  Laterality: Left;  . I & D EXTREMITY Left 11/20/2018   Procedure: IRRIGATION AND DEBRIDEMENT LEFT LOWER EXTREMITY;  Surgeon: Sherren KernsFields, Charles E, MD;  [facility];  Service: Vascular;  Laterality: Left;  . PATCH ANGIOPLASTY Left 11/05/2018   Procedure: Patch Angioplasty LEFT POPLITEAL ARTERY;  Surgeon: Sherren KernsFields, Charles E, MD;  [facility];  Service: Vascular;  Laterality: Left;    Social History:  reports that she is a non-smoker but has been exposed to tobacco smoke. She has never used smokeless tobacco. She reports that she does not drink alcohol and does not use drugs.  Allergies:  Allergies  Allergen Reactions  . Other Diarrhea, Nausea And Vomiting, Anxiety and Other (See Comments)    Unknown pain  medication: caused sweating, nervousness, and "Some new pain med given at [facility], but I don't know what it's called." Patient states "it knocked me out and I got sweaty and they gave me benadryl."     Family history:  No family history on file.  History reviewed and is noncontributory.  Home Meds: Prior to Admission medications   Medication Sig Start Date End Date Taking? Authorizing Provider  acetaminophen (TYLENOL) 650 MG CR tablet Take 650 mg by mouth 2 (two) times daily as needed for pain.    Yes [provider]  Cholecalciferol (VITAMIN D3) 1.25 MG (50000 UT) CAPS Take 50,000 Units by mouth every Sunday.   Yes [provider]  diphenhydramine-acetaminophen (TYLENOL PM) 25-500 MG TABS tablet Take 2 tablets by mouth at bedtime.    Yes [provider]  gabapentin (NEURONTIN) 600 MG tablet Take 600 mg by mouth 2 (two) times daily.  12/02/16  Yes [provider]  ipratropium-albuterol (DUONEB) 0.5-2.5 (3) MG/3ML SOLN Take 3 mLs by nebulization every 4 (four) hours as needed. Patient taking differently: Take 3 mLs by nebulization every 4 (four) hours as needed (for shortness of breath).  12/06/17 04/09/20 Yes Chundi, Vahini, MD  lisinopril (PRINIVIL,ZESTRIL) 30 MG tablet Take 30 mg by mouth daily. Hold if Systolic reading is <100   Yes [provider]  warfarin (COUMADIN) 7.5 MG tablet Take 1 tablet (7.5 mg total) by mouth daily. 11/14/18  Yes Claudean Severance, MD  DULoxetine (CYMBALTA) 60 MG capsule Take 60 mg by mouth daily. 12/02/16   [provider]  enoxaparin (LOVENOX) 100 MG/ML injection Inject 0.9 mLs (90 mg total) into the skin every 12 (twelve) hours. Patient not taking: Reported on 04/09/2020 11/14/18   Claudean Severance, MD  insulin aspart (NOVOLOG FLEXPEN) 100 UNIT/ML FlexPen Inject 10 Units into the skin once as needed for up to 1 day (for a BGL of 504). Patient not taking: Reported on 04/09/2020 11/14/18 11/19/18  Claudean Severance, MD  Insulin Glargine [facility]) 100 UNIT/ML SOPN Inject 22 Units into the skin at bedtime.     [provider]  magnesium hydroxide (MILK OF MAGNESIA) 400 MG/5ML suspension Take 30 mLs by mouth daily as needed for mild constipation.    [provider]  Melatonin 10 MG CAPS Take 10 mg by mouth at bedtime.    [provider]  Menthol, Topical Analgesic, (BIOFREEZE EX) Apply 1 application topically See admin instructions. Apply to affected areas 2 times a day for pain    [provider]  metFORMIN (GLUCOPHAGE) 1000 MG tablet Take 1,000 mg by mouth 2 (two) times daily with a meal.    [provider]  miconazole (MICRO GUARD) 2 % powder Apply 1 application topically See admin instructions. Apply to abdominal folds and perineum 2 times a day    [provider]  oxyCODONE (OXY IR/ROXICODONE) 5 MG immediate release tablet Take 1 tablet (5 mg total) by mouth every 6 (six) hours as needed for moderate pain. Patient not taking: Reported on 01/04/2019 12/05/18   Emilie Rutter, PA-C  oxyCODONE (OXY IR/ROXICODONE) 5 MG immediate release tablet Take  1 tablet (5 mg total) by mouth daily. Take once daily for severe pain 01/04/19   Sherren Kerns, MD  oxyCODONE 10 MG TABS Take 1 tablet (10 mg total) by mouth every 4 (four) hours as needed for severe pain. Patient not taking: Reported on 01/04/2019 11/14/18   Claudean Severance, MD  polyethylene glycol powder (GLYCOLAX/MIRALAX) powder Take 17 g by mouth daily.    [provider]  ramelteon (ROZEREM) 8 MG tablet Take 8 mg by mouth at bedtime.    [provider]  rOPINIRole (REQUIP XL) 2 MG 24 hr tablet Take 2 mg by mouth at bedtime.    [provider]  rosuvastatin (CRESTOR) 20 MG tablet Take 20 mg by mouth at bedtime.    [provider]  sennosides-docusate sodium (SENOKOT-S) 8.6-50 MG tablet Take 2 tablets by mouth at bedtime.    [provider]   traZODone (DESYREL) 50 MG tablet Take 50 mg by mouth at bedtime.  09/01/17   [provider]  TRULICITY 0.75 MG/0.5ML SOPN Inject 0.75 mg into the skin once a week. 03/14/20   [provider]  Turmeric 500 MG CAPS Take 500 mg by mouth 2 (two) times daily.    [provider]    Physical Exam: Vitals:   04/09/20 1615 04/09/20 1645 04/09/20 1719 04/09/20 1805  BP: (!) 149/84 114/63 (!) 142/81   Pulse: (!) 103 (!) 101 98 95  Resp: (!) 23 (!) 24 20 20   Temp:      TempSrc:      SpO2: 97% 93% 97% 100%   Wt Readings from Last 3 Encounters:  01/04/19 85.2 kg  11/21/18 93.4 kg  11/06/18 91.9 kg   There is no height or weight on file to calculate BMI.  General exam: Appears calm. Not in physical distress. Skin: No rashes, lesions or ulcers. HEENT: Atraumatic, normocephalic, supple neck, no obvious bleeding Lungs: Clear to auscultate bilaterally CVS: Regular rate and rhythm, no murmur GI/Abd soft, nontender, nondistended, bowel sounds present CNS: Sleeping, opens eyes on verbal command, able to follow simple motor commands, unable to have a conversation Psychiatry: Depressed look Extremities: Mild bilateral pedal edema, no calf tenderness     Consult Orders  (From admission, onward)         Start     Ordered   04/09/20 1653  Consult to hospitalist  ALL PATIENTS BEING ADMITTED/HAVING PROCEDURES NEED COVID-19 SCREENING  Once       Comments: ALL PATIENTS BEING ADMITTED/HAVING PROCEDURES NEED COVID-19 SCREENING  Provider:  (Not yet assigned)  Question Answer Comment  Place call to: [facility]   Reason for Consult Admit      04/09/20 1652          Labs on Admission:   CBC: Recent Labs  Lab 04/09/20 1402 04/09/20 1754  WBC 13.3*  --   NEUTROABS 11.2*  --   HGB 13.3 10.9*  HCT 39.1 32.0*  MCV 87.9  --   PLT 207  --     Basic Metabolic Panel: Recent Labs  Lab 04/09/20 1402 04/09/20 1754  NA 137 139  K 4.1 3.9  CL 94*  --   CO2  33*  --   GLUCOSE 249*  --   BUN 25*  --   CREATININE 1.24*  --   CALCIUM 9.0  --     Liver Function Tests: Recent Labs  Lab 04/09/20 1402  AST 23  ALT 13  ALKPHOS 49  BILITOT 1.1  PROT 7.0  ALBUMIN 3.1*   No results for input(s): LIPASE, AMYLASE in the last 168 hours. No results for input(s): AMMONIA in the last 168 hours.  Cardiac Enzymes: No results for input(s): CKTOTAL, CKMB, CKMBINDEX, TROPONINI in the last 168 hours.  BNP (last 3 results) No results for input(s): BNP in the last 8760 hours.  ProBNP (last 3 results) No results for input(s): PROBNP in the last 8760 hours.  CBG: Recent Labs  Lab 04/09/20 1350  GLUCAP 244*    Lipase  No results found for: LIPASE   Urinalysis    Component Value Date/Time   COLORURINE YELLOW 04/09/2020 1422   APPEARANCEUR HAZY (A) 04/09/2020 1422   LABSPEC 1.025 04/09/2020 1422   PHURINE 6.0 04/09/2020 1422   GLUCOSEU >=500 (A) 04/09/2020 1422   HGBUR SMALL (A) 04/09/2020 1422   BILIRUBINUR NEGATIVE 04/09/2020 1422   KETONESUR 5 (A) 04/09/2020 1422   PROTEINUR >=300 (A) 04/09/2020 1422   NITRITE NEGATIVE 04/09/2020 1422   LEUKOCYTESUR NEGATIVE 04/09/2020 1422     Drugs of Abuse  No results found for: LABOPIA, COCAINSCRNUR, LABBENZ, AMPHETMU, THCU, LABBARB    Radiological Exams on Admission: CT Head Wo Contrast  Result Date: 04/09/2020 CLINICAL DATA:  Altered mental status, facial trauma EXAM: CT HEAD WITHOUT CONTRAST CT CERVICAL SPINE WITHOUT CONTRAST TECHNIQUE: Multidetector CT imaging of the head and cervical spine was performed following the standard protocol without intravenous contrast. Multiplanar CT image reconstructions of the cervical spine were also generated. COMPARISON:  04/04/2020 FINDINGS: CT HEAD FINDINGS Brain: Normal anatomic configuration. Moderate parenchymal volume loss is again noted, unchanged. Extensive periventricular white matter changes are present likely reflecting the sequela of small vessel  ischemia. No abnormal intra or extra-axial mass lesion or fluid collection. No abnormal mass effect or midline shift. No evidence of acute intracranial hemorrhage or infarct. Ventricular size is normal. Cerebellum unremarkable. Vascular: Extensive atherosclerotic calcification noted within the carotid siphons and distal vertebral arteries. No hyperdense vasculature at the skull base. Skull: Intact Sinuses/Orbits: There is mild mucosal thickening within several ethmoid air cells bilaterally as well as the sphenoid sinuses bilaterally in keeping with mild paranasal sinus disease. Remaining paranasal sinuses are clear. Orbits are unremarkable. Other: Mastoid air cells and middle ear cavities are clear. CT CERVICAL SPINE FINDINGS Alignment: Normal cervical lordosis. No listhesis of the cervical spine. Skull base and vertebrae: The craniocervical junction is unremarkable. Atlantal dental interval is normal. Vertebral body height has been preserved. No acute fracture of the cervical spine. Soft tissues and spinal canal: Extensive asymmetric edema is again noted within the right shoulder, incompletely evaluated on this examination, in keeping with given history of recent trauma. The paraspinal soft tissues are unremarkable. No visible canal hematoma. Disc levels: Multilevel mild uncovertebral and facet arthrosis is present with mild resultant bilateral neural foraminal narrowing at C4-5 and. Small posterior disc herniation is noted at C4-5. No significant canal stenosis noted. C5-6 Upper chest: The visualized lung apices are unremarkable. Other: None significant IMPRESSION: No acute intracranial injury.  No calvarial fracture. Mild paranasal sinus disease. No acute cervical spine fracture or listhesis. Mild multilevel degenerative changes with mild resultant bilateral neural foraminal narrowing at C4-5 and C5-6. Asymmetric soft tissue swelling within the visualized right shoulder, incompletely evaluated, in keeping with  given history of recent trauma. Electronically Signed   By: Helyn Numbers MD   On: 04/09/2020 16:01   CT Cervical Spine Wo Contrast  Result Date: 04/09/2020 CLINICAL DATA:  Altered mental  status, facial trauma EXAM: CT HEAD WITHOUT CONTRAST CT CERVICAL SPINE WITHOUT CONTRAST TECHNIQUE: Multidetector CT imaging of the head and cervical spine was performed following the standard protocol without intravenous contrast. Multiplanar CT image reconstructions of the cervical spine were also generated. COMPARISON:  04/04/2020 FINDINGS: CT HEAD FINDINGS Brain: Normal anatomic configuration. Moderate parenchymal volume loss is again noted, unchanged. Extensive periventricular white matter changes are present likely reflecting the sequela of small vessel ischemia. No abnormal intra or extra-axial mass lesion or fluid collection. No abnormal mass effect or midline shift. No evidence of acute intracranial hemorrhage or infarct. Ventricular size is normal. Cerebellum unremarkable. Vascular: Extensive atherosclerotic calcification noted within the carotid siphons and distal vertebral arteries. No hyperdense vasculature at the skull base. Skull: Intact Sinuses/Orbits: There is mild mucosal thickening within several ethmoid air cells bilaterally as well as the sphenoid sinuses bilaterally in keeping with mild paranasal sinus disease. Remaining paranasal sinuses are clear. Orbits are unremarkable. Other: Mastoid air cells and middle ear cavities are clear. CT CERVICAL SPINE FINDINGS Alignment: Normal cervical lordosis. No listhesis of the cervical spine. Skull base and vertebrae: The craniocervical junction is unremarkable. Atlantal dental interval is normal. Vertebral body height has been preserved. No acute fracture of the cervical spine. Soft tissues and spinal canal: Extensive asymmetric edema is again noted within the right shoulder, incompletely evaluated on this examination, in keeping with given history of recent trauma.  The paraspinal soft tissues are unremarkable. No visible canal hematoma. Disc levels: Multilevel mild uncovertebral and facet arthrosis is present with mild resultant bilateral neural foraminal narrowing at C4-5 and. Small posterior disc herniation is noted at C4-5. No significant canal stenosis noted. C5-6 Upper chest: The visualized lung apices are unremarkable. Other: None significant IMPRESSION: No acute intracranial injury.  No calvarial fracture. Mild paranasal sinus disease. No acute cervical spine fracture or listhesis. Mild multilevel degenerative changes with mild resultant bilateral neural foraminal narrowing at C4-5 and C5-6. Asymmetric soft tissue swelling within the visualized right shoulder, incompletely evaluated, in keeping with given history of recent trauma. Electronically Signed   By: Helyn Numbers MD   On: 04/09/2020 16:01   DG Chest Portable 1 View  Result Date: 04/09/2020 CLINICAL DATA:  Altered mental status, multiple falls EXAM: PORTABLE CHEST 1 VIEW COMPARISON:  06/15/2019 FINDINGS: The heart size and mediastinal contours are within normal limits. No focal airspace consolidation, pleural effusion, or pneumothorax. Bilateral AC joint arthropathy. IMPRESSION: No active disease. Electronically Signed   By: Duanne Guess D.O.   On: 04/09/2020 13:45     Lab Results  Component Value Date   HGBA1C 11.5 (H) 11/02/2018   Recent Labs  Lab 04/09/20 1350  GLUCAP 244*   ------------------------------------------------------------------------------------------------------ Assessment/Plan: Active Problems:   Acute encephalopathy  Acute metabolic encephalopathy Progressive generalized weakness -Progressively worsening for last 6 days since fall -Daughter states that patient had limited supply of pain medicine and she might have taken excessive sedatives for pain control. -Also need to rule out hypercapnia in the setting of COPD. -Home meds include Neurontin 600 mg twice  daily, Cymbalta 60 mg daily, melatonin 10 mg daily, ramelteon 8 mg at bedtime, repeat 1 mg at bedtime, trazodone 50 mg at bedtime, Oxycodone 10 mg every 4 as needed -At this time, because of her altered mental state, I would keep all of these medications on hold.  However I would keep oxycodone as needed for pain. -PT evaluation ordered.  AKI -Baseline creatinine normal. -Presented with creatinine elevated to 1.24. -Start  on normal saline at 75 mill per hour.  Continue to monitor creatinine.  Hypertension -On metoprolol and lisinopril at home. -Resume metoprolol.  Keep lisinopril on hold because of AKI. -Continue to monitor blood pressure.  Type 2 diabetes mellitus  -Home meds include Lantus 24 units at bedtime, sliding scale insulin, Trulicity 0.75 mg once a week, Metformin 1000 mg twice daily. -Start on Lantus 15 units at bedtime with sliding scale insulin. Keep Trulicity and Metformin on hold. -Takes Neurontin for neuropathy.  Keep it on hold because of altered mental status.  COPD -Per daughter, patient does not use supplemental oxygen at home. -Earlier in the ED, patient was requiring up to 4 L/min of O2, currently on 1 L. -Continue DuoNeb  Peripheral artery disease -Continue Eliquis 5mg  BID, Crestor 20 mg daily.  Mobility: Limited mobility at baseline. PT eval ordered. Code Status:    Code Status: Full Code  DVT prophylaxis:  Resume Eliquis.  Antimicrobials:  None Fluid: Normal saline at 75 mill per hour  Diet:  Cardiac/diabetic diet.  Consultants: None Family Communication:  Discussed with patient's daughter on the phone  Dispo: The patient is from: Home              Anticipated d/c is to: Home versus SNF based on PT eval              Anticipated d/c date is: 3 days  ------------------------------------------------------------------------------------- Severity of Illness: The appropriate patient status for this patient is INPATIENT. Inpatient status is judged to  be reasonable and necessary in order to provide the required intensity of service to ensure the patient's safety. The patient's presenting symptoms, physical exam findings, and initial radiographic and laboratory data in the context of their chronic comorbidities is felt to place them at high risk for further clinical deterioration. Furthermore, it is not anticipated that the patient will be medically stable for discharge from the hospital within 2 midnights of admission. The following factors support the patient status of inpatient.   " The patient's presenting symptoms include altered mental status, dehydration. " The worrisome physical exam findings include dehydration, altered mental status. " The initial radiographic and laboratory data are worrisome because of AKI, elevated white count. " The chronic co-morbidities include impaired mobility, dementia, diabetes mellitus.   * I certify that at the point of admission it is my clinical judgment that the patient will require inpatient hospital care spanning beyond 2 midnights from the point of admission due to high intensity of service, high risk for further deterioration and high frequency of impairment surveillance required.*  ------------------------------------------------------------------------------------  , MD [facility] Pager: [phone] (Secure Chat preferred). 04/09/2020

## 2020-04-10 DIAGNOSIS — E785 Hyperlipidemia, unspecified: Secondary | ICD-10-CM

## 2020-04-10 DIAGNOSIS — N179 Acute kidney failure, unspecified: Secondary | ICD-10-CM

## 2020-04-10 DIAGNOSIS — I10 Essential (primary) hypertension: Secondary | ICD-10-CM

## 2020-04-10 LAB — CBC WITH DIFFERENTIAL/PLATELET
Abs Immature Granulocytes: 0.02 10*3/uL (ref 0.00–0.07)
Basophils Absolute: 0.1 10*3/uL (ref 0.0–0.1)
Basophils Relative: 1 %
Eosinophils Absolute: 0.2 10*3/uL (ref 0.0–0.5)
Eosinophils Relative: 2 %
HCT: 32.4 % — ABNORMAL LOW (ref 36.0–46.0)
Hemoglobin: 10.8 g/dL — ABNORMAL LOW (ref 12.0–15.0)
Immature Granulocytes: 0 %
Lymphocytes Relative: 32 %
Lymphs Abs: 2.9 10*3/uL (ref 0.7–4.0)
MCH: 29.8 pg (ref 26.0–34.0)
MCHC: 33.3 g/dL (ref 30.0–36.0)
MCV: 89.5 fL (ref 80.0–100.0)
Monocytes Absolute: 0.6 10*3/uL (ref 0.1–1.0)
Monocytes Relative: 7 %
Neutro Abs: 5.3 10*3/uL (ref 1.7–7.7)
Neutrophils Relative %: 58 %
Platelets: 180 10*3/uL (ref 150–400)
RBC: 3.62 MIL/uL — ABNORMAL LOW (ref 3.87–5.11)
RDW: 12.6 % (ref 11.5–15.5)
WBC: 9.2 10*3/uL (ref 4.0–10.5)
nRBC: 0 % (ref 0.0–0.2)

## 2020-04-10 LAB — BASIC METABOLIC PANEL
Anion gap: 7 (ref 5–15)
BUN: 21 mg/dL (ref 8–23)
CO2: 32 mmol/L (ref 22–32)
Calcium: 8.1 mg/dL — ABNORMAL LOW (ref 8.9–10.3)
Chloride: 99 mmol/L (ref 98–111)
Creatinine, Ser: 0.98 mg/dL (ref 0.44–1.00)
GFR calc Af Amer: >60 mL/min (ref >60)
GFR calc non Af Amer: 56 mL/min — ABNORMAL LOW (ref >60)
Glucose, Bld: 116 mg/dL — ABNORMAL HIGH (ref 70–99)
Potassium: 3.3 mmol/L — ABNORMAL LOW (ref 3.5–5.1)
Sodium: 138 mmol/L (ref 135–145)

## 2020-04-10 LAB — CBG MONITORING, ED
Glucose-Capillary: 113 mg/dL — ABNORMAL HIGH (ref 70–99)
Glucose-Capillary: 184 mg/dL — ABNORMAL HIGH (ref 70–99)

## 2020-04-10 LAB — GLUCOSE, CAPILLARY
Glucose-Capillary: 146 mg/dL — ABNORMAL HIGH (ref 70–99)
Glucose-Capillary: 254 mg/dL — ABNORMAL HIGH (ref 70–99)

## 2020-04-10 LAB — PHOSPHORUS: Phosphorus: 2.9 mg/dL (ref 2.5–4.6)

## 2020-04-10 LAB — MAGNESIUM: Magnesium: 1.7 mg/dL (ref 1.7–2.4)

## 2020-04-10 LAB — TSH: TSH: 1.174 u[IU]/mL (ref 0.350–4.500)

## 2020-04-10 MED ORDER — ALBUTEROL SULFATE (2.5 MG/3ML) 0.083% IN NEBU: 2.5000 mg | INHALATION_SOLUTION | RESPIRATORY_TRACT | Status: DC | PRN

## 2020-04-10 MED ORDER — POTASSIUM CHLORIDE CRYS ER 20 MEQ PO TBCR
40.0000 meq | EXTENDED_RELEASE_TABLET | Freq: Once | ORAL | Status: AC
  Administered 2020-04-10: 40 meq via ORAL
  Filled 2020-04-10: qty 2

## 2020-04-10 MED ORDER — ACETAMINOPHEN 500 MG PO TABS
1000.0000 mg | ORAL_TABLET | Freq: Three times a day (TID) | ORAL | Status: DC
  Administered 2020-04-10 – 2020-04-12 (×6): 1000 mg via ORAL
  Filled 2020-04-10 (×6): qty 2

## 2020-04-10 NOTE — Evaluation (Signed)
Physical Therapy Evaluation Patient Details Name: Gabriella [name] MRN: [id number] DOB: [birth year] Today's Date: 04/10/2020   History of Present Illness  Pt is a 79 y/o female admitted secondary to confusion and multiple falls. Pt with recent R clavicular fx. PMH includes HTN, DM, and COPD.   Clinical Impression  Pt admitted secondary to problem above with deficits below. Pt requiring min to mod A for functional mobility tasks this session. Was able to take side steps at EOB with LUE support. Unsure of RUE precautions, as pt with recent R clavicle fx; maintained NWB throughout. Pt reports she lives with her daughter and is active with [facility], however, does have a period of time where she is at home alone. Feel she will require 24/7 assist at d/c. If family/[facility] unable to provide, will likely require SNF level therapies. Will continue to follow acutely to maximize functional mobility independence and safety.     Follow Up Recommendations SNF;Supervision/Assistance - 24 hour (unless family/[facility] able to provide necessary assist)    Equipment Recommendations  Wheelchair (measurements PT);Wheelchair cushion (measurements PT)    Recommendations for Other Services       Precautions / Restrictions Precautions Precautions: Fall Precaution Comments: Pt with multiple falls at home Restrictions Weight Bearing Restrictions: Yes RLE Weight Bearing: Non weight bearing Other Position/Activity Restrictions: Maintained NWB on RUE as pt with clavical fx.       Mobility  Bed Mobility Overal bed mobility: Needs Assistance Bed Mobility: Supine to Sit;Sit to Supine     Supine to sit: Min assist Sit to supine: Supervision   General bed mobility comments: Min A for trunk elevation to come to sitting. Cues not to use RUE  Transfers Overall transfer level: Needs assistance Equipment used: 1 person hand held assist Transfers: Sit to/from Stand Sit to Stand: Mod assist         General transfer comment:  Mod A for lift assist and steadying. PT standing in front of pt and had pt hold to PT arm using LUE.   Ambulation/Gait Ambulation/Gait assistance: Min assist   Assistive device: 1 person hand held assist       General Gait Details: Took side steps at EOB in ED. Min A for steadying assist. Pt using bed to brace BLE.   Stairs            Wheelchair Mobility    Modified Rankin (Stroke Patients Only)       Balance Overall balance assessment: Needs assistance Sitting-balance support: No upper extremity supported;Feet supported Sitting balance-Leahy Scale: Fair     Standing balance support: Single extremity supported;During functional activity Standing balance-Leahy Scale: Poor Standing balance comment: Reliant on LUE and external support                             Pertinent Vitals/Pain Pain Assessment: Faces Faces Pain Scale: Hurts little more Pain Location: R shoulder Pain Descriptors / Indicators: Aching Pain Intervention(s): Limited activity within patient's tolerance;Monitored during session;Repositioned    Home Living Family/patient expects to be discharged to:: Private residence Living Arrangements: Children Available Help at Discharge: Family Type of Home: House Home Access: Level entry     Home Layout: One level Home Equipment: Environmental consultant - 2 wheels;Walker - 4 wheels;Shower seat Additional Comments: Active with [facility]. Pt reports she is alone at some points during the day.     Prior Function Level of Independence: Needs assistance   Gait / Transfers Assistance Needed: Reports  normally using RW for ambulation. Unsure of baseline since clavial fx  ADL's / Homemaking Assistance Needed: Reports daughter has to assist her.         Hand Dominance        Extremity/Trunk Assessment   Upper Extremity Assessment Upper Extremity Assessment: RUE deficits/detail RUE Deficits / Details: Recent R clavical fx         Cervical / Trunk  Assessment Cervical / Trunk Assessment: Kyphotic  Communication   Communication: No difficulties  Cognition Arousal/Alertness: Awake/alert Behavior During Therapy: WFL for tasks assessed/performed Overall Cognitive Status: No family/caregiver present to determine baseline cognitive functioning                                 General Comments: Pt disoriented to situation. Slowed processing noted as well as memory deficits.       General Comments General comments (skin integrity, edema, etc.): No family present     Exercises     Assessment/Plan    PT Assessment Patient needs continued PT services  PT Problem List Decreased strength;Decreased balance;Decreased mobility;Decreased cognition;Decreased knowledge of use of DME;Decreased safety awareness;Decreased knowledge of precautions;Pain       PT Treatment Interventions Gait training;DME instruction;Functional mobility training;Therapeutic activities;Therapeutic exercise;Balance training;Patient/family education    PT Goals (Current goals can be found in the Care Plan section)  Acute Rehab PT Goals Patient Stated Goal: to go home PT Goal Formulation: With patient Time For Goal Achievement: 04/24/20 Potential to Achieve Goals: Good    Frequency Min 3X/week   Barriers to discharge        Co-evaluation               AM-PAC PT "6 Clicks" Mobility  Outcome Measure Help needed turning from your back to your side while in a flat bed without using bedrails?: A Little Help needed moving from lying on your back to sitting on the side of a flat bed without using bedrails?: A Little Help needed moving to and from a bed to a chair (including a wheelchair)?: A Little Help needed standing up from a chair using your arms (e.g., wheelchair or bedside chair)?: A Little Help needed to walk in hospital room?: A Lot Help needed climbing 3-5 steps with a railing? : Total 6 Click Score: 15    End of Session Equipment  Utilized During Treatment: Gait belt Activity Tolerance: Patient tolerated treatment well Patient left: in bed;with call bell/phone within reach (in ED ) Nurse Communication: Mobility status PT Visit Diagnosis: Unsteadiness on feet (R26.81);Muscle weakness (generalized) (M62.81);History of falling (Z91.81);Repeated falls (R29.6)    Time: 0017-4944 PT Time Calculation (min) (ACUTE ONLY): 16 min   Charges:   PT Evaluation $PT Eval Moderate Complexity: 1 Mod          Farley Ly, PT, DPT  Acute Rehabilitation Services  Pager: [phone] Office: [phone]   Lehman Prom 04/10/2020, 5:55 PM

## 2020-04-10 NOTE — Progress Notes (Signed)
Patient arrived to [phone] Alert and oriented x4. Complaining of R shoulder pain. POC provided to patient. Tele notified.

## 2020-04-10 NOTE — Progress Notes (Signed)
Discussed patient case with [facility]. As she is a [facility] patient, [facility] will take over care 04/11/20.   Guinevere Scarlet A, DO 04/10/2020, 7:41 AM Pager: [phone]

## 2020-04-10 NOTE — ED Notes (Signed)
Breakfast Ordered--Artha Chiasson  

## 2020-04-10 NOTE — ED Notes (Signed)
Patient moved to hospital bed. New purewick , sat up on bed for breakfast.

## 2020-04-10 NOTE — Progress Notes (Addendum)
PROGRESS NOTE        PATIENT DETAILS Name: Gabriella [name] Age: 79 y.o. Sex: female Date of Birth: [birth year] Admit Date: 04/09/2020 Admitting Physician Lorin Glass, MD EXH:[id number], Burman Nieves, NP  Brief Narrative: Patient is a 79 y.o. female with history of HTN, DM-2, COPD-who recently fell and sustained a nondisplaced right clavicle-was subsequently placed on narcotics-presented to the hospital on 7/21 with several days history of confusion and frequent falls.. She was subsequently admitted to the hospitalist service.  Significant events: 7/21>> admit at [facility] for confusion-concern for toxic encephalopathy from narcotic use.  Significant studies: 7/21>> CT head: No acute intracranial abnormality 7/21>> CT /C-spine: No acute cervical spine fracture 7/21>> chest x-ray: No active disease. 7/16>> x-ray right shoulder: Nondisplaced fracture of the distal clavicle  Antimicrobial therapy: None  Microbiology data: None  Procedures : None  Consults: None  DVT Prophylaxis :  apixaban (ELIQUIS) tablet 5 mg    Subjective: Sleepy-but seems to be more awake and alert than what was described in the H&P yesterday. Able to answer most of my questions appropriately. Acknowledges some pain in the right shoulder area.  Assessment/Plan: Acute toxic encephalopathy: Suspect related to polypharmacy-recent initiation of narcotics-chronically on Neurontin/Cymbalta/trazodone. Seems to have improved clinically-more alert but still sleepy. CT head/C-spine negative-nonfocal exam. Continue supportive care-await arrival of family for more collaborative data.  Frequent falls: Probably related to polypharmacy-await rehab services evaluation.  Recent right clavicular fracture: Per H&P-for outpatient follow-up with orthopedics. Avoid narcotics as much as possible-we will start scheduled Tylenol.  AKI: Likely hemodynamically related kidney injury-resolved with supportive  care.  Hypokalemia: Replete and recheck  COPD: No signs of exacerbation-continue inhaler regimen.  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 Lisinopril on hold due to mild AKI on admission.  HLD: Continue statin  DM-2: CBG stable-continue Levemir 15 units daily and SSI. Oral hypoglycemic agents on hold-we will resume on discharge.   Recent Labs    04/09/20 1350 04/09/20 2207 04/10/20 0755  GLUCAP 244* 158* 113*    Morbid Obesity: Estimated body mass index is 33.28 kg/m as calculated from the following:   Height as of 01/04/19: 5\' 3"  (1.6 m).   Weight as of 01/04/19: 85.2 kg.     Diet: Diet Order            Diet Carb Modified Fluid consistency: Thin; Room service appropriate? Yes  Diet effective now                  Code Status: Full code   Family Communication: Called daughter-unable to leave a voicemail-mailbox full.  Disposition Plan: Home vs Home with Home health vs SNF when ready for discharge Status is: Inpatient  Remains inpatient appropriate because:Inpatient level of care appropriate due to severity of illness   Dispo: The patient is from: Home              Anticipated d/c is to: TBD              Anticipated d/c date is: 2 days              Patient currently is not medically stable to d/c.   Barriers to Discharge: Resolving encephalopathy-awaiting eval by rehab services to determine safe disposition  Antimicrobial agents: Anti-infectives (From admission, onward)  None       Time spent: 25 minutes-Greater than 50% of this time was spent in counseling, explanation of diagnosis, planning of further management, and coordination of care.  MEDICATIONS: Scheduled Meds: . apixaban  5 mg Oral BID  . fluticasone furoate-vilanterol  1 puff Inhalation Daily  . insulin aspart  0-5 Units Subcutaneous QHS  . insulin aspart  0-9 Units Subcutaneous TID WC  .  insulin detemir  15 Units Subcutaneous QHS  . metoprolol succinate  25 mg Oral Daily  . rosuvastatin  20 mg Oral QHS  . senna  1 tablet Oral BID  . sodium chloride flush  3 mL Intravenous Q12H   Continuous Infusions: . sodium chloride 75 mL/hr at 04/09/20 1819   PRN Meds:.acetaminophen **OR** acetaminophen, hydrALAZINE, magnesium hydroxide, ondansetron **OR** ondansetron (ZOFRAN) IV, oxyCODONE, sorbitol   PHYSICAL EXAM: Vital signs: Vitals:   04/10/20 0530 04/10/20 0630 04/10/20 0700 04/10/20 0756  BP: 133/70 (!) 144/75 (!) 141/84 (!) 151/85  Pulse: 82 83 94 87  Resp: (!) 22 (!) Temp:      TempSrc:      SpO2: 97% 98% 95% 100%   There were no vitals filed for this visit. There is no height or weight on file to calculate BMI.   Gen Exam:Alert awake-not in any distress- however still sleepy. HEENT:atraumatic, normocephalic Chest: B/L clear to auscultation anteriorly CVS:S1S2 regular Abdomen:soft non tender, non distended Extremities:no edema Neurology: Non focal Skin: no rash  I have personally reviewed following labs and imaging studies  LABORATORY DATA: CBC: Recent Labs  Lab 04/09/20 1402 04/09/20 1754 04/10/20 0500  WBC 13.3*  --  9.2  NEUTROABS 11.2*  --  5.3  HGB 13.3 10.9* 10.8*  HCT 39.1 32.0* 32.4*  MCV 87.9  --  89.5  PLT 207  --  180    Basic Metabolic Panel: Recent Labs  Lab 04/09/20 1402 04/09/20 1754 04/10/20 0500  NA 137 139 138  K 4.1 3.9 3.3*  CL 94*  --  99  CO2 33*  --  32  GLUCOSE 249*  --  116*  BUN 25*  --  21  CREATININE 1.24*  --  0.98  CALCIUM 9.0  --  8.1*  MG  --   --  1.7  PHOS  --   --  2.9    GFR: CrCl cannot be calculated (Unknown ideal weight.).  Liver Function Tests: Recent Labs  Lab 04/09/20 1402  AST 23  ALT 13  ALKPHOS 49  BILITOT 1.1  PROT 7.0  ALBUMIN 3.1*   No results for input(s): LIPASE, AMYLASE in the last 168 hours. No results for input(s): AMMONIA in the last 168  hours.  Coagulation Profile: Recent Labs  Lab 04/09/20 1402  INR 1.2    Cardiac Enzymes: No results for input(s): CKTOTAL, CKMB, CKMBINDEX, TROPONINI in the last 168 hours.  BNP (last 3 results) No results for input(s): PROBNP in the last 8760 hours.  Lipid Profile: No results for input(s): CHOL, HDL, LDLCALC, TRIG, CHOLHDL, LDLDIRECT in the last 72 hours.  Thyroid Function Tests: Recent Labs    04/10/20 0500  TSH 1.174    Anemia Panel: No results for input(s): VITAMINB12, FOLATE, FERRITIN, TIBC, IRON, RETICCTPCT in the last 72 hours.  Urine analysis:    Component Value Date/Time   COLORURINE YELLOW 04/09/2020 1422   APPEARANCEUR HAZY (A) 04/09/2020 1422   LABSPEC 1.025 04/09/2020 1422   PHURINE 6.0 04/09/2020 1422  GLUCOSEU >=500 (A) 04/09/2020 1422   HGBUR SMALL (A) 04/09/2020 1422   BILIRUBINUR NEGATIVE 04/09/2020 1422   KETONESUR 5 (A) 04/09/2020 1422   PROTEINUR >=300 (A) 04/09/2020 1422   NITRITE NEGATIVE 04/09/2020 1422   LEUKOCYTESUR NEGATIVE 04/09/2020 1422    Sepsis Labs: Lactic Acid, Venous    Component Value Date/Time   LATICACIDVEN 2.3 (HH) 11/03/2018 1108    MICROBIOLOGY: Recent Results (from the past 240 hour(s))  SARS Coronavirus 2 by RT PCR (hospital order, performed in [facility] hospital lab) Nasopharyngeal Nasopharyngeal Swab     Status: None   Collection Time: 04/09/20  2:02 PM   Specimen: Nasopharyngeal Swab  Result Value Ref Range Status   SARS Coronavirus 2 NEGATIVE NEGATIVE Final    Comment: (NOTE) SARS-CoV-2 target nucleic acids are NOT DETECTED.  The SARS-CoV-2 RNA is generally detectable in upper and lower respiratory specimens during the acute phase of infection. The lowest concentration of SARS-CoV-2 viral copies this assay can detect is 250 copies / mL. A negative result does not preclude SARS-CoV-2 infection and should not be used as the sole basis for treatment or other patient management decisions.  A negative  result may occur with improper specimen collection / handling, submission of specimen other than nasopharyngeal swab, presence of viral mutation(s) within the areas targeted by this assay, and inadequate number of viral copies (<250 copies / mL). A negative result must be combined with clinical observations, patient history, and epidemiological information.  Fact Sheet for Patients:   BoilerBrush.com.cy  Fact Sheet for Healthcare Providers: https://pope.com/  This test is not yet approved or  cleared by the [place] FDA and has been authorized for detection and/or diagnosis of SARS-CoV-2 by FDA under an Emergency Use Authorization (EUA).  This EUA will remain in effect (meaning this test can be used) for the duration of the COVID-19 declaration under Section 564(b)(1) of the Act, 21 U.S.C. section 360bbb-3(b)(1), unless the authorization is terminated or revoked sooner.  Performed at [facility], [address], [place], [place] [address]     RADIOLOGY STUDIES/RESULTS: CT Head Wo Contrast  Result Date: 04/09/2020 CLINICAL DATA:  Altered mental status, facial trauma EXAM: CT HEAD WITHOUT CONTRAST CT CERVICAL SPINE WITHOUT CONTRAST TECHNIQUE: Multidetector CT imaging of the head and cervical spine was performed following the standard protocol without intravenous contrast. Multiplanar CT image reconstructions of the cervical spine were also generated. COMPARISON:  04/04/2020 FINDINGS: CT HEAD FINDINGS Brain: Normal anatomic configuration. Moderate parenchymal volume loss is again noted, unchanged. Extensive periventricular white matter changes are present likely reflecting the sequela of small vessel ischemia. No abnormal intra or extra-axial mass lesion or fluid collection. No abnormal mass effect or midline shift. No evidence of acute intracranial hemorrhage or infarct. Ventricular size is normal. Cerebellum unremarkable.  Vascular: Extensive atherosclerotic calcification noted within the carotid siphons and distal vertebral arteries. No hyperdense vasculature at the skull base. Skull: Intact Sinuses/Orbits: There is mild mucosal thickening within several ethmoid air cells bilaterally as well as the sphenoid sinuses bilaterally in keeping with mild paranasal sinus disease. Remaining paranasal sinuses are clear. Orbits are unremarkable. Other: Mastoid air cells and middle ear cavities are clear. CT CERVICAL SPINE FINDINGS Alignment: Normal cervical lordosis. No listhesis of the cervical spine. Skull base and vertebrae: The craniocervical junction is unremarkable. Atlantal dental interval is normal. Vertebral body height has been preserved. No acute fracture of the cervical spine. Soft tissues and spinal canal: Extensive asymmetric edema is again noted within  the right shoulder, incompletely evaluated on this examination, in keeping with given history of recent trauma. The paraspinal soft tissues are unremarkable. No visible canal hematoma. Disc levels: Multilevel mild uncovertebral and facet arthrosis is present with mild resultant bilateral neural foraminal narrowing at C4-5 and. Small posterior disc herniation is noted at C4-5. No significant canal stenosis noted. C5-6 Upper chest: The visualized lung apices are unremarkable. Other: None significant IMPRESSION: No acute intracranial injury.  No calvarial fracture. Mild paranasal sinus disease. No acute cervical spine fracture or listhesis. Mild multilevel degenerative changes with mild resultant bilateral neural foraminal narrowing at C4-5 and C5-6. Asymmetric soft tissue swelling within the visualized right shoulder, incompletely evaluated, in keeping with given history of recent trauma. Electronically Signed   By: Helyn Numbers MD   On: 04/09/2020 16:01   CT Cervical Spine Wo Contrast  Result Date: 04/09/2020 CLINICAL DATA:  Altered mental status, facial trauma EXAM: CT HEAD  WITHOUT CONTRAST CT CERVICAL SPINE WITHOUT CONTRAST TECHNIQUE: Multidetector CT imaging of the head and cervical spine was performed following the standard protocol without intravenous contrast. Multiplanar CT image reconstructions of the cervical spine were also generated. COMPARISON:  04/04/2020 FINDINGS: CT HEAD FINDINGS Brain: Normal anatomic configuration. Moderate parenchymal volume loss is again noted, unchanged. Extensive periventricular white matter changes are present likely reflecting the sequela of small vessel ischemia. No abnormal intra or extra-axial mass lesion or fluid collection. No abnormal mass effect or midline shift. No evidence of acute intracranial hemorrhage or infarct. Ventricular size is normal. Cerebellum unremarkable. Vascular: Extensive atherosclerotic calcification noted within the carotid siphons and distal vertebral arteries. No hyperdense vasculature at the skull base. Skull: Intact Sinuses/Orbits: There is mild mucosal thickening within several ethmoid air cells bilaterally as well as the sphenoid sinuses bilaterally in keeping with mild paranasal sinus disease. Remaining paranasal sinuses are clear. Orbits are unremarkable. Other: Mastoid air cells and middle ear cavities are clear. CT CERVICAL SPINE FINDINGS Alignment: Normal cervical lordosis. No listhesis of the cervical spine. Skull base and vertebrae: The craniocervical junction is unremarkable. Atlantal dental interval is normal. Vertebral body height has been preserved. No acute fracture of the cervical spine. Soft tissues and spinal canal: Extensive asymmetric edema is again noted within the right shoulder, incompletely evaluated on this examination, in keeping with given history of recent trauma. The paraspinal soft tissues are unremarkable. No visible canal hematoma. Disc levels: Multilevel mild uncovertebral and facet arthrosis is present with mild resultant bilateral neural foraminal narrowing at C4-5 and. Small  posterior disc herniation is noted at C4-5. No significant canal stenosis noted. C5-6 Upper chest: The visualized lung apices are unremarkable. Other: None significant IMPRESSION: No acute intracranial injury.  No calvarial fracture. Mild paranasal sinus disease. No acute cervical spine fracture or listhesis. Mild multilevel degenerative changes with mild resultant bilateral neural foraminal narrowing at C4-5 and C5-6. Asymmetric soft tissue swelling within the visualized right shoulder, incompletely evaluated, in keeping with given history of recent trauma. Electronically Signed   By: Helyn Numbers MD   On: 04/09/2020 16:01   DG Chest Portable 1 View  Result Date: 04/09/2020 CLINICAL DATA:  Altered mental status, multiple falls EXAM: PORTABLE CHEST 1 VIEW COMPARISON:  06/15/2019 FINDINGS: The heart size and mediastinal contours are within normal limits. No focal airspace consolidation, pleural effusion, or pneumothorax. Bilateral AC joint arthropathy. IMPRESSION: No active disease. Electronically Signed   By: Duanne Guess D.O.   On: 04/09/2020 13:45     LOS: 1 day  Jeoffrey MassedShanker Pasqualino Witherspoon, MD  [facility]    To contact the attending provider between 7A-7P or the covering provider during after hours 7P-7A, please log into the web site [email or web address] and access using universal [place] password for that web site. If you do not have the password, please call the hospital operator.  04/10/2020, 11:05 AM

## 2020-04-10 NOTE — ED Notes (Signed)
Again attempted to call daughter mail box full.

## 2020-04-11 DIAGNOSIS — R404 Transient alteration of awareness: Secondary | ICD-10-CM

## 2020-04-11 LAB — CBC WITH DIFFERENTIAL/PLATELET
Abs Immature Granulocytes: 0.02 10*3/uL (ref 0.00–0.07)
Basophils Absolute: 0.1 10*3/uL (ref 0.0–0.1)
Basophils Relative: 1 %
Eosinophils Absolute: 0.4 10*3/uL (ref 0.0–0.5)
Eosinophils Relative: 5 %
HCT: 34.9 % — ABNORMAL LOW (ref 36.0–46.0)
Hemoglobin: 11.4 g/dL — ABNORMAL LOW (ref 12.0–15.0)
Immature Granulocytes: 0 %
Lymphocytes Relative: 29 %
Lymphs Abs: 2 10*3/uL (ref 0.7–4.0)
MCH: 29 pg (ref 26.0–34.0)
MCHC: 32.7 g/dL (ref 30.0–36.0)
MCV: 88.8 fL (ref 80.0–100.0)
Monocytes Absolute: 0.4 10*3/uL (ref 0.1–1.0)
Monocytes Relative: 5 %
Neutro Abs: 4.1 10*3/uL (ref 1.7–7.7)
Neutrophils Relative %: 60 %
Platelets: 190 10*3/uL (ref 150–400)
RBC: 3.93 MIL/uL (ref 3.87–5.11)
RDW: 12.4 % (ref 11.5–15.5)
WBC: 7 10*3/uL (ref 4.0–10.5)
nRBC: 0 % (ref 0.0–0.2)

## 2020-04-11 LAB — FERRITIN: Ferritin: 98 ng/mL (ref 11–307)

## 2020-04-11 LAB — BASIC METABOLIC PANEL
Anion gap: 9 (ref 5–15)
BUN: 13 mg/dL (ref 8–23)
CO2: 25 mmol/L (ref 22–32)
Calcium: 8.5 mg/dL — ABNORMAL LOW (ref 8.9–10.3)
Chloride: 106 mmol/L (ref 98–111)
Creatinine, Ser: 0.87 mg/dL (ref 0.44–1.00)
GFR calc Af Amer: >60 mL/min (ref >60)
GFR calc non Af Amer: >60 mL/min (ref >60)
Glucose, Bld: 199 mg/dL — ABNORMAL HIGH (ref 70–99)
Potassium: 4.2 mmol/L (ref 3.5–5.1)
Sodium: 140 mmol/L (ref 135–145)

## 2020-04-11 LAB — GLUCOSE, CAPILLARY
Glucose-Capillary: 145 mg/dL — ABNORMAL HIGH (ref 70–99)
Glucose-Capillary: 179 mg/dL — ABNORMAL HIGH (ref 70–99)
Glucose-Capillary: 192 mg/dL — ABNORMAL HIGH (ref 70–99)
Glucose-Capillary: 141 mg/dL — ABNORMAL HIGH (ref 70–99)

## 2020-04-11 LAB — VITAMIN B12: Vitamin B-12: 169 pg/mL — ABNORMAL LOW (ref 180–914)

## 2020-04-11 LAB — IRON AND TIBC
Iron: 37 ug/dL (ref 28–170)
Saturation Ratios: 14 % (ref 10.4–31.8)
TIBC: 265 ug/dL (ref 250–450)
UIBC: 228 ug/dL

## 2020-04-11 MED ORDER — LIDOCAINE 5 % EX PTCH
1.0000 | MEDICATED_PATCH | CUTANEOUS | Status: DC
  Administered 2020-04-11 – 2020-04-12 (×2): 1 via TRANSDERMAL
  Filled 2020-04-11 (×2): qty 1

## 2020-04-11 MED ORDER — INSULIN ASPART 100 UNIT/ML ~~LOC~~ SOLN: 0.0000 [IU] | Freq: Three times a day (TID) | SUBCUTANEOUS | Status: DC

## 2020-04-11 MED ORDER — LISINOPRIL 10 MG PO TABS
10.0000 mg | ORAL_TABLET | Freq: Every day | ORAL | Status: DC
  Administered 2020-04-11 – 2020-04-12 (×2): 10 mg via ORAL
  Filled 2020-04-11 (×2): qty 1

## 2020-04-11 MED ORDER — INSULIN ASPART 100 UNIT/ML ~~LOC~~ SOLN
0.0000 [IU] | Freq: Three times a day (TID) | SUBCUTANEOUS | Status: DC
  Administered 2020-04-11 (×2): 1 [IU] via SUBCUTANEOUS
  Administered 2020-04-11 – 2020-04-12 (×3): 2 [IU] via SUBCUTANEOUS

## 2020-04-11 MED ORDER — INSULIN ASPART 100 UNIT/ML ~~LOC~~ SOLN: 0.0000 [IU] | Freq: Every day | SUBCUTANEOUS | Status: DC

## 2020-04-11 MED ORDER — OXYCODONE HCL 5 MG PO TABS
5.0000 mg | ORAL_TABLET | Freq: Four times a day (QID) | ORAL | Status: DC | PRN
  Administered 2020-04-11 – 2020-04-12 (×3): 5 mg via ORAL
  Filled 2020-04-11 (×3): qty 1

## 2020-04-11 MED ORDER — ROPINIROLE HCL 1 MG PO TABS
1.0000 mg | ORAL_TABLET | Freq: Every day | ORAL | Status: DC
  Administered 2020-04-11: 1 mg via ORAL
  Filled 2020-04-11: qty 1

## 2020-04-11 MED ORDER — INSULIN DETEMIR 100 UNIT/ML ~~LOC~~ SOLN
15.0000 [IU] | Freq: Every day | SUBCUTANEOUS | Status: DC
  Administered 2020-04-11: 15 [IU] via SUBCUTANEOUS
  Filled 2020-04-11 (×2): qty 0.15

## 2020-04-11 MED ORDER — INSULIN DETEMIR 100 UNIT/ML ~~LOC~~ SOLN
20.0000 [IU] | Freq: Every day | SUBCUTANEOUS | Status: DC
  Filled 2020-04-11: qty 0.2

## 2020-04-11 MED ORDER — POTASSIUM CHLORIDE CRYS ER 20 MEQ PO TBCR
20.0000 meq | EXTENDED_RELEASE_TABLET | Freq: Once | ORAL | Status: AC
  Administered 2020-04-11: 20 meq via ORAL
  Filled 2020-04-11: qty 1

## 2020-04-11 NOTE — Evaluation (Signed)
Occupational Therapy Evaluation Patient Details Name: Gabriella [name] MRN: [id number] DOB: Jun 17, 1941 Today's Date: 04/11/2020    History of Present Illness Pt is a 79 y/o female admitted secondary to confusion and multiple falls. Pt with recent R clavicular fx. PMH includes HTN, DM, and COPD.    Clinical Impression   PTA pt reports needing assist with ADLs and utilizing mobility aides. Pt was admitted for above and treated for problem list below (see OT Problem List). Pt is A&Ox2 disoriented to time and situation with slow processing with one step commands and problem solving. Pt having decreased awareness of safety and memory needing verbal cues to not move or WB through R arm and repeatedly asking therapist if they were married and had children. Requires min guard - Total A for ADLs due to decreased UE functional use and pain. Requires Mod A +2 for transfers for safety and decreased balance. Pt completed transfer from bed to recliner with use of Stedy and +2 assist for safety and assisting with stabilizing R UE for pain. Believe pt would benefit from skilled OT services acutely and at the SNF level to increase safety and return to PLOF.   Follow Up Recommendations  SNF;Supervision/Assistance - 24 hour (unless family/[facility] able to provide necessary assist)    Equipment Recommendations  Other (comment) (TBD at next venue of care)       Precautions / Restrictions Precautions Precautions: Fall Precaution Comments: Pt with multiple falls at home Restrictions Weight Bearing Restrictions: Yes RLE Weight Bearing: Non weight bearing Other Position/Activity Restrictions: Maintained NWB on RUE as pt with clavical fx.       Mobility Bed Mobility Overal bed mobility: Needs Assistance Bed Mobility: Supine to Sit     Supine to sit: Min assist;HOB elevated     General bed mobility comments: Min A for trunk elevation with HOB elevated  Transfers Overall transfer level: Needs  assistance Equipment used: 1 person hand held assist Antony Salmon) Transfers: Sit to/from [employer] Sit to Stand: Mod assist Stand pivot transfers: Mod assist;+2 physical assistance;+2 safety/equipment       General transfer comment: Mod A for lift off with 1 HHA with L UE. Need for support of R UE due to pain (+2_)    Balance Overall balance assessment: Needs assistance Sitting-balance support: No upper extremity supported;Feet supported Sitting balance-Leahy Scale: Fair     Standing balance support: Single extremity supported;During functional activity Standing balance-Leahy Scale: Poor Standing balance comment: Reliant on LUE and external support                           ADL either performed or assessed with clinical judgement   ADL Overall ADL's : Needs assistance/impaired     Grooming: Sitting;Min guard Grooming Details (indicate cue type and reason): Pt able to wash face with L UE with min guard Upper Body Bathing: Maximal assistance;Sitting Upper Body Bathing Details (indicate cue type and reason): Max A for decreased ROM in R UE Lower Body Bathing: Total assistance;Sit to/from stand Lower Body Bathing Details (indicate cue type and reason): Total A due to pain in R shoulder. unable to don socks Upper Body Dressing : Maximal assistance;Sitting;Cueing for sequencing;Cueing for compensatory techniques Upper Body Dressing Details (indicate cue type and reason): Max A with decreased strength/ROM in R UE Lower Body Dressing: Total assistance;Sit to/from stand Lower Body Dressing Details (indicate cue type and reason): Total A with donning socks due to pain in R  UE   Toilet Transfer Details (indicate cue type and reason): deferred due to pt safety Toileting- Clothing Manipulation and Hygiene: Total assistance;+2 for physical assistance;Sit to/from stand;Sitting/lateral lean Toileting - Clothing Manipulation Details (indicate cue type and reason): Total  A due to reliance on UE support with standing and lack of ROM with R UE   Tub/Shower Transfer Details (indicate cue type and reason): deferred due to pt safety Functional mobility during ADLs: Minimal assistance;Cueing for sequencing General ADL Comments: min guard to total assist due to decreased dominant UE use, pain, and decreased cognition                  Pertinent Vitals/Pain Pain Assessment: Faces Faces Pain Scale: Hurts even more Pain Location: R shoulder Pain Descriptors / Indicators: Aching;Discomfort;Grimacing;Guarding;Moaning Pain Intervention(s): Limited activity within patient's tolerance;Monitored during session     Hand Dominance Right   Extremity/Trunk Assessment Upper Extremity Assessment Upper Extremity Assessment: RUE deficits/detail RUE Deficits / Details: Recent R clavical fx   Lower Extremity Assessment Lower Extremity Assessment: Defer to PT evaluation   Cervical / Trunk Assessment Cervical / Trunk Assessment: Kyphotic   Communication Communication Communication: No difficulties   Cognition Arousal/Alertness: Awake/alert Behavior During Therapy: WFL for tasks assessed/performed Overall Cognitive Status: Impaired/Different from baseline Area of Impairment: Orientation;Attention;Memory;Following commands;Safety/judgement;Awareness;Problem solving                 Orientation Level: Disoriented to;Time;Situation Current Attention Level: Sustained Memory: Decreased short-term memory Following Commands: Follows one step commands with increased time Safety/Judgement: Decreased awareness of safety;Decreased awareness of deficits Awareness: Intellectual Problem Solving: Slow processing;Decreased initiation;Difficulty sequencing;Requires verbal cues;Requires tactile cues General Comments: Pt disoriented to time and situation. With slow processing with one step commands and problem solving. Pt having decreased awareness of safety and memory needing  verbal cues to not move or WB through R arm and repeatedly asking therapist if they were married and had children   General Comments  No family present during eval. Pt confused and limited historian            Home Living Family/patient expects to be discharged to:: Private residence Living Arrangements: Children Available Help at Discharge: Family;Available PRN/intermittently Type of Home: House Home Access: Level entry     Home Layout: One level               Home Equipment: Walker - 2 wheels;Walker - 4 wheels;Shower seat   Additional Comments: Active with pace      Prior Functioning/Environment Level of Independence: Needs assistance  Gait / Transfers Assistance Needed: Reports normally using RW for ambulation. Unsure of baseline since clavial fx ADL's / Homemaking Assistance Needed: Reports daughter has to assist her.             OT Problem List: Decreased strength;Decreased range of motion;Decreased activity tolerance;Impaired balance (sitting and/or standing);Decreased cognition;Decreased safety awareness;Decreased knowledge of use of DME or AE;Decreased knowledge of precautions;Impaired UE functional use;Pain      OT Treatment/Interventions: Self-care/ADL training;Therapeutic exercise;Energy conservation;DME and/or AE instruction;Manual therapy;Therapeutic activities;Cognitive remediation/compensation;Patient/family education;Balance training    OT Goals(Current goals can be found in the care plan section) Acute Rehab OT Goals Patient Stated Goal: to go home OT Goal Formulation: With patient Time For Goal Achievement: 04/25/20 Potential to Achieve Goals: Good  OT Frequency: Min 2X/week    AM-PAC OT "6 Clicks" Daily Activity     Outcome Measure Help from another person eating meals?: None Help from another person taking care of personal grooming?:  A Little Help from another person toileting, which includes using toliet, bedpan, or urinal?: Total Help from  another person bathing (including washing, rinsing, drying)?: Total Help from another person to put on and taking off regular upper body clothing?: A Lot Help from another person to put on and taking off regular lower body clothing?: Total 6 Click Score: 12   End of Session Equipment Utilized During Treatment: Gait belt;Other (comment) Antony Salmon) Nurse Communication: Mobility status;Patient requests pain meds  Activity Tolerance: Patient limited by fatigue;Patient limited by pain Patient left: with call bell/phone within reach;in chair;with chair alarm set  OT Visit Diagnosis: Unsteadiness on feet (R26.81);Repeated falls (R29.6);Muscle weakness (generalized) (M62.81);History of falling (Z91.81);Other symptoms and signs involving cognitive function;Pain Pain - Right/Left: Right Pain - part of body: Shoulder                Time: 1117-1202   Charges:  OT General Charges $OT Visit: 1 Visit OT Evaluation $OT Eval Moderate Complexity: 1 Mod OT Treatments $Self Care/Home Management : 23-37 mins  Josanna Hefel/OTS  Ethelyne Erich 04/11/2020, 1:46 PM

## 2020-04-11 NOTE — Progress Notes (Signed)
Inpatient Diabetes Program Recommendations  AACE/ADA: New Consensus Statement on Inpatient Glycemic Control (2015)  Target Ranges:  Prepandial:   less than 140 mg/dL      Peak postprandial:   less than 180 mg/dL (1-2 hours)      Critically ill patients:  140 - 180 mg/dL   Lab Results  Component Value Date   GLUCAP 179 (H) 04/11/2020   HGBA1C 10.3 (H) 04/09/2020    Review of Glycemic Control  Diabetes history: DM 2 Outpatient Diabetes medications: Semglee (generic Glargine) 24 units Daily, Metformin 1000 mg bid, Trulicity 0.75 weekly Current orders for Inpatient glycemic control:  Levemir 15 units qhs Novolog 0-9 units tid + hs  A1c 10.3%  Spoke with daughter over the phone regarding A1c level and glucose control at home. Pt is adamant about eating the wrong things at home. Pt does drink Diet dr pepper, however will eat french fries and other junk foods per daughter. Discussed glucose control. Discusse calling PCP at [facility] and getting insulin titrated for better glucose control. Daughter reports that she use to have a sliding scale at home for her mother, but then was changed to [facility] (generic glargine). Daughter reported at one time a doctor had her mom taking 75 units of Novolog, but she stopped it. Spoke to her about the possibility of the pt to be on both semglee and Novolog and be safe if needed and then she will have insulin ordered to bring down the glucose spikes.  Rather set up therapy through [facility] as pt would not want SNF and daughter would like to keep her at phone. Needs more nurse aid time per daughter  Thanks,  Christena Deem RN, MSN, BC-ADM Inpatient Diabetes Coordinator Team Pager [phone] (8a-5p)

## 2020-04-11 NOTE — Progress Notes (Signed)
Orthopedic Tech Progress Note Patient Details:  Gabriella [name] 06-09-1941 [id number]  Ortho Devices Type of Ortho Device: Shoulder immobilizer Ortho Device/Splint Location: RUE Ortho Device/Splint Interventions: Ordered, Adjustment   Post Interventions Patient Tolerated: Well Instructions Provided: Care of device   [name] 04/11/2020, 4:26 PM

## 2020-04-11 NOTE — Discharge Summary (Incomplete)
   Name: Gabriella [name] MRN: [id number] DOB: 10/28/1940 79 y.o. PCP: Estevan Oaks, NP  Date of Admission: 04/09/2020 12:46 PM Date of Discharge:  Attending Physician: Reymundo Poll, MD  Discharge Diagnosis: 1. ***  Discharge Medications: Allergies as of 04/11/2020      Reactions   Other Diarrhea, Nausea And Vomiting, Anxiety, Other (See Comments)   Unknown pain medication: caused sweating, nervousness, and "Some new pain med given at [facility], but I don't know what it's called." Patient states "it knocked me out and I got sweaty and they gave me benadryl."    Med Rec must be completed prior to using this [facility]***       Disposition and follow-up:   [name] was discharged from [facility] in {DISCHARGE CONDITION:19696} condition.  At the hospital follow up visit please address:  1.  ***  2.  Labs / imaging needed at time of follow-up: ***  3.  Pending labs/ test needing follow-up: ***  Follow-up Appointments:   Hospital Course by problem list: 1. ***  Discharge Vitals:   BP (!) 145/61 (BP Location: Right Wrist)   Pulse 76   Temp 97.9 F (36.6 C) (Oral)   Resp 13   SpO2 99%   Pertinent Labs, Studies, and Procedures:  ***  Discharge Instructions:   SignedVersie Starks, DO 04/11/2020, 8:15 AM   Pager: [phone]

## 2020-04-11 NOTE — Progress Notes (Addendum)
Subjective:    Gabriella [name] is [name] 79yo female with PMH of hypertension, TIIDM, peripheral artery disease, TIA, early dementia, and COPD who presented 7/21 with AMS from her physician's office, found to have AKI secondary to dehydration. She underwent CT head and cervical spine, and x-ray, all without acute findings except for some tissue swelling of the right shoulder shoulder.  She recently (7/16) fell while trying to get into [name] bus with [facility] and suffered [name] nondisplaced right clavicular shoulder fracture. She was given [name] short course of opioid pain medication, and per her aide, has had declining mental status since that time. Her daughter also thought that she may have been taking some of her other centrally acting medications for pain since her opioid prescription was only for [name] couple of days. Overall, it was thought that her recent trauma, decreased po intake, and increased medications all contributed to her AMS.   Her centrally acting medications, including neurontin 600 mg bid, cymbalta, 60 mg qd, requip 1 mg qhs, tramadol 50 mg q6h, trazodone 150 mg qhs, melatonin, and oxycodone were all held at admission.   Yesterday, she had improvement in mental status, answering quetsions appropriately, but continued to be tired.   Today, she states she is doing ok although she continues to have shoulder pain. She is not sure where she is but is otherwise orientedx2. She remembers falling recently. She denies nausea, abdominal pain, other pain, shortness of breath, difficulty with urination or BM. She also endorses history of restless leg syndrome.   Her daughter, Gabriella [name], is the point of contact. Spoke with her this morning, and she states her mom is usually has [name] good memory although she was worked up for dementia in the past. She saw her mom yesterday and states she seemed back to baseline and normal mentation. They were supposed to follow-up with ortho outpatient but missed the appointment due to  having to come to the hospital.    Objective:  Vital signs in last 24 hours: Vitals:   04/10/20 1900 04/10/20 2347 04/11/20 0300 04/11/20 0353  BP:  (!) 123/58  (!) 145/61  Pulse:  77  76  Resp:  19  13  Temp: 98.1 F (36.7 C) 98.2 F (36.8 C) 97.9 F (36.6 C) 97.9 F (36.6 C)  TempSrc: Oral Oral Oral Oral  SpO2:  96%  99%   Constitution: NAD, appears stated age HENT: [place]/AT Cardio: RRR, no m/r/g, no LE edema  Respiratory: CTA, no w/r/r Abdominal: NTTP, soft, non-distended MSK: moving all extremities, rhythmic movement RLE, tenderness over right AC joint Neuro: normal affect, [name]&ox3 Skin: hematoma over anterior arm.   7/21>> CT head: No acute intracranial abnormality 7/21>> CT /C-spine: No acute cervical spine fracture 7/21>> chest x-ray: No active disease. 7/16>> x-ray right shoulder: Nondisplaced fracture of the distal clavicle  Assessment/Plan:  Active Problems:   Acute encephalopathy   Gabriella [name] is [name] 79yo female with PMH of hypertension, TIIDM, peripheral artery disease, TIA, and COPD who presented with AMS from her physician's office.   Acute Encephalopathy Likely secondary to multiple centrally acting medications, dehydration, recent trauma, and 2/2 increased pain. Mental status continuing to improve, more alert today compared to yesterday's note but still not sure where she is. Daughter also states she appears back to baseline. She does have history of mild dementia and lives at home with her daughter.   - will restart requip as her restless leg does seem to be bothering her - I do  think her severe pain is limiting her recovery and will start [name] lower dose of pain medication prn - PT/OT consulted, recommending SNF vs. Return home w/24 hours assistance through [facility]. TOC consulted for possible SNF placement   Right Clavicular Fracture AC Joint Injury Recent distal right clavicular fracture with some elevation of clavicle to the [facility] joint, likely type I-II injury.  Missed f/u appointment due to hospital admission.   - will discuss with ortho to determine limitations of working with PT - oxycodne 5 mg q6h for pain  - cont. Scheduled tylenol - ICE, lidocaine patch prn  AKI - resolved  COPD - cont. Breo ellipta 200-25  Hx of Rt DVT 2020 - cont. Eliquis  HTN HLD Lisinopril held for AKI. Can restart today.   - cont. Home statin - cont. toprol 25 mg qd  Type II Diabetes Home medications include lantus 24U, metformin 1000 mg bid, and trulicity .75 qweekly.   - cont. lantus 15U and ssi sensitive   Normocytic Anemia Chronic, baseline ~8. Do not see previous iron studies or work-up. No previous colonoscopy on file. She is on metformin, which could contribute to [name] B12 deficiency.   - B12, ferritin, IBC, iron   VTE: eliquis IVF: none Diet: carb modified  Code: full   Dispo: Anticipated discharge pending SNF placement.   Gabriella Gane A, DO 04/11/2020, 5:41 AM Pager: [phone] After 5pm on weekdays and 1pm on weekends: On Call Pager: [phone]

## 2020-04-11 NOTE — TOC Initial Note (Signed)
Transition of Care [facility]) - Initial/Assessment Note    Patient Details  Name: Gabriella [name] MRN: [id number] Date of Birth: October 23, 1940  [facility] CM/SW Contact:    Baldemar Lenis, LCSW Phone Number: 04/11/2020, 4:04 PM  Clinical Narrative:       CSW spoke with patient's daughter, Jaymes Graff, about recommendation for SNF. Daughter wants to take the patient home and continue to [facility] to [facility], hopeful that the patient can go to [facility] 5 days a week. CSW contacted [facility], asked to speak to the patient's social worker. Patient's social worker on vacation, and unsure who is covering. [facility] called back to discuss discharge plans, and said that the patient has been approved for an additional day at the center but they can only do three days a week. CSW updated [facility] that the daughter wants to take the patient home. [facility] cannot provide transportation over the weekend, so daughter will have to or [facility] is ok with [facility], if need be. CSW to follow.            Expected Discharge Plan: Home/Self Care Barriers to Discharge: Continued Medical Work up, English as a second language teacher   Patient Goals and CMS Choice Patient states their goals for this hospitalization and ongoing recovery are:: patient unable to participate in goal setting due to disorientation CMS Medicare.gov Compare Post Acute Care list provided to:: Patient Represenative (must comment) Choice offered to / list presented to : Adult Children  Expected Discharge Plan and Services Expected Discharge Plan: Home/Self Care     Post Acute Care Choice: NA Living arrangements for the past 2 months: Single Family Home                                      Prior Living Arrangements/Services Living arrangements for the past 2 months: Single Family Home Lives with:: Adult Children Patient language and need for interpreter reviewed:: No Do you feel safe going back to the place where you live?: Yes      Need for Family Participation in  Patient Care: Yes (Comment) Care giver support system in place?: Yes (comment)   Criminal Activity/Legal Involvement Pertinent to Current Situation/Hospitalization: No - Comment as needed  Activities of Daily Living      Permission Sought/Granted Permission sought to share information with : Facility Medical sales representative, Family Supports Permission granted to share information with : Yes, Verbal Permission Granted  Share Information with NAME: Jaymes Graff  Permission granted to share info w AGENCY: [facility]  Permission granted to share info w Relationship: Daughter     Emotional Assessment   Attitude/Demeanor/Rapport: Unable to Assess Affect (typically observed): Unable to Assess        Admission diagnosis:  Transient alteration of awareness [R40.4] Acute encephalopathy [G93.40] Patient Active Problem List   Diagnosis Date Noted   Transient alteration of awareness    Acute encephalopathy 04/09/2020   Bleeding 11/20/2018   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)    Peripheral vascular disease (HCC) 11/05/2018   AKI (acute kidney injury) (HCC)    Diarrhea    Hyperglycemia    Gastroenteritis 11/02/2018   Pressure injury of skin 12/04/2017   Diabetic ketoacidosis (HCC) 12/03/2017   Acute respiratory failure with hypoxia (HCC) 12/02/2017   Influenza B 12/02/2017   PCP:  Estevan Oaks, NP Pharmacy:   [id number] - [place], [place] - [address] AT [address]OSS  [address] [place] [address] Phone: [phone] Fax: [phone]     Social Determinants of Health (SDOH) Interventions    Readmission Risk Interventions Readmission Risk Prevention Plan 12/01/2018  Transportation Screening Complete  Medication Review Oceanographer) Patient Refused  PCP or Specialist appointment within 3-5 days of discharge Complete  HRI or Home Care Consult Not Complete  HRI or Home Care Consult Pt Refusal  Comments Patient will go to SNF.  SW Recovery Care/Counseling Consult Patient refused  Palliative Care Screening Not Applicable  Skilled Nursing Facility Complete

## 2020-04-12 LAB — CBC WITH DIFFERENTIAL/PLATELET
Abs Immature Granulocytes: 0.03 10*3/uL (ref 0.00–0.07)
Basophils Absolute: 0.1 10*3/uL (ref 0.0–0.1)
Basophils Relative: 1 %
Eosinophils Absolute: 0.4 10*3/uL (ref 0.0–0.5)
Eosinophils Relative: 5 %
HCT: 35.7 % — ABNORMAL LOW (ref 36.0–46.0)
Hemoglobin: 12.2 g/dL (ref 12.0–15.0)
Immature Granulocytes: 0 %
Lymphocytes Relative: 30 %
Lymphs Abs: 2.5 10*3/uL (ref 0.7–4.0)
MCH: 29.6 pg (ref 26.0–34.0)
MCHC: 34.2 g/dL (ref 30.0–36.0)
MCV: 86.7 fL (ref 80.0–100.0)
Monocytes Absolute: 0.5 10*3/uL (ref 0.1–1.0)
Monocytes Relative: 6 %
Neutro Abs: 4.7 10*3/uL (ref 1.7–7.7)
Neutrophils Relative %: 58 %
Platelets: 223 10*3/uL (ref 150–400)
RBC: 4.12 MIL/uL (ref 3.87–5.11)
RDW: 12.5 % (ref 11.5–15.5)
WBC: 8.1 10*3/uL (ref 4.0–10.5)
nRBC: 0 % (ref 0.0–0.2)

## 2020-04-12 LAB — BASIC METABOLIC PANEL
Anion gap: 11 (ref 5–15)
BUN: 10 mg/dL (ref 8–23)
CO2: 26 mmol/L (ref 22–32)
Calcium: 9.2 mg/dL (ref 8.9–10.3)
Chloride: 103 mmol/L (ref 98–111)
Creatinine, Ser: 0.85 mg/dL (ref 0.44–1.00)
GFR calc Af Amer: >60 mL/min (ref >60)
GFR calc non Af Amer: >60 mL/min (ref >60)
Glucose, Bld: 161 mg/dL — ABNORMAL HIGH (ref 70–99)
Potassium: 3.6 mmol/L (ref 3.5–5.1)
Sodium: 140 mmol/L (ref 135–145)

## 2020-04-12 LAB — GLUCOSE, CAPILLARY
Glucose-Capillary: 160 mg/dL — ABNORMAL HIGH (ref 70–99)
Glucose-Capillary: 198 mg/dL — ABNORMAL HIGH (ref 70–99)

## 2020-04-12 NOTE — Progress Notes (Addendum)
   Subjective: Patient reports that she wants to go home. She reports that she still has some right shoulder pain however that it's manageable with the pain medications. Based on notes by CSW appears that daughter would like her to return home with [facility]. Discussed that we will follow up with social work and her daughter to make sure we have a good plan for discharge.   Objective:  Vital signs in last 24 hours: Vitals:   04/11/20 2138 04/12/20 0006 04/12/20 0400 04/12/20 0439  BP: (!) 164/76 (!) 162/76 (!) 169/79 (!) 163/83  Pulse:  90 92   Resp:  14 19   Temp:  98.1 F (36.7 C) 98.8 F (37.1 C)   TempSrc:  Oral Oral   SpO2:  96% 96%    General: Eldery female, NAD, laying in bed Cardiac: RRR, no m/r/g Pulmonary: CTABL, no wheezing, rhonchi, rales MSK: Moves all extremities, tenderness over right AC joint, sling in place  Assessment/Plan:  Active Problems:   Acute encephalopathy   Transient alteration of awareness  Gabriella [name] is a 79yo female with PMH of hypertension, TIIDM, peripheral artery disease, TIA, and COPD who presented with AMS from her physician's office.   Acute Encephalopathy Likely secondary to multiple centrally acting medications, dehydration, recent trauma, and 2/2 increased pain. Patient alert today, requesting to go home with daughter.. Daughter also states she appears back to baseline. She does have history of mild dementia and lives at home with her daughter. PT/OT was recommending SNF vs 24 hour care. Patient and daughter both would like patient to return home.    - Continue requip as her restless leg does seem to be bothering her - Continue OxyIR 5 mg q6 hr, on tramadol at home - PT/OT consulted, recommending SNF vs. Return home w/24 hours assistance through [facility].  - Patient and daughter both would like patient to return home. Likely d/c today pending transportation arragement  Right Clavicular Fracture AC Joint Injury Recent distal right clavicular  fracture with some elevation of clavicle to the [facility] joint, likely type I-II injury. Missed f/u appointment due to hospital admission. Has shoulder immobilizer in place. Do not move above head.   - oxycodne 5 mg q6h for pain  - cont. Scheduled tylenol - ICE, lidocaine patch prn  AKI - resolved  COPD - cont. Breo ellipta 200-25  Hx of Rt DVT 2020 - cont. Eliquis  HTN HLD Lisinopril held for AKI. Restarted, Cr stable.   - cont. Home statin - cont. toprol 25 mg qd  Type II Diabetes Home medications include lantus 24U, metformin 1000 mg bid, and trulicity .75 qweekly.   - cont. lantus 15U and ssi sensitive   Normocytic Anemia Chronic, baseline ~8. Do not see previous iron studies or work-up. No previous colonoscopy on file. She is on metformin, which could contribute to a B12 deficiency.   - B12, ferritin, IBC, iron   Dispo: Anticipated discharge in approximately 0 day(s).   Claudean Severance, MD 04/12/2020, 6:33 AM Pager: [phone] After 5pm on weekdays and 1pm on weekends: On Call pager [phone]

## 2020-04-12 NOTE — Progress Notes (Signed)
Occupational Therapy Treatment Patient Details Name: Gabriella [name] MRN: [id number] DOB: [birth year] Today's Date: 04/12/2020    History of present illness Pt is a 79 y/o female admitted secondary to confusion and multiple falls. Pt with recent R clavicular fx. PMH includes HTN, DM, and COPD.    OT comments  Patient continues to make steady progress towards goals in skilled OT session. Patient's session encompassed functional mobility in order to increase overall activity tolerance and further assessment of cognition. Pt remains adament about going home, pleading with the doctor when therapist was in the room. Pt continues to require education with regard to weight bearing status on RUE, however able to adhere with multi-modal cues. Pt requiring increase in assist to sit EOB, and BP reading was 187/144 (132) with patient reporting minimal dizziness. Once dizziness subsided, BP taken again with result of 199/124 (142). RN notified and present for the second reading, and assisted therapist in getting patient adjusted in bed. Discharge plan remains appropriate at this time; will continue to follow acutely.    Follow Up Recommendations  SNF;Supervision/Assistance - 24 hour (Unless family/[facility] can provide assist)    Equipment Recommendations  Other (comment) (Defer to next venue)    Recommendations for Other Services      Precautions / Restrictions Precautions Precautions: Fall Precaution Comments: Pt with multiple falls at home Restrictions Weight Bearing Restrictions: Yes RUE Weight Bearing: Non weight bearing Other Position/Activity Restrictions: Maintained NWB on RUE as pt with clavical fx.        Mobility Bed Mobility Overal bed mobility: Needs Assistance Bed Mobility: Supine to Sit     Supine to sit: Mod assist;HOB elevated;Max assist Sit to supine: Supervision   General bed mobility comments: Required significant increased in trunk elevation to come to sitting EOB  Transfers                  General transfer comment: Defered due to BP    Balance Overall balance assessment: Needs assistance Sitting-balance support: No upper extremity supported;Feet supported Sitting balance-Gabriella [name] Scale: Fair                                     ADL either performed or assessed with clinical judgement   ADL Overall ADL's : Needs assistance/impaired Eating/Feeding: Set up;Bed level   Grooming: Wash/dry hands;Wash/dry face;Bed level                               Functional mobility during ADLs: Moderate assistance;Cueing for safety;Cueing for sequencing General ADL Comments: Mod A due to clavicle fx, limited session due to elevated BP at EOB     Vision       Perception     Praxis      Cognition Arousal/Alertness: Awake/alert Behavior During Therapy: WFL for tasks assessed/performed Overall Cognitive Status: Impaired/Different from baseline Area of Impairment: Orientation;Attention;Memory;Following commands;Safety/judgement;Awareness;Problem solving                 Orientation Level: Disoriented to;Time Current Attention Level: Sustained Memory: Decreased short-term memory;Decreased recall of precautions Following Commands: Follows one step commands with increased time Safety/Judgement: Decreased awareness of safety;Decreased awareness of deficits Awareness: Intellectual Problem Solving: Slow processing;Decreased initiation;Difficulty sequencing;Requires verbal cues;Requires tactile cues General Comments: Pt with minimal increase in orientation, however definite STM deficits noted, pt required reminders to maintain NWB status, but knew she had "  broken her shoulder bone", able to follow one step commands with increased time        Exercises     Shoulder Instructions       General Comments      Pertinent Vitals/ Pain       Pain Assessment: Faces Faces Pain Scale: Hurts a little bit Pain Location: R shoulder Pain  Descriptors / Indicators: Aching;Discomfort;Grimacing;Guarding Pain Intervention(s): Limited activity within patient's tolerance;Monitored during session;RN gave pain meds during session  Home Living                                          Prior Functioning/Environment              Frequency  Min 2X/week        Progress Toward Goals  OT Goals(current goals can now be found in the care plan section)  Progress towards OT goals: Progressing toward goals  Acute Rehab OT Goals Patient Stated Goal: to go home OT Goal Formulation: With patient Time For Goal Achievement: 04/25/20 Potential to Achieve Goals: Fair  Plan Discharge plan remains appropriate    Co-evaluation                 AM-PAC OT "6 Clicks" Daily Activity     Outcome Measure   Help from another person eating meals?: None Help from another person taking care of personal grooming?: A Little Help from another person toileting, which includes using toliet, bedpan, or urinal?: Total Help from another person bathing (including washing, rinsing, drying)?: Total Help from another person to put on and taking off regular upper body clothing?: A Lot Help from another person to put on and taking off regular lower body clothing?: Total 6 Click Score: 12    End of Session Equipment Utilized During Treatment: Other (comment) (Sling RUE)  OT Visit Diagnosis: Unsteadiness on feet (R26.81);Repeated falls (R29.6);Muscle weakness (generalized) (M62.81);History of falling (Z91.81);Other symptoms and signs involving cognitive function;Pain Pain - Right/Left: Right Pain - part of body: Shoulder   Activity Tolerance Treatment limited secondary to medical complications (Comment) (Elevated BP levels sitting EOB (see note))   Patient Left in bed;with bed alarm set   Nurse Communication Mobility status        Time: 1443-1540 OT Time Calculation (min): 29 min  Charges: OT General Charges $OT Visit:  1 Visit OT Treatments $Self Care/Home Management : 23-37 mins  Pollyann Glen E. Gregroy Dombkowski, COTA/L Acute Rehabilitation Services [phone]   [name] 04/12/2020, 11:00 AM

## 2020-04-12 NOTE — Discharge Summary (Signed)
Name: Gabriella [name] MRN: [id number] DOB: [birth year] 79 y.o. PCP: Estevan Oaks, NP  Date of Admission: 04/09/2020 12:46 PM Date of Discharge: 04/12/20 Attending Physician: Reymundo Poll, MD  Discharge Diagnosis: 1. Acute encephalopathy 2. Right Clavicular Fracture 3. AC Joint Injury 4.AKI 5.COPD 6.Hx of Rt DVT 2020 7.HTN, HLD 8.Type II Diabetes 9.Normocytic Anemia  Discharge Medications: Allergies as of 04/12/2020      Reactions   Other Diarrhea, Nausea And Vomiting, Anxiety, Other (See Comments)   Unknown pain medication: caused sweating, nervousness, and "Some new pain med given at [facility], but I don't know what it's called." Patient states "it knocked me out and I got sweaty and they gave me benadryl."      Medication List    STOP taking these medications   enoxaparin 100 MG/ML injection Commonly known as: LOVENOX   gabapentin 600 MG tablet Commonly known as: NEURONTIN   insulin aspart 100 UNIT/ML FlexPen Commonly known as: NovoLOG FlexPen     TAKE these medications   acetaminophen 650 MG CR tablet Commonly known as: TYLENOL Take 650 mg by mouth in the morning, at noon, and at bedtime.   acetaminophen 500 MG tablet Commonly known as: TYLENOL Take 500 mg by mouth 2 (two) times daily as needed for moderate pain.   Advair Diskus 250-50 MCG/DOSE Aepb Generic drug: Fluticasone-Salmeterol Inhale 1 puff into the lungs 2 (two) times daily.   Alpha-Lipoic Acid 600 MG Caps Take 600 mg by mouth in the morning, at noon, and at bedtime.   Balmex 11.3 % Crea cream Generic drug: zinc oxide Apply 1 application topically 2 (two) times daily. To buttocks. Also as needed after toileting   BIOFREEZE EX Apply 1 application topically in the morning and at bedtime.   Biotene Dry Mouth Moisturizing Soln Take 1 spray by mouth See admin instructions. Six times daily   chlorhexidine 0.12 % solution Commonly known as: PERIDEX Use as directed 5 mLs in  the mouth or throat See admin instructions. Brush on 1 tsp. of solution to teeth and gums with a toothbrush after PM mouth care. Spit out excess and do not rinse.   DULoxetine 60 MG capsule Commonly known as: CYMBALTA Take 60 mg by mouth daily.   Eliquis 5 MG Tabs tablet Generic drug: apixaban Take 5 mg by mouth 2 (two) times daily.   HM LIDOCAINE PATCH EX Apply 1 patch topically daily. Leave on 12 hours then off 12 hours   hydroxypropyl methylcellulose / hypromellose 2.5 % ophthalmic solution Commonly known as: ISOPTO TEARS / GONIOVISC Place 1 drop into both eyes in the morning, at noon, in the evening, and at bedtime.   ipratropium-albuterol 0.5-2.5 (3) MG/3ML Soln Commonly known as: DUONEB Take 3 mLs by nebulization in the morning and at bedtime. Can take midday and evening as needed for wheezing and shortness of breath. What changed: Another medication with the same name was removed. Continue taking this medication, and follow the directions you see here.   lisinopril 10 MG tablet Commonly known as: ZESTRIL Take 10 mg by mouth daily.   metFORMIN 1000 MG tablet Commonly known as: GLUCOPHAGE Take 1,000 mg by mouth 2 (two) times daily with a meal.   metoprolol succinate 25 MG 24 hr tablet Commonly known as: TOPROL-XL Take 25 mg by mouth daily.   mineral oil-hydrophilic petrolatum ointment Apply 1 application topically daily. And after bathing.   nystatin powder Generic drug: nystatin Apply 1 application topically 3 (three) times daily.  Omega-3 1000 MG Caps Take 2,000 mg by mouth in the morning and at bedtime.   rOPINIRole 1 MG tablet Commonly known as: REQUIP Take 1 mg by mouth at bedtime.   rosuvastatin 20 MG tablet Commonly known as: CRESTOR Take 20 mg by mouth at bedtime.   Salonpas Pain Rel Gel-Ptch Hot 0.025-1.25 % Ptch Generic drug: Capsaicin-Menthol Apply 1 patch topically in the morning, at noon, and at bedtime.   Semglee 100 UNIT/ML  injection Generic drug: insulin glargine Inject 24 Units into the skin daily.   traMADol 50 MG tablet Commonly known as: ULTRAM Take 50 mg by mouth every 6 (six) hours as needed for moderate pain.   traZODone 150 MG tablet Commonly known as: DESYREL Take 150 mg by mouth at bedtime.   Trulicity 0.75 MG/0.5ML Sopn Generic drug: Dulaglutide Inject 0.75 mg into the skin once a week.   Vitamin D3 1.25 MG (50000 UT) Caps Take 50,000 Units by mouth every 30 (thirty) days.   Voltaren 1 % Gel Generic drug: diclofenac Sodium Apply 2 g topically 4 (four) times daily.       Disposition and follow-up:   Ms.Vidhi Mutch was discharged from [facility] in Stable condition.  At the hospital follow up visit please address:  1.  Acute encephalopathy: Likely 2/2 polypharmacy, held gabapentin and advised to take medications as prescribed. Wean centrally acting medications as able.   Right clavicle fracture: Needs to follow up with orthopedics  Normocytic anemia: B12 low at 169. Will need to be started on B12.   2.  Labs / imaging needed at time of follow-up: None  3.  Pending labs/ test needing follow-up: None  Follow-up Appointments:  Follow-up Information    Estevan Oaks, NP. Schedule an appointment as soon as possible for a visit in 1 week(s).   Specialty: Nurse Practitioner Contact information: [address] [place] [address] [phone]               Hospital Course by problem list: 1. Acute Encephalopathy: This is a 79 year old female with history of hypertension, diabetes type 2, PAD, TIA, and COPD presented with worsening altered mental status and confusion.  Noted to have a recent fall on 7/16 in had been started on pain medications, noted to have been taking other home medications for worsening pain including her home trazodone.  Patient was noted to be afebrile, tachycardic, mildly hypertensive with a new oxygen requirement.  Noted to have a  mild AKI with creatinine 1.24.  CT head and CT cervical spine negative for acute fracture or acute intracranial injury.  Chest x-ray unremarkable.  EKG showed sinus tachycardia.  Home medications held with improvement in her mental status.  Felt to be secondary to her polypharmacy.  Patient continued to have left shoulder pain and was given small doses of OxyIR while admitted. Oxygen was weaned off to room air. Patient was back to her baseline on day of discharge.  Patient was advised to hold her gabapentin on discharge, and advised to take her medications as prescribed.  PT and OT were recommending SNF on discharge however patient in patient's daughter to return home.  Patient is a [facility] patient they are agreeable to increasing frequency of [facility] visits.  2. Right Clavicular Fracture: 3. AC Joint Injury: Recent distal right clavicular fracture with some elevation of clavicle to the [facility] joint, likely type I-II injury. Missed f/u appointment due to hospital admission. Advised to continue tylenol and tramadol as  needed. Continued ICE and lidocaine patch prn  4.AKI: Cr up to 1.24 on admission, back to baseline on discharge.   5.COPD: Did not appear to have exacerbation. initially started on 4L [place], weaned off to room air. Continued Breo ellipta 200-25  6.Hx of Rt DVT 2020: Continued home Eliquis  7.HTN, HLD: Lisinopril held for AKI. Initially, resumed during hospitalization and on discharge. Resumed home statin and toprol.   8.Type II Diabetes: Home medications include lantus 24U, metformin 1000 mg bid, and trulicity .75 qweekly. Resumed on discharge  9.Normocytic Anemia: Chronic and at baseline. Obtained B12, ferritin, IBC, and iron. B12 low at 169. Will need to be started on B12.   Discharge Vitals:   BP (!) 157/66 (BP Location: Left Leg)    Pulse 95    Temp 97.8 F (36.6 C) (Oral)    Resp 13    SpO2 95%   Pertinent Labs, Studies, and Procedures:  CBC Latest Ref Rng & Units 04/12/2020  04/11/2020 04/10/2020  WBC 4.0 - 10.5 K/uL 8.1 7.0 9.2  Hemoglobin 12.0 - 15.0 g/dL 92.4 11.4(L) 10.8(L)  Hematocrit 36 - 46 % 35.7(L) 34.9(L) 32.4(L)  Platelets 150 - 400 K/uL 223 190 180   BMP Latest Ref Rng & Units 04/12/2020 04/11/2020 04/10/2020  Glucose 70 - 99 mg/dL 268(T) 419(Q) 222(L)  BUN 8 - 23 mg/dL 10 13 21   Creatinine 0.44 - 1.00 mg/dL 7.98 9.21  Sodium 135 - 145 mmol/L 140 140 138  Potassium 3.5 - 5.1 mmol/L 3.6 4.2 3.3(L)  Chloride 98 - 111 mmol/L 103 106 99  CO2 22 - 32 mmol/L 26 25 32  Calcium 8.9 - 10.3 mg/dL 9.2 1.94) 8.1(L)   CXR IMPRESSION: No active disease.  CT head, CT cervical spine: IMPRESSION: No acute intracranial injury.  No calvarial fracture.  Mild paranasal sinus disease.  No acute cervical spine fracture or listhesis.  Mild multilevel degenerative changes with mild resultant bilateral neural foraminal narrowing at C4-5 and C5-6.  Asymmetric soft tissue swelling within the visualized right shoulder, incompletely evaluated, in keeping with given history of recent trauma.  Discharge Instructions: Discharge Instructions    Diet - low sodium heart healthy   Complete by: As directed    Increase activity slowly   Complete by: As directed       Signed: 1.7(E, MD 04/12/2020, 1:39 PM   Pager: [phone]

## 2020-04-12 NOTE — Discharge Instructions (Signed)
Gabriella [name],   It has been a pleasure working with you and we are glad you're feeling better. You were hospitalized for confusion. We think that this was due to the medications that you are on at home in addition to the pain medications. Please hold your gabapentin for now. Please take your medications as prescribed. Try taking tylenol for your shoulder pain. You can take tramadol as needed for your pain, only take it when you are having severe pain.     Follow up with your primary care provider in 1-2 weeks  If your symptoms worsen or you develop new symptoms, please seek medical help whether it is your primary care provider or emergency department.

## 2020-04-12 NOTE — TOC Progression Note (Addendum)
Transition of Care [facility]) - Progression Note    Patient Details  Name: Meagen Limones MRN: [id number] Date of Birth: [birth year]  Transition of Care [facility] CM/SW Contact  Nonda Lou, [place] Phone Number: 04/12/2020, 11:36 AM  Clinical Narrative:    2:10p Patient's s daughter Jaymes Graff stated she will pick patient up for discharge. CSW signing off.  11:36a CSW contacted [facility] and left a message with transportation. CSW awaiting a call back.   Expected Discharge Plan: Home/Self Care Barriers to Discharge: Continued Medical Work up, English as a second language teacher  Expected Discharge Plan and Services Expected Discharge Plan: Home/Self Care     Post Acute Care Choice: NA Living arrangements for the past 2 months: Single Family Home                                       Social Determinants of Health (SDOH) Interventions    Readmission Risk Interventions Readmission Risk Prevention Plan 12/01/2018  Transportation Screening Complete  Medication Review Oceanographer) Patient Refused  PCP or Specialist appointment within 3-5 days of discharge Complete  HRI or Home Care Consult Not Complete  HRI or Home Care Consult Pt Refusal Comments Patient will go to SNF.  SW Recovery Care/Counseling Consult Patient refused  Palliative Care Screening Not Applicable  Skilled Nursing Facility Complete

## 2020-04-17 ENCOUNTER — Ambulatory Visit (INDEPENDENT_AMBULATORY_CARE_PROVIDER_SITE_OTHER): Payer: Medicare (Managed Care)

## 2020-04-17 ENCOUNTER — Ambulatory Visit (INDEPENDENT_AMBULATORY_CARE_PROVIDER_SITE_OTHER): Payer: Medicare (Managed Care) | Admitting: Physician Assistant

## 2020-04-17 ENCOUNTER — Encounter: Payer: Self-pay | Admitting: Physician Assistant

## 2020-04-17 DIAGNOSIS — S42001A Fracture of unspecified part of right clavicle, initial encounter for closed fracture: Secondary | ICD-10-CM | POA: Diagnosis not present

## 2020-04-17 MED ORDER — TRAMADOL HCL 50 MG PO TABS: 50.0000 mg | ORAL_TABLET | Freq: Four times a day (QID) | ORAL | 0 refills | Status: DC | PRN

## 2020-04-17 NOTE — Progress Notes (Signed)
Office Visit Note   Patient: Gabriella [name]           Date of Birth: [birth year]           MRN: [id number] Visit Date: 04/17/2020              Requested by: Estevan Oaks, NP [address] [place],  [place] [address] PCP: Estevan Oaks, NP  Chief Complaint  Patient presents with  . Right Shoulder - Follow-up    DOI 04/04/20 non dispalced distal clavicle fx       HPI: Patient is 2 weeks status post fall.  She sustained a nondisplaced distal clavicle fracture.  She comes in today complaining of pain in is not in a sling.  She says she does not know where her sling is  Assessment & Plan: Visit Diagnoses:  1. Closed nondisplaced fracture of right clavicle, unspecified part of clavicle, initial encounter     Plan: Patient will begin pendulum exercises and moving her hand and wrist.  We will give her a sling for comfort but I emphasized not to begin this all at all the time.  I did give her a refill on her tramadol follow-up in 3 weeks  Follow-Up Instructions: No follow-ups on file.   Ortho Exam  Patient is alert, oriented, no adenopathy, well-dressed, normal affect, normal respiratory effort. Patient is right-hand dominant.  She is tender over the distal clavicle there is associated bruising going down in her arm but she is not tender to palpation there she is using her hand without difficulty distal CMS is intact she is able to abduct her arm no cellulitis compartments are soft  Imaging: No results found. No images are attached to the encounter.  Labs: Lab Results  Component Value Date   HGBA1C 10.3 (H) 04/09/2020   HGBA1C 11.5 (H) 11/02/2018   REPTSTATUS 11/07/2018 FINAL 11/02/2018   CULT  11/02/2018    NO GROWTH 5 DAYS Performed at [facility], [address], [place], [place] [address]      Lab Results  Component Value Date   ALBUMIN 3.1 (L) 04/09/2020   ALBUMIN 2.2 (L) 11/21/2018   ALBUMIN 2.2 (L) 11/11/2018    Lab Results  Component Value  Date   MG 1.7 04/10/2020   MG 1.7 11/02/2018   No results found for: VD25OH  No results found for: PREALBUMIN CBC EXTENDED Latest Ref Rng & Units 04/12/2020 04/11/2020 04/10/2020  WBC 4.0 - 10.5 K/uL 8.1 7.0 9.2  RBC 3.87 - 5.11 MIL/uL 4.12 3.93 3.62(L)  HGB 12.0 - 15.0 g/dL 61.4 11.4(L) 10.8(L)  HCT 36 - 46 % 35.7(L) 34.9(L) 32.4(L)  PLT 150 - 400 K/uL 223 190 180  NEUTROABS 1.7 - 7.7 K/uL 4.7 4.1 5.3  LYMPHSABS 0.7 - 4.0 K/uL 2.5 2.0 2.9     There is no height or weight on file to calculate BMI.  Orders:  Orders Placed This Encounter  Procedures  . XR Clavicle Right   Meds ordered this encounter  Medications  . traMADol (ULTRAM) 50 MG tablet    Sig: Take 1 tablet (50 mg total) by mouth every 6 (six) hours as needed for moderate pain.    Dispense:  30 tablet    Refill:  0     Procedures: No procedures performed  Clinical Data: No additional findings.  ROS:  All other systems negative, except as noted in the HPI. Review of Systems  Objective: Vital Signs: There were no  vitals taken for this visit.  Specialty Comments:  No specialty comments available.  PMFS History: Patient Active Problem List   Diagnosis Date Noted  . Transient alteration of awareness   . Acute encephalopathy 04/09/2020  . Bleeding 11/20/2018  . 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 Peripheral vascular disease (HCC) 11/05/2018  . AKI (acute kidney injury) (HCC)   . Diarrhea   . Hyperglycemia   . Gastroenteritis 11/02/2018  . Pressure injury of skin 12/04/2017  . Diabetic ketoacidosis (HCC) 12/03/2017  . Acute respiratory failure with hypoxia (HCC) 12/02/2017  . Influenza B 12/02/2017   Past Medical History:  Diagnosis Date  . Arthritis   . COPD (chronic obstructive pulmonary disease) (HCC)   . Diabetes mellitus without complication (HCC)   . Hypertension     No family history on file.  Past Surgical History:  Procedure Laterality Date  . CATARACT  EXTRACTION Right 11/2017  . EMBOLECTOMY Left 11/05/2018   Procedure: EMBOLECTOMY LEFT POPLITEAL ARTERY;  Surgeon: Sherren Kerns, MD;  Location: [facility] OR;  Service: Vascular;  Laterality: Left;  . FASCIOTOMY Left 11/05/2018   Procedure: Four compartment Fasciotomy left lower leg;  Surgeon: Sherren Kerns, MD;  Location: [facility] OR;  Service: Vascular;  Laterality: Left;  . I & D EXTREMITY Left 11/20/2018   Procedure: IRRIGATION AND DEBRIDEMENT LEFT LOWER EXTREMITY;  Surgeon: Sherren Kerns, MD;  Location: [facility] OR;  Service: Vascular;  Laterality: Left;  . PATCH ANGIOPLASTY Left 11/05/2018   Procedure: Patch Angioplasty LEFT POPLITEAL ARTERY;  Surgeon: Sherren Kerns, MD;  Location: [facility] OR;  Service: Vascular;  Laterality: Left;   Social History   Occupational History  . Not on file  Tobacco Use  . Smoking status: Passive Smoke Exposure - Never Smoker  . Smokeless tobacco: Never Used  Vaping Use  . Vaping Use: Never used  Substance and Sexual Activity  . Alcohol use: No  . Drug use: No  . Sexual activity: Never

## 2020-05-08 ENCOUNTER — Ambulatory Visit: Payer: Medicare (Managed Care) | Admitting: Physician Assistant

## 2020-08-08 ENCOUNTER — Ambulatory Visit
Admission: RE | Admit: 2020-08-08 | Discharge: 2020-08-08 | Disposition: A | Discharge status: Home / Self Care | Payer: Medicare (Managed Care) | Source: Ambulatory Visit | Attending: Internal Medicine | Admitting: Internal Medicine

## 2020-08-08 ENCOUNTER — Other Ambulatory Visit: Payer: Self-pay | Admitting: Internal Medicine

## 2020-08-08 ENCOUNTER — Other Ambulatory Visit: Payer: Self-pay

## 2020-08-08 DIAGNOSIS — M25562 Pain in left knee: Secondary | ICD-10-CM

## 2020-08-08 DIAGNOSIS — M25561 Pain in right knee: Secondary | ICD-10-CM

## 2020-11-18 ENCOUNTER — Other Ambulatory Visit: Payer: Self-pay | Admitting: Nurse Practitioner

## 2020-11-18 ENCOUNTER — Ambulatory Visit
Admission: RE | Admit: 2020-11-18 | Discharge: 2020-11-18 | Disposition: A | Discharge status: Home / Self Care | Payer: No Typology Code available for payment source | Source: Ambulatory Visit | Attending: Nurse Practitioner | Admitting: Nurse Practitioner

## 2020-11-18 DIAGNOSIS — M25562 Pain in left knee: Secondary | ICD-10-CM

## 2020-11-18 DIAGNOSIS — R109 Unspecified abdominal pain: Secondary | ICD-10-CM

## 2020-11-18 DIAGNOSIS — W19XXXA Unspecified fall, initial encounter: Secondary | ICD-10-CM

## 2020-11-26 ENCOUNTER — Ambulatory Visit
Admission: RE | Admit: 2020-11-26 | Discharge: 2020-11-26 | Disposition: A | Discharge status: Home / Self Care | Payer: No Typology Code available for payment source | Source: Ambulatory Visit | Attending: Nurse Practitioner | Admitting: Nurse Practitioner

## 2020-11-26 ENCOUNTER — Other Ambulatory Visit: Payer: Self-pay | Admitting: Nurse Practitioner

## 2020-11-26 DIAGNOSIS — W19XXXA Unspecified fall, initial encounter: Secondary | ICD-10-CM

## 2020-11-26 DIAGNOSIS — M549 Dorsalgia, unspecified: Secondary | ICD-10-CM

## 2021-02-13 ENCOUNTER — Other Ambulatory Visit: Payer: Self-pay | Admitting: Family Medicine

## 2021-02-13 ENCOUNTER — Ambulatory Visit
Admission: RE | Admit: 2021-02-13 | Discharge: 2021-02-13 | Disposition: A | Discharge status: Home / Self Care | Payer: Medicare (Managed Care) | Source: Ambulatory Visit | Attending: Family Medicine | Admitting: Family Medicine

## 2021-02-13 DIAGNOSIS — R053 Chronic cough: Secondary | ICD-10-CM

## 2021-05-04 ENCOUNTER — Emergency Department (HOSPITAL_COMMUNITY): Payer: Medicare (Managed Care)

## 2021-05-04 ENCOUNTER — Encounter (HOSPITAL_COMMUNITY): Payer: Self-pay | Admitting: Emergency Medicine

## 2021-05-04 ENCOUNTER — Emergency Department (HOSPITAL_COMMUNITY)
Admission: EM | Admit: 2021-05-04 | Discharge: 2021-05-04 | Disposition: A | Discharge status: Home / Self Care | Payer: Medicare (Managed Care) | Attending: Emergency Medicine | Admitting: Emergency Medicine

## 2021-05-04 DIAGNOSIS — Z7951 Long term (current) use of inhaled steroids: Secondary | ICD-10-CM | POA: Insufficient documentation

## 2021-05-04 DIAGNOSIS — N390 Urinary tract infection, site not specified: Secondary | ICD-10-CM

## 2021-05-04 DIAGNOSIS — Z79899 Other long term (current) drug therapy: Secondary | ICD-10-CM | POA: Insufficient documentation

## 2021-05-04 DIAGNOSIS — Z794 Long term (current) use of insulin: Secondary | ICD-10-CM | POA: Diagnosis not present

## 2021-05-04 DIAGNOSIS — Z7901 Long term (current) use of anticoagulants: Secondary | ICD-10-CM | POA: Insufficient documentation

## 2021-05-04 DIAGNOSIS — I10 Essential (primary) hypertension: Secondary | ICD-10-CM | POA: Insufficient documentation

## 2021-05-04 DIAGNOSIS — F0390 Unspecified dementia without behavioral disturbance: Secondary | ICD-10-CM | POA: Diagnosis not present

## 2021-05-04 DIAGNOSIS — J209 Acute bronchitis, unspecified: Secondary | ICD-10-CM | POA: Diagnosis not present

## 2021-05-04 DIAGNOSIS — E11649 Type 2 diabetes mellitus with hypoglycemia without coma: Secondary | ICD-10-CM | POA: Diagnosis not present

## 2021-05-04 DIAGNOSIS — E162 Hypoglycemia, unspecified: Secondary | ICD-10-CM

## 2021-05-04 DIAGNOSIS — Z7722 Contact with and (suspected) exposure to environmental tobacco smoke (acute) (chronic): Secondary | ICD-10-CM | POA: Diagnosis not present

## 2021-05-04 DIAGNOSIS — Z7984 Long term (current) use of oral hypoglycemic drugs: Secondary | ICD-10-CM | POA: Diagnosis not present

## 2021-05-04 DIAGNOSIS — J440 Chronic obstructive pulmonary disease with acute lower respiratory infection: Secondary | ICD-10-CM | POA: Insufficient documentation

## 2021-05-04 DIAGNOSIS — R4182 Altered mental status, unspecified: Secondary | ICD-10-CM | POA: Diagnosis present

## 2021-05-04 LAB — I-STAT CHEM 8, ED
BUN: 26 mg/dL — ABNORMAL HIGH (ref 8–23)
Calcium, Ion: 1.25 mmol/L (ref 1.15–1.40)
Chloride: 106 mmol/L (ref 98–111)
Creatinine, Ser: 1.6 mg/dL — ABNORMAL HIGH (ref 0.44–1.00)
Glucose, Bld: 165 mg/dL — ABNORMAL HIGH (ref 70–99)
HCT: 28 % — ABNORMAL LOW (ref 36.0–46.0)
Hemoglobin: 9.5 g/dL — ABNORMAL LOW (ref 12.0–15.0)
Potassium: 4.4 mmol/L (ref 3.5–5.1)
Sodium: 141 mmol/L (ref 135–145)
TCO2: 28 mmol/L (ref 22–32)

## 2021-05-04 LAB — COMPREHENSIVE METABOLIC PANEL
ALT: 28 U/L (ref 0–44)
AST: 22 U/L (ref 15–41)
Albumin: 2.7 g/dL — ABNORMAL LOW (ref 3.5–5.0)
Alkaline Phosphatase: 56 U/L (ref 38–126)
Anion gap: 6 (ref 5–15)
BUN: 26 mg/dL — ABNORMAL HIGH (ref 8–23)
CO2: 27 mmol/L (ref 22–32)
Calcium: 9.1 mg/dL (ref 8.9–10.3)
Chloride: 107 mmol/L (ref 98–111)
Creatinine, Ser: 1.67 mg/dL — ABNORMAL HIGH (ref 0.44–1.00)
GFR, Estimated: 31 mL/min — ABNORMAL LOW (ref >60)
Glucose, Bld: 173 mg/dL — ABNORMAL HIGH (ref 70–99)
Potassium: 4.4 mmol/L (ref 3.5–5.1)
Sodium: 140 mmol/L (ref 135–145)
Total Bilirubin: 0.5 mg/dL (ref 0.3–1.2)
Total Protein: 6.1 g/dL — ABNORMAL LOW (ref 6.5–8.1)

## 2021-05-04 LAB — CBC
HCT: 30.8 % — ABNORMAL LOW (ref 36.0–46.0)
Hemoglobin: 9.7 g/dL — ABNORMAL LOW (ref 12.0–15.0)
MCH: 29.5 pg (ref 26.0–34.0)
MCHC: 31.5 g/dL (ref 30.0–36.0)
MCV: 93.6 fL (ref 80.0–100.0)
Platelets: 130 10*3/uL — ABNORMAL LOW (ref 150–400)
RBC: 3.29 MIL/uL — ABNORMAL LOW (ref 3.87–5.11)
RDW: 14.6 % (ref 11.5–15.5)
WBC: 5.8 10*3/uL (ref 4.0–10.5)
nRBC: 0 % (ref 0.0–0.2)

## 2021-05-04 LAB — URINALYSIS, ROUTINE W REFLEX MICROSCOPIC
Bacteria, UA: NONE SEEN
Bilirubin Urine: NEGATIVE
Glucose, UA: NEGATIVE mg/dL
Ketones, ur: NEGATIVE mg/dL
Nitrite: NEGATIVE
Protein, ur: 100 mg/dL — AB
Specific Gravity, Urine: 1.013 (ref 1.005–1.030)
WBC, UA: >50 WBC/hpf — ABNORMAL HIGH (ref 0–5)
pH: 6 (ref 5.0–8.0)

## 2021-05-04 LAB — CBG MONITORING, ED
Glucose-Capillary: 148 mg/dL — ABNORMAL HIGH (ref 70–99)
Glucose-Capillary: 131 mg/dL — ABNORMAL HIGH (ref 70–99)
Glucose-Capillary: 109 mg/dL — ABNORMAL HIGH (ref 70–99)

## 2021-05-04 LAB — I-STAT VENOUS BLOOD GAS, ED
Acid-Base Excess: 1 mmol/L (ref 0.0–2.0)
Bicarbonate: 28.6 mmol/L — ABNORMAL HIGH (ref 20.0–28.0)
Calcium, Ion: 1.27 mmol/L (ref 1.15–1.40)
HCT: 28 % — ABNORMAL LOW (ref 36.0–46.0)
Hemoglobin: 9.5 g/dL — ABNORMAL LOW (ref 12.0–15.0)
O2 Saturation: 80 %
Potassium: 4.4 mmol/L (ref 3.5–5.1)
Sodium: 142 mmol/L (ref 135–145)
TCO2: 30 mmol/L (ref 22–32)
pCO2, Ven: 62.2 mmHg — ABNORMAL HIGH (ref 44.0–60.0)
pH, Ven: 7.271 (ref 7.250–7.430)
pO2, Ven: 51 mmHg — ABNORMAL HIGH (ref 32.0–45.0)

## 2021-05-04 MED ORDER — IPRATROPIUM BROMIDE 0.02 % IN SOLN
0.5000 mg | Freq: Once | RESPIRATORY_TRACT | Status: AC
  Administered 2021-05-04: 0.5 mg via RESPIRATORY_TRACT
  Filled 2021-05-04: qty 2.5

## 2021-05-04 MED ORDER — SODIUM CHLORIDE 0.9% FLUSH: 3.0000 mL | INTRAVENOUS | Status: DC | PRN

## 2021-05-04 MED ORDER — SODIUM CHLORIDE 0.9% FLUSH
3.0000 mL | Freq: Two times a day (BID) | INTRAVENOUS | Status: DC
  Administered 2021-05-04: 3 mL via INTRAVENOUS

## 2021-05-04 MED ORDER — METHYLPREDNISOLONE SODIUM SUCC 125 MG IJ SOLR
125.0000 mg | Freq: Once | INTRAMUSCULAR | Status: AC
  Administered 2021-05-04: 125 mg via INTRAVENOUS
  Filled 2021-05-04: qty 2

## 2021-05-04 MED ORDER — SODIUM CHLORIDE 0.9 % IV BOLUS
1000.0000 mL | Freq: Once | INTRAVENOUS | Status: AC
  Administered 2021-05-04: 1000 mL via INTRAVENOUS

## 2021-05-04 MED ORDER — DEXTROSE 50 % IV SOLN: 1.0000 | INTRAVENOUS | Status: DC | PRN

## 2021-05-04 MED ORDER — SODIUM CHLORIDE 0.9 % IV SOLN: 250.0000 mL | INTRAVENOUS | Status: DC | PRN

## 2021-05-04 MED ORDER — ALBUTEROL SULFATE HFA 108 (90 BASE) MCG/ACT IN AERS: 2.0000 | INHALATION_SPRAY | RESPIRATORY_TRACT | 0 refills | Status: AC | PRN

## 2021-05-04 MED ORDER — PREDNISONE 10 MG PO TABS: 40.0000 mg | ORAL_TABLET | Freq: Every day | ORAL | 0 refills | Status: AC

## 2021-05-04 MED ORDER — CEPHALEXIN 500 MG PO CAPS: 500.0000 mg | ORAL_CAPSULE | Freq: Three times a day (TID) | ORAL | 0 refills | Status: AC

## 2021-05-04 MED ORDER — ALBUTEROL SULFATE (2.5 MG/3ML) 0.083% IN NEBU
2.5000 mg | INHALATION_SOLUTION | Freq: Once | RESPIRATORY_TRACT | Status: AC
  Administered 2021-05-04: 2.5 mg via RESPIRATORY_TRACT
  Filled 2021-05-04: qty 3

## 2021-05-04 MED ORDER — SODIUM CHLORIDE 0.9 % IV SOLN
1.0000 g | Freq: Once | INTRAVENOUS | Status: AC
  Administered 2021-05-04: 1 g via INTRAVENOUS
  Filled 2021-05-04: qty 10

## 2021-05-04 NOTE — ED Notes (Signed)
Report was attempted to [facility] for the pt returning back to facility. Payroll answered the phone & mentioned that social work usually took report but they were not present to take it. This RN was on hold to speak with someone that Payroll transferred the call to, nobody answered the phone. [facility] is leaving with pt at this time, D/C papers were given to them, report was attempted.

## 2021-05-04 NOTE — ED Triage Notes (Signed)
Pt here from [facility] nursing home with  c/o aloc , pt cbg was 36 , was given oral glucose at nursing home and D10 from ems , cbg to 140's , pt hx of dementia

## 2021-05-04 NOTE — Discharge Instructions (Addendum)
Please ensure patient's blood sugar levels are checked 2-3 times (every 12 or 8 hours) per day. Please ensure the patient is eating appropriately.   She will be treated for possible urinary tract infection with Keflex.  Additionally, she is getting prednisone for her COPD. She should get 4 puffs of albuterol every 4-6 hours for the next 24 hour (during the day time). After that, she can have 2-4 puff every 4-6 hours as needed for wheezing and/or shortness of breath.

## 2021-05-04 NOTE — ED Notes (Signed)
Nursing facility updated

## 2021-05-04 NOTE — ED Provider Notes (Addendum)
[facility] EMERGENCY DEPARTMENT Provider Note  CSN: [id number] Arrival date & time: 05/04/21 2878  Chief Complaint(s) No chief complaint on file.  HPI Gabriella [name] is a 80 y.o. 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  Per EMS staff reports patient was screaming out.  They checked her sugar and noted she was 37.  She was given oral glucose.  EMS called who gave the patient a small bolus of D10.  Repeat sugar 140s.  Patient follows commands but does not communicate verbally.  EMS reports that her nursing facility staff patient at baseline is alert and demented.  Remainder of history, ROS, and physical exam limited due to patient's condition (dementia). Additional information was obtained from EMS.   Level V Caveat.   The history is provided by the EMS personnel.   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 Authorizing Provider  albuterol (VENTOLIN HFA) 108 (90 Base) MCG/ACT inhaler Inhale 2-4 puffs into the lungs every 4 (four) hours as needed for  up to 15 days for wheezing or shortness of breath. 05/04/21 05/19/21 Yes Tahjae Clausing, Amadeo Garnet, MD  cephALEXin (KEFLEX) 500 MG capsule Take 1 capsule (500 mg total) by mouth 3 (three) times daily for 7 days. 05/04/21 05/11/21 Yes Shea Swalley, Amadeo Garnet, MD  predniSONE (DELTASONE) 10 MG tablet Take 4 tablets (40 mg total) by mouth daily for 4 days. 05/04/21 05/08/21 Yes Helmi Hechavarria, Amadeo Garnet, MD  acetaminophen (TYLENOL) 500 MG tablet Take 500 mg by mouth 2 (two) times daily as needed for moderate pain.    [provider]  acetaminophen (TYLENOL) 650 MG CR tablet Take 650 mg by mouth in the morning, at noon, and at bedtime.     [provider]  Alpha-Lipoic Acid 600 MG CAPS Take 600 mg by mouth in the morning, at noon, and at bedtime.    [provider]  apixaban (ELIQUIS) 5 MG TABS tablet Take 5 mg by mouth 2 (two) times daily.    [provider]  Artificial Saliva (BIOTENE DRY MOUTH MOISTURIZING) SOLN Take 1 spray by mouth See admin instructions. Six times daily    [provider]  Capsaicin-Menthol (SALONPAS PAIN REL GEL-PTCH HOT) 0.025-1.25 % PTCH Apply 1 patch topically in the morning, at noon, and at bedtime.    [provider]  chlorhexidine (PERIDEX) 0.12 % solution Use as directed 5 mLs in the mouth or throat See admin instructions. Brush on 1 tsp. of solution to teeth and gums with a toothbrush after PM mouth care. Spit out excess and do  not rinse.    [provider]  Cholecalciferol (VITAMIN D3) 1.25 MG (50000 UT) CAPS Take 50,000 Units by mouth every 30 (thirty) days.     [provider]  diclofenac Sodium (VOLTAREN) 1 % GEL Apply 2 g topically 4 (four) times daily.    [provider]  DULoxetine (CYMBALTA) 60 MG capsule Take 60 mg by mouth daily. 12/02/16   [provider]  Fluticasone-Salmeterol (ADVAIR DISKUS) 250-50 MCG/DOSE AEPB Inhale 1 puff into the lungs 2 (two) times daily.    [provider]   HM LIDOCAINE PATCH EX Apply 1 patch topically daily. Leave on 12 hours then off 12 hours    [provider]  hydroxypropyl methylcellulose / hypromellose (ISOPTO TEARS / GONIOVISC) 2.5 % ophthalmic solution Place 1 drop into both eyes in the morning, at noon, in the evening, and at bedtime.    [provider]  insulin glargine (SEMGLEE) 100 UNIT/ML injection Inject 24 Units into the skin daily.    [provider]  ipratropium-albuterol (DUONEB) 0.5-2.5 (3) MG/3ML SOLN Take 3 mLs by nebulization in the morning and at bedtime. Can take midday and evening as needed for wheezing and shortness of breath.    [provider]  lisinopril (ZESTRIL) 10 MG tablet Take 10 mg by mouth daily.    [provider]  Menthol, Topical Analgesic, (BIOFREEZE EX) Apply 1 application topically in the morning and at bedtime.     [provider]  metFORMIN (GLUCOPHAGE) 1000 MG tablet Take 1,000 mg by mouth 2 (two) times daily with a meal.    [provider]  metoprolol succinate (TOPROL-XL) 25 MG 24 hr tablet Take 25 mg by mouth daily.    [provider]  mineral oil-hydrophilic petrolatum (AQUAPHOR) ointment Apply 1 application topically daily. And after bathing.    [provider]  nystatin (NYSTATIN) powder Apply 1 application topically 3 (three) times daily.    [provider]  Omega-3 1000 MG CAPS Take 2,000 mg by mouth in the morning and at bedtime.    [provider]  rOPINIRole (REQUIP) 1 MG tablet Take 1 mg by mouth at bedtime.    [provider]  rosuvastatin (CRESTOR) 20 MG tablet Take 20 mg by mouth at bedtime.    [provider]  traMADol (ULTRAM) 50 MG tablet Take 1 tablet (50 mg total) by mouth every 6 (six) hours as needed for moderate pain. 04/17/20   Persons, [place], [place]  traZODone (DESYREL) 150 MG tablet Take 150 mg by mouth at bedtime.    [provider]  TRULICITY 0.75 MG/0.5ML  SOPN Inject 0.75 mg into the skin once a week. 03/14/20   [provider]  zinc oxide (BALMEX) 11.3 % CREA cream Apply 1 application topically 2 (two) times daily. To buttocks. Also as needed after toileting    [provider]  Past Surgical History Past Surgical History:  Procedure Laterality Date   CATARACT EXTRACTION Right 11/2017   EMBOLECTOMY Left 11/05/2018   Procedure: EMBOLECTOMY LEFT POPLITEAL ARTERY;  Surgeon: Sherren Kerns, MD;  Location: [facility] OR;  Service: Vascular;  Laterality: Left;   FASCIOTOMY Left 11/05/2018   Procedure: Four compartment Fasciotomy left lower leg;  Surgeon: Sherren Kerns, MD;  Location: [facility] OR;  Service: Vascular;  Laterality: Left;   I & D EXTREMITY Left 11/20/2018   Procedure: IRRIGATION AND DEBRIDEMENT LEFT LOWER EXTREMITY;  Surgeon: Sherren Kerns, MD;  Location: [facility] OR;  Service: Vascular;  Laterality: Left;   PATCH ANGIOPLASTY Left 11/05/2018   Procedure: Patch Angioplasty LEFT POPLITEAL ARTERY;  Surgeon: Sherren Kerns, MD;  Location: [facility] OR;  Service: Vascular;  Laterality: Left;   Family History No family history on file.  Social History Social History   Tobacco Use   Smoking status: Passive Smoke Exposure - Never Smoker   Smokeless tobacco: Never  Vaping Use   Vaping Use: Never used  Substance Use Topics   Alcohol use: No   Drug use: No   Allergies Other  Review of Systems Review of Systems Unable to obtain due to dementia Physical Exam Vital Signs  I have reviewed the triage vital signs BP (!) 153/94   Pulse 70   Temp 98.9 F (37.2 C) (Oral)   Resp 19   SpO2 100%   Physical Exam Vitals reviewed.  Constitutional:      General: She is not in acute distress.    Appearance: She is well-developed. She is not diaphoretic.  HENT:     Head: Normocephalic and atraumatic.      Nose: Nose normal.  Eyes:     General: No scleral icterus.       Right eye: No discharge.        Left eye: No discharge.     Conjunctiva/sclera: Conjunctivae normal.     Pupils: Pupils are equal, round, and reactive to light.  Cardiovascular:     Rate and Rhythm: Normal rate and regular rhythm.     Heart sounds: No murmur heard.   No friction rub. No gallop.  Pulmonary:     Effort: Pulmonary effort is normal. No respiratory distress.     Breath sounds: No stridor. Rhonchi present. No rales.  Abdominal:     General: There is no distension.     Palpations: Abdomen is soft.     Tenderness: There is no abdominal tenderness.  Musculoskeletal:        General: No tenderness.     Cervical back: Normal range of motion and neck supple.  Skin:    General: Skin is warm and dry.     Findings: No erythema or rash.  Neurological:     Mental Status: She is alert.     Comments: Nonverbal. Follows commands and moves all extremities. Answers yes or no by nodding or shaking her head. No facial droop, smile intact. Ocular movements intact.    ED Results and Treatments Labs (all labs ordered are listed, but only abnormal results are displayed) Labs Reviewed  COMPREHENSIVE METABOLIC PANEL - Abnormal; Notable for the following components:      Result Value   Glucose, Bld 173 (*)    BUN 26 (*)    Creatinine, Ser 1.67 (*)    Total Protein 6.1 (*)    Albumin 2.7 (*)    GFR, Estimated 31 (*)    All other components  within normal limits  URINALYSIS, ROUTINE W REFLEX MICROSCOPIC - Abnormal; Notable for the following components:   APPearance HAZY (*)    Hgb urine dipstick SMALL (*)    Protein, ur 100 (*)    Leukocytes,Ua SMALL (*)    WBC, UA >50 (*)    All other components within normal limits  CBC - Abnormal; Notable for the following components:   RBC 3.29 (*)    Hemoglobin 9.7 (*)    HCT 30.8 (*)    Platelets 130 (*)    All other components within normal limits  CBG MONITORING, ED -  Abnormal; Notable for the following components:   Glucose-Capillary 148 (*)    All other components within normal limits  CBG MONITORING, ED - Abnormal; Notable for the following components:   Glucose-Capillary 131 (*)    All other components within normal limits  I-STAT CHEM 8, ED - Abnormal; Notable for the following components:   BUN 26 (*)    Creatinine, Ser 1.60 (*)    Glucose, Bld 165 (*)    Hemoglobin 9.5 (*)    HCT 28.0 (*)    All other components within normal limits  I-STAT VENOUS BLOOD GAS, ED - Abnormal; Notable for the following components:   pCO2, Ven 62.2 (*)    pO2, Ven 51.0 (*)    Bicarbonate 28.6 (*)    HCT 28.0 (*)    Hemoglobin 9.5 (*)    All other components within normal limits  CBG MONITORING, ED - Abnormal; Notable for the following components:   Glucose-Capillary 109 (*)    All other components within normal limits  CBG MONITORING, ED                                                                                                                         EKG  EKG Interpretation  Date/Time:    Ventricular Rate:    PR Interval:    QRS Duration:   QT Interval:    QTC Calculation:   R Axis:     Text Interpretation:         Radiology DG Chest 1 View  Result Date: 05/04/2021 CLINICAL DATA:  Wheezing EXAM: CHEST  1 VIEW COMPARISON:  04/05/2021 FINDINGS: Patchy left lower lobe opacity, similar to the prior, favoring atelectasis, although pneumonia is not excluded. Cardiomegaly. No frank interstitial edema. No definite pleural effusions. No pneumothorax. Degenerative changes of the thoracic spine. IMPRESSION: Patchy left lower lobe opacity, likely atelectasis, pneumonia not excluded. Cardiomegaly.  No frank interstitial edema. Electronically Signed   By: Charline Bills M.D.   On: 05/04/2021 02:52   CT HEAD WO CONTRAST ( )  Result Date: 05/04/2021 CLINICAL DATA:  Altered mental status. EXAM: CT HEAD WITHOUT CONTRAST TECHNIQUE: Contiguous axial images  were obtained from the base of the skull through the vertex without intravenous contrast. COMPARISON:  April 04, 2020 FINDINGS: Brain: There is moderate severity cerebral atrophy with widening of the extra-axial spaces and ventricular  dilatation. There are areas of decreased attenuation within the white matter tracts of the supratentorial brain, consistent with microvascular disease changes. Tiny, chronic bilateral basal ganglia lacunar infarcts are seen. Vascular: No hyperdense vessel or unexpected calcification. Skull: Normal. Negative for fracture or focal lesion. Sinuses/Orbits: Mild to moderate severity bilateral maxillary sinus, bilateral ethmoid sinus and sphenoid sinus mucosal thickening is seen. Other: None. IMPRESSION: 1. No acute intracranial abnormality. 2. 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 3. Pansinus disease. Electronically Signed   By: Aram Candelahaddeus  Houston M.D.   On: 05/04/2021 03:33    Pertinent labs & imaging results that were available during my care of the patient were reviewed by me and considered in my medical decision making (see MDM for details).  Medications Ordered in ED Medications  sodium chloride flush (NS) 0.9 % injection 3 mL (3 mLs Intravenous Given 05/04/21 0512)  sodium chloride flush (NS) 0.9 % injection 3 mL (has no administration in time range)  0.9 %  sodium chloride infusion (has no administration in time range)  dextrose 50 % solution 50 mL (has no administration in time range)  cefTRIAXone (ROCEPHIN) 1 g in sodium chloride 0.9 % 100 mL IVPB (0 g Intravenous Stopped 05/04/21 0627)  sodium chloride 0.9 % bolus 1,000 mL (1,000 mLs Intravenous New Bag/Given 05/04/21 0513)  albuterol (PROVENTIL) (2.5 MG/3ML) 0.083% nebulizer solution 2.5 mg (2.5 mg Nebulization Given 05/04/21 0646)  ipratropium (ATROVENT) nebulizer solution 0.5 mg (0.5 mg Nebulization Given 05/04/21 0646)  methylPREDNISolone sodium succinate (SOLU-MEDROL) 125 mg/2 mL  injection 125 mg (125 mg Intravenous Given 05/04/21 0646)                                                                                                                                     Procedures Procedures  (including critical care time)  Medical Decision Making / ED Course I have reviewed the nursing notes for this encounter and the patient's prior records (if available in EHR or on provided paperwork).  Gabriella [name] was evaluated in Emergency Department on 05/04/2021 for the symptoms described in the history of present illness. She was evaluated in the context of the global COVID-19 pandemic, which necessitated consideration that the patient might be at risk for infection with the SARS-CoV-2 virus that causes COVID-19. Institutional protocols and algorithms that pertain to the evaluation of patients at risk for COVID-19 are in a state of rapid change based on information released by regulatory bodies including the CDC and federal and state organizations. These policies and algorithms were followed during the patient's care in the ED.     Altered mental status in the setting of hypoglycemia. Will obtain screening labs and serial CBGs. VBG to assess for hypercarbia. UA to assess for infection. CT head to rule out ICH. Chest x-ray to assess for pneumonia.  Pertinent labs & imaging results that were available during my care of the patient were  reviewed by me and considered in my medical decision making:  CT head negative for ICH. Chest x-ray with left lower lobe opacity favored to be atelectasis -less likely pneumonia given the lack of fever or leukocytosis. Metabolic panel notable for mild AKI. UA with possible infection.  Repeated CBGs stable.  Patient taking p.o.  Given a dose of Rocephin.  VBG notable for compensated respiratory acidosis. On reassessment patient had increased wheezing, given breathing treatment.  Wheezing improved. She is now verbal and interactive.  Final  Clinical Impression(s) / ED Diagnoses Final diagnoses:  Hypoglycemia  Acute lower UTI  COPD with acute bronchitis (HCC)   The patient appears reasonably screened and/or stabilized for discharge and I doubt any other medical condition or other [facility] requiring further screening, evaluation, or treatment in the ED at this time prior to discharge. Safe for discharge with strict return precautions.  Disposition: Discharge  Condition: Good  I have discussed the results, Dx and Tx plan with the patient/family who expressed understanding and agree(s) with the plan. Discharge instructions discussed at length. The patient/family was given strict return precautions who verbalized understanding of the instructions. No further questions at time of discharge.    ED Discharge Orders          Ordered    cephALEXin (KEFLEX) 500 MG capsule  3 times daily        05/04/21 0631    predniSONE (DELTASONE) 10 MG tablet  Daily        05/04/21 0631    albuterol (VENTOLIN HFA) 108 (90 Base) MCG/ACT inhaler  Every 4 hours PRN        05/04/21 0865             Follow Up: Estevan Oaks, NP [address] [place] [address] [phone]  Call  to schedule an appointment for close follow up     This chart was dictated using voice recognition software.  Despite best efforts to proofread,  errors can occur which can change the documentation meaning.      Nira Conn, MD 05/04/21 [phone]

## 2021-05-12 ENCOUNTER — Emergency Department (HOSPITAL_COMMUNITY): Payer: Medicare (Managed Care)

## 2021-05-12 ENCOUNTER — Other Ambulatory Visit: Payer: Self-pay

## 2021-05-12 ENCOUNTER — Observation Stay (HOSPITAL_COMMUNITY)
Admission: EM | Admit: 2021-05-12 | Discharge: 2021-05-14 | Disposition: A | Discharge status: Home / Self Care | Payer: Medicare (Managed Care) | Attending: Internal Medicine | Admitting: Internal Medicine

## 2021-05-12 ENCOUNTER — Encounter (HOSPITAL_COMMUNITY): Payer: Self-pay

## 2021-05-12 DIAGNOSIS — Z7901 Long term (current) use of anticoagulants: Secondary | ICD-10-CM | POA: Diagnosis not present

## 2021-05-12 DIAGNOSIS — I129 Hypertensive chronic kidney disease with stage 1 through stage 4 chronic kidney disease, or unspecified chronic kidney disease: Secondary | ICD-10-CM | POA: Diagnosis not present

## 2021-05-12 DIAGNOSIS — T68XXXA Hypothermia, initial encounter: Secondary | ICD-10-CM

## 2021-05-12 DIAGNOSIS — U071 COVID-19: Secondary | ICD-10-CM | POA: Insufficient documentation

## 2021-05-12 DIAGNOSIS — N390 Urinary tract infection, site not specified: Secondary | ICD-10-CM | POA: Diagnosis present

## 2021-05-12 DIAGNOSIS — Z79899 Other long term (current) drug therapy: Secondary | ICD-10-CM | POA: Diagnosis not present

## 2021-05-12 DIAGNOSIS — Z794 Long term (current) use of insulin: Secondary | ICD-10-CM | POA: Diagnosis not present

## 2021-05-12 DIAGNOSIS — R404 Transient alteration of awareness: Secondary | ICD-10-CM

## 2021-05-12 DIAGNOSIS — R4182 Altered mental status, unspecified: Secondary | ICD-10-CM | POA: Diagnosis present

## 2021-05-12 DIAGNOSIS — R680 Hypothermia, not associated with low environmental temperature: Secondary | ICD-10-CM

## 2021-05-12 DIAGNOSIS — F0390 Unspecified dementia without behavioral disturbance: Secondary | ICD-10-CM | POA: Insufficient documentation

## 2021-05-12 DIAGNOSIS — N1832 Chronic kidney disease, stage 3b: Secondary | ICD-10-CM | POA: Diagnosis not present

## 2021-05-12 DIAGNOSIS — Y900 Blood alcohol level of less than 20 mg/100 ml: Secondary | ICD-10-CM | POA: Diagnosis not present

## 2021-05-12 DIAGNOSIS — Z7984 Long term (current) use of oral hypoglycemic drugs: Secondary | ICD-10-CM | POA: Insufficient documentation

## 2021-05-12 DIAGNOSIS — Z7722 Contact with and (suspected) exposure to environmental tobacco smoke (acute) (chronic): Secondary | ICD-10-CM | POA: Insufficient documentation

## 2021-05-12 DIAGNOSIS — J449 Chronic obstructive pulmonary disease, unspecified: Secondary | ICD-10-CM | POA: Insufficient documentation

## 2021-05-12 DIAGNOSIS — E162 Hypoglycemia, unspecified: Secondary | ICD-10-CM

## 2021-05-12 DIAGNOSIS — E1122 Type 2 diabetes mellitus with diabetic chronic kidney disease: Secondary | ICD-10-CM | POA: Diagnosis not present

## 2021-05-12 DIAGNOSIS — G9341 Metabolic encephalopathy: Principal | ICD-10-CM | POA: Insufficient documentation

## 2021-05-12 DIAGNOSIS — E11649 Type 2 diabetes mellitus with hypoglycemia without coma: Secondary | ICD-10-CM | POA: Diagnosis not present

## 2021-05-12 LAB — URINALYSIS, ROUTINE W REFLEX MICROSCOPIC
Bilirubin Urine: NEGATIVE
Glucose, UA: NEGATIVE mg/dL
Ketones, ur: NEGATIVE mg/dL
Nitrite: NEGATIVE
Protein, ur: 100 mg/dL — AB
Specific Gravity, Urine: 1.016 (ref 1.005–1.030)
pH: 6 (ref 5.0–8.0)

## 2021-05-12 LAB — DIFFERENTIAL
Abs Immature Granulocytes: 0.05 10*3/uL (ref 0.00–0.07)
Basophils Absolute: 0 10*3/uL (ref 0.0–0.1)
Basophils Relative: 1 %
Eosinophils Absolute: 0.1 10*3/uL (ref 0.0–0.5)
Eosinophils Relative: 2 %
Immature Granulocytes: 1 %
Lymphocytes Relative: 19 %
Lymphs Abs: 1.2 10*3/uL (ref 0.7–4.0)
Monocytes Absolute: 0.6 10*3/uL (ref 0.1–1.0)
Monocytes Relative: 10 %
Neutro Abs: 4.4 10*3/uL (ref 1.7–7.7)
Neutrophils Relative %: 67 %

## 2021-05-12 LAB — COMPREHENSIVE METABOLIC PANEL
ALT: 51 U/L — ABNORMAL HIGH (ref 0–44)
AST: 38 U/L (ref 15–41)
Albumin: 2.6 g/dL — ABNORMAL LOW (ref 3.5–5.0)
Alkaline Phosphatase: 49 U/L (ref 38–126)
Anion gap: 5 (ref 5–15)
BUN: 31 mg/dL — ABNORMAL HIGH (ref 8–23)
CO2: 27 mmol/L (ref 22–32)
Calcium: 8.3 mg/dL — ABNORMAL LOW (ref 8.9–10.3)
Chloride: 109 mmol/L (ref 98–111)
Creatinine, Ser: 1.61 mg/dL — ABNORMAL HIGH (ref 0.44–1.00)
GFR, Estimated: 33 mL/min — ABNORMAL LOW (ref >60)
Glucose, Bld: 76 mg/dL (ref 70–99)
Potassium: 4.1 mmol/L (ref 3.5–5.1)
Sodium: 141 mmol/L (ref 135–145)
Total Bilirubin: 0.2 mg/dL — ABNORMAL LOW (ref 0.3–1.2)
Total Protein: 5.4 g/dL — ABNORMAL LOW (ref 6.5–8.1)

## 2021-05-12 LAB — I-STAT CHEM 8, ED
BUN: 30 mg/dL — ABNORMAL HIGH (ref 8–23)
Calcium, Ion: 1.16 mmol/L (ref 1.15–1.40)
Chloride: 106 mmol/L (ref 98–111)
Creatinine, Ser: 1.5 mg/dL — ABNORMAL HIGH (ref 0.44–1.00)
Glucose, Bld: 72 mg/dL (ref 70–99)
HCT: 27 % — ABNORMAL LOW (ref 36.0–46.0)
Hemoglobin: 9.2 g/dL — ABNORMAL LOW (ref 12.0–15.0)
Potassium: 4.1 mmol/L (ref 3.5–5.1)
Sodium: 143 mmol/L (ref 135–145)
TCO2: 28 mmol/L (ref 22–32)

## 2021-05-12 LAB — CBG MONITORING, ED
Glucose-Capillary: 72 mg/dL (ref 70–99)
Glucose-Capillary: 133 mg/dL — ABNORMAL HIGH (ref 70–99)
Glucose-Capillary: 53 mg/dL — ABNORMAL LOW (ref 70–99)
Glucose-Capillary: 142 mg/dL — ABNORMAL HIGH (ref 70–99)

## 2021-05-12 LAB — CBC
HCT: 31.1 % — ABNORMAL LOW (ref 36.0–46.0)
Hemoglobin: 9.9 g/dL — ABNORMAL LOW (ref 12.0–15.0)
MCH: 29.6 pg (ref 26.0–34.0)
MCHC: 31.8 g/dL (ref 30.0–36.0)
MCV: 93.1 fL (ref 80.0–100.0)
Platelets: 171 10*3/uL (ref 150–400)
RBC: 3.34 MIL/uL — ABNORMAL LOW (ref 3.87–5.11)
RDW: 14.7 % (ref 11.5–15.5)
WBC: 6.4 10*3/uL (ref 4.0–10.5)
nRBC: 0 % (ref 0.0–0.2)

## 2021-05-12 LAB — RAPID URINE DRUG SCREEN, HOSP PERFORMED
Amphetamines: NOT DETECTED
Barbiturates: NOT DETECTED
Benzodiazepines: NOT DETECTED
Cocaine: NOT DETECTED
Opiates: NOT DETECTED
Tetrahydrocannabinol: NOT DETECTED

## 2021-05-12 LAB — PROTIME-INR
INR: 1.6 — ABNORMAL HIGH (ref 0.8–1.2)
Prothrombin Time: 19.4 seconds — ABNORMAL HIGH (ref 11.4–15.2)

## 2021-05-12 LAB — RESP PANEL BY RT-PCR (FLU A&B, COVID) ARPGX2
Influenza A by PCR: NEGATIVE
Influenza B by PCR: NEGATIVE
SARS Coronavirus 2 by RT PCR: POSITIVE — AB

## 2021-05-12 LAB — APTT: aPTT: 33 seconds (ref 24–36)

## 2021-05-12 LAB — ETHANOL: Alcohol, Ethyl (B): <10 mg/dL (ref <10)

## 2021-05-12 MED ORDER — ACETAMINOPHEN 325 MG PO TABS: 650.0000 mg | ORAL_TABLET | Freq: Four times a day (QID) | ORAL | Status: DC | PRN

## 2021-05-12 MED ORDER — DEXTROSE IN LACTATED RINGERS 5 % IV SOLN: INTRAVENOUS | Status: AC

## 2021-05-12 MED ORDER — LACTATED RINGERS IV SOLN: INTRAVENOUS | Status: DC

## 2021-05-12 MED ORDER — DEXTROSE 50 % IV SOLN
1.0000 | INTRAVENOUS | Status: DC | PRN
  Administered 2021-05-12: 50 mL via INTRAVENOUS
  Filled 2021-05-12: qty 50

## 2021-05-12 MED ORDER — DEXTROSE-NACL 5-0.9 % IV SOLN: INTRAVENOUS | Status: DC

## 2021-05-12 MED ORDER — DEXTROSE 10 % IV SOLN: INTRAVENOUS | Status: DC

## 2021-05-12 MED ORDER — ACETAMINOPHEN 650 MG RE SUPP: 650.0000 mg | Freq: Four times a day (QID) | RECTAL | Status: DC | PRN

## 2021-05-12 NOTE — ED Notes (Signed)
Called 3e for report nurse asked this RN to call back in 15 min, will make second call at 2330

## 2021-05-12 NOTE — H&P (Addendum)
Date: 05/12/2021               Patient Name:  Gabriella [name] MRN: [id number]  DOB: 1940-12-18 Age / Sex: 80 y.o., female   PCP: Estevan Oaks, NP (Inactive)         Medical Service: Internal Medicine Teaching Service         Attending Physician: Dr. Milagros Loll, MD    First Contact: Dr. Welton Flakes Pager: [phone]  Second Contact: Dr. Ephriam Knuckles Pager: [phone]       After Hours (After 5p/  First Contact Pager: [phone]  weekends / holidays): Second Contact Pager: [phone]   Chief Complaint: AMS  History of Present Illness:   Gabriella [name] is a chronically ill 80 year old female with diabetes, COPD with chronic respiratory failure (3L), prior strokes, and dementia who resides at a skilled facility. Evaluated in the ED a week ago for altered mental status and was found to be hypoglycemic (CBG 37). She received a D10 bolus with repeat CBG of 140. UA questionable for UTI so received a 7 day course of antibiotics with keflex. Mental status improved and she was discharged back to her skilled facility.  Encephalopathy precluding history taking from the patient today.  She returned to the ED today via EMS as a code stroke after being found unresponsive at her SNF. CBG was 22 at that time and she received IV dextrose. CBG on arrival was noted to be in the 70s and she was received an amp of D50. CTH negative for acute bleed or ischemia. Mental status initially improved after receiving glucose therapy so stroke code was canceled. Shortly after returning from CT, she was noted to become more lethargic again and she was noted to be hypothermic at 94.73F.  Diabetes history 03/2020: A1C 10.3 08/2020: lantus 28U daily, metformin 1000mg  BID, trulicity 0.75mg  weekly 03/2021: lantus 60U daily; humalog SSI 05/04/21: semglee 24U daily, metformin 1000mg  BID, trulicity 0.75mg  daily 05/12/21: Lantus 45U daily, metformin, and trulicity 0.75mg  weekly injections.    Medications Albuterol sulfate 108 (90  base) mcg/ACT 2 puffs every 6 hours as needed for wheezing Amlodipine 5 mg daily Breo Ellipta 200-25 mcg per inhale inhalation 1 puff daily Clonidine 0.2 mg 2 times per day Crestor 10 mg daily Eliquis 5 mg twice daily Humalog sliding scale Lantus 45 units daily Low Bauza 1 g take 2 capsules by mouth 2 times daily Metoprolol succinate ER 100 mg daily Zofran 4 mg every 6 hours as needed   Allergies: Allergies as of 05/12/2021 - Review Complete 05/12/2021  Allergen Reaction Noted   Other Diarrhea, Nausea And Vomiting, Anxiety, and Other (See Comments) 10/07/2009   Past Medical History:  Diagnosis Date   Arthritis    COPD (chronic obstructive pulmonary disease) (HCC)    Diabetes mellitus without complication (HCC)    Hypertension     History reviewed. No pertinent family history.  Social History: Resides at 05/14/2021 skilled nursing facility.  Review of Systems: Unable to be obtained due to acute encephalopathy  Physical Exam: Blood pressure (!) 124/59, pulse (!) 47, temperature (!) 94.7 F (34.8 C), temperature source Rectal, resp. rate 15, height 5\' 3"  (1.6 m), weight 105 kg, SpO2 98 %.  General: Chronically ill, obese female in no acute distress HENT: Dry mucous membranes, head atraumatic Eyes: No scleral icterus or conjunctival injection Cardiac: Bradycardic rate, regular rhythm, trace pitting lower extremity edema, Pulm: Breathing comfortably on room air.  Lungs clear GI: Abdomen  soft, nontender, no suprapubic tenderness Skin: No rash or lesion on limited exam.  Scattered purpura on extremities MSK: Normal muscle tone.  No rigidity Neurologic exam Mental status: Lethargic, awakens to voice.  Oriented to person but not to place or situation--thinks she is in a grocery store.  Able to perform simple arithmetic.  Follows commands consistently. Speech: Slow speech, no dysarthria Cranial nerves: Pupils are miotic, reactive to light equally.  EOMs intact.  Facial  sensation intact.   Symmetric smile, right ptosis.  Tongue midline Motor: No tremors.  5 out of 5 strength in the bilateral upper and lower extremities. Sensation: Intact throughout  EKG: sinus bradycardia  CTH: no acute infarct, ASPECTS 10  Assessment & Plan by Problem: Active Problems:   * No active hospital problems. *  Acute metabolic encephalopathy secondary to hypoglycemia vs post-COVID-19 vs UTI vs dehydration vs steroid induced vs polypharmacy No focal neurological changes on admission so I think we forgo MRI at this time. Based on chart review from care everywhere and our records, it appears that she has been receiving steroids since her admission in July at [facility]. Clinical appearance is incongruent with typical presentation of steroid induced psychosis. 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 Question if her long acting insulin dose had been adjusted higher due to steroid use, but perhaps not decreased, resulting in hypoglycemia.  Hypoglycemia persists despite D5 infusion. Subacute kidney injury vs progression to CKD Renal function has decreased significantly since 2021. She was noted to have renal failure during her admission at [facility] in July but difficult to follow after than. Plan Admit to observation status. Suspect her mental status will continue to improve with resolution of hypoglycemia Q2h CBGs Hold home antihyperglycemics D10 gtt NPO until mental status improves UA/UC AM cortisol Delirium precautions  Hypertension. Chronic and stable. Monitor for rebound hypertension if having to continue to hold clonidine. Resume home medications when mental status permits PO  Bradycardia Difficult to assess symptomatic vs asymptomatic. On  100mg  metoprolol at home. 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  On room air, no evidence of exacerbation on admission -Continue to monitor  Hx of COVID 19 pneumonia (July 2022), COVID test positive on admission. Suspect secondary to prior infection. No respiratory symptoms.  -no airborne precautions unless other symptoms develop  Dispo: Admit patient to Observation with expected length of stay less than 2 midnights. Pt to return to [facility] at time of discharge.  Signed: 03-13-2001, MD Internal Medicine Resident PGY-3 [facility] Pager: [phone] 05/12/2021 8:18 PM

## 2021-05-12 NOTE — Consult Note (Addendum)
Neurology Consultation  Reason for Consult: AMS, unresponsive Referring Physician: Dr. Stevie Kernykstra  CC: AMS, unresponsive  History is obtained from: Chart review, EMS, unable to obtain from patient due to mental status  HPI: Rubbie BattiestMartha First is a 80 y.o. female with a medical history significant for essential hypertension, type 2 diabetes mellitus, TIA, dementia, COPD on 3L [place], obesity, peripheral arterial disease, on chronic anticoagulation with Eliquis for remote history of popliteal and tibial artery emboli with left lower extremity s/p embolectomy, and arthritis who presented to the ED 8/23 from her health and rehab facility for evaluation of acute unresponsiveness.  Per facility, patient was last seen normal at 11: 30 this morning sitting up, talking, and interacting with staff.  At 12: 15 staff found patient to be unresponsive with a blood glucose of 22. She was given dextrose by EMS en route to the hospital with improvement in blood glucose initially to 185. EMS also witnessed episode of transient right gaze with right facial droop that resolved prior to hospital arrival. At the bridge on hospital arrival, her finger stick blood glucose was 77. She was given an amp of dextrose prior to CT head imaging.   Of note Ms. Reino KentHood was evaluated in the ED approximately one week ago on 05/04/2021 for altered mental status.  The patient was reportedly yelling out at facility and when they checked her glucose was 37.  She was also found at this time to have a urinary tract infection, mild acute kidney injury, and negative CT head imaging.  On resolution of hypoglycemia she became verbal and interactive with staff and was discharged on cephalexin for treatment of possible UTI.  LKW: 11:30AM tpa given?: no, presentation is most consistent with sequelae of severe hypoglycemia with initial blood glucose of 22 and improvement in mental status following dextrose administration. Also, patient on chronic full anticoagulation  with Eliquis.  IR Thrombectomy? No, presentation is not consistent with LVO with improvement in mental status following dextrose demonstration and resolution of hypoglycemia Modified Rankin Scale: 4-Needs assistance to walk and tend to bodily needs  ROS: Unable to obtain due to altered mental status.   Past Medical History:  Diagnosis Date   Arthritis    COPD (chronic obstructive pulmonary disease) (HCC)    Diabetes mellitus without complication (HCC)    Hypertension    Past Surgical History:  Procedure Laterality Date   CATARACT EXTRACTION Right 11/2017   EMBOLECTOMY Left 11/05/2018   Procedure: EMBOLECTOMY LEFT POPLITEAL ARTERY;  Surgeon: Sherren KernsFields, Charles E, MD;  [facility];  Service: Vascular;  Laterality: Left;   FASCIOTOMY Left 11/05/2018   Procedure: Four compartment Fasciotomy left lower leg;  Surgeon: Sherren KernsFields, Charles E, MD;  [facility];  Service: Vascular;  Laterality: Left;   I & D EXTREMITY Left 11/20/2018   Procedure: IRRIGATION AND DEBRIDEMENT LEFT LOWER EXTREMITY;  Surgeon: Sherren KernsFields, Charles E, MD;  [facility];  Service: Vascular;  Laterality: Left;   PATCH ANGIOPLASTY Left 11/05/2018   Procedure: Patch Angioplasty LEFT POPLITEAL ARTERY;  Surgeon: Sherren KernsFields, Charles E, MD;  [facility];  Service: Vascular;  Laterality: Left;   No family history on file.  Social History:   reports that she is a non-smoker but has been exposed to tobacco smoke. She has never used smokeless tobacco. She reports that she does not drink alcohol and does not use drugs.  Medications  Current Facility-Administered Medications:    dextrose 5 % in lactated ringers infusion, , Intravenous, Continuous, Demaio, Alexa,  MD  Current Outpatient Medications:    acetaminophen (TYLENOL) 500 MG tablet, Take 500 mg by mouth 2 (two) times daily as needed for moderate pain., Disp: , Rfl:    acetaminophen (TYLENOL) 650 MG CR tablet, Take 650 mg by mouth in the morning, at noon, and at bedtime. ,  Disp: , Rfl:    albuterol (VENTOLIN HFA) 108 (90 Base) MCG/ACT inhaler, Inhale 2-4 puffs into the lungs every 4 (four) hours as needed for up to 15 days for wheezing or shortness of breath., Disp: 1 each, Rfl: 0   Alpha-Lipoic Acid 600 MG CAPS, Take 600 mg by mouth in the morning, at noon, and at bedtime., Disp: , Rfl:    apixaban (ELIQUIS) 5 MG TABS tablet, Take 5 mg by mouth 2 (two) times daily., Disp: , Rfl:    Artificial Saliva (BIOTENE DRY MOUTH MOISTURIZING) SOLN, Take 1 spray by mouth See admin instructions. Six times daily, Disp: , Rfl:    Capsaicin-Menthol (SALONPAS PAIN REL GEL-PTCH HOT) 0.025-1.25 % PTCH, Apply 1 patch topically in the morning, at noon, and at bedtime., Disp: , Rfl:    chlorhexidine (PERIDEX) 0.12 % solution, Use as directed 5 mLs in the mouth or throat See admin instructions. Brush on 1 tsp. of solution to teeth and gums with a toothbrush after PM mouth care. Spit out excess and do not rinse., Disp: , Rfl:    Cholecalciferol (VITAMIN D3) 1.25 MG (50000 UT) CAPS, Take 50,000 Units by mouth every 30 (thirty) days. , Disp: , Rfl:    diclofenac Sodium (VOLTAREN) 1 % GEL, Apply 2 g topically 4 (four) times daily., Disp: , Rfl:    DULoxetine (CYMBALTA) 60 MG capsule, Take 60 mg by mouth daily., Disp: , Rfl:    Fluticasone-Salmeterol (ADVAIR DISKUS) 250-50 MCG/DOSE AEPB, Inhale 1 puff into the lungs 2 (two) times daily., Disp: , Rfl:    HM LIDOCAINE PATCH EX, Apply 1 patch topically daily. Leave on 12 hours then off 12 hours, Disp: , Rfl:    hydroxypropyl methylcellulose / hypromellose (ISOPTO TEARS / GONIOVISC) 2.5 % ophthalmic solution, Place 1 drop into both eyes in the morning, at noon, in the evening, and at bedtime., Disp: , Rfl:    insulin glargine (SEMGLEE) 100 UNIT/ML injection, Inject 24 Units into the skin daily., Disp: , Rfl:    ipratropium-albuterol (DUONEB) 0.5-2.5 (3) MG/3ML SOLN, Take 3 mLs by nebulization in the morning and at bedtime. Can take midday and evening  as needed for wheezing and shortness of breath., Disp: , Rfl:    lisinopril (ZESTRIL) 10 MG tablet, Take 10 mg by mouth daily., Disp: , Rfl:    Menthol, Topical Analgesic, (BIOFREEZE EX), Apply 1 application topically in the morning and at bedtime. , Disp: , Rfl:    metFORMIN (GLUCOPHAGE) 1000 MG tablet, Take 1,000 mg by mouth 2 (two) times daily with a meal., Disp: , Rfl:    metoprolol succinate (TOPROL-XL) 25 MG 24 hr tablet, Take 25 mg by mouth daily., Disp: , Rfl:    mineral oil-hydrophilic petrolatum (AQUAPHOR) ointment, Apply 1 application topically daily. And after bathing., Disp: , Rfl:    nystatin (NYSTATIN) powder, Apply 1 application topically 3 (three) times daily., Disp: , Rfl:    Omega-3 1000 MG CAPS, Take 2,000 mg by mouth in the morning and at bedtime., Disp: , Rfl:    rOPINIRole (REQUIP) 1 MG tablet, Take 1 mg by mouth at bedtime., Disp: , Rfl:    rosuvastatin (CRESTOR) 20 MG  tablet, Take 20 mg by mouth at bedtime., Disp: , Rfl:    traMADol (ULTRAM) 50 MG tablet, Take 1 tablet (50 mg total) by mouth every 6 (six) hours as needed for moderate pain., Disp: 30 tablet, Rfl: 0   traZODone (DESYREL) 150 MG tablet, Take 150 mg by mouth at bedtime., Disp: , Rfl:    TRULICITY 0.75 MG/0.5ML SOPN, Inject 0.75 mg into the skin once a week., Disp: , Rfl:    zinc oxide (BALMEX) 11.3 % CREA cream, Apply 1 application topically 2 (two) times daily. To buttocks. Also as needed after toileting, Disp: , Rfl:   Exam: Current vital signs: BP 137/66 (BP Location: Left Arm)   Pulse (!) 58   Temp (!) 94.7 F (34.8 C) (Rectal)   Resp 18   Wt 105 kg   SpO2 96%   BMI 41.01 kg/m  Vital signs in last 24 hours: Temp:  [94.7 F (34.8 C)] 94.7 F (34.8 C) (08/23 1342) Pulse Rate:  [58] 58 (08/23 1342) Resp:  [18] 18 (08/23 1342) BP: (137)/(66) 137/66 (08/23 1342) SpO2:  [96 %] 96 % (08/23 1342) Weight:  [105 kg] 105 kg (08/23 1322)  GENERAL: Somnolent, critically ill appearing female initially  on EMS stretcher Psych: Unable to assess due to patient's mental status  Head: Normocephalic and atraumatic, without obvious abnormality EENT: Normal conjunctivae, dry mucous membranes, no OP obstruction LUNGS: Normal respiratory effort. Non-labored breathing on [place] CV: Irregular rate, bradycardia, regular rhythm on cardiac monitor ABDOMEN: Soft, non-tender, non-distended Extremities: warm, well perfused, without obvious deformity  NEURO: Initial assessment at bridge at 13:15, reassessment in patient room at 13:35 Mental Status: Stuporous, initially only arouses briefly with repeated application of noxious stimuli.  She is able to provide a clear and coherent history of present illness. Speech/Language: speech is mute initially and she does not follow commands.  Unable to assess orientation.   After dextrose administration and CT head imaging, patient opens her eyes and is able to follow commands. She does not verbalize outside of "do that again and I will smack you" with nailbed pressure.  She shows fingers for the age "4" when asked her age but does not answer further questions.  Once back in her room, she remains limited in her verbalization and nods "yes" when asked if she is having trouble speaking but she is able to state "my arm hurts" when being turned for cleaning and tells the EDP that she would like soda. She does not attempt to name objects or repeat phrases.  Cranial Nerves:  II: PERRL 2 mm/brisk. Visual fields full.  III, IV, VI: Initially does not fixate or track, on reassessment EOMI without ptosis V: Initially UTA but on reassessment, sensation is intact to light touch and symmetrical to face.  VII: Initially there is a slight left facial droop with grimace. On reassessment, she does not smile to command but her eyebrows elevate symmetrically. Face appears symmetric resting.  VIII: Initially unable to assess as she does not follow commands or open eyes to voice, on  reassessment hearing is intact to voice IX, X: Initially mute, on reassessment, phonation normal. Patient does not allow for visualization of palate elevation. XI: Head appears grossly midline XII: Does not protrude tongue to command Motor: Initially, patient localizes briskly in bilateral upper extremities with application of noxious stimuli and withdraws bilateral lower extremities minimally with application of noxious stimuli. Right lower extremity withdraws more briskly than the left.  On reassessment, she wiggles toes  bilaterally and is able to lift bilateral lower extremities off of bed briefly.  Bilateral upper extremities are 4+/5 with antigravity movement but she does not sustain antigravity movement to command.  Tone is normal. Bulk is normal.  Sensation: Initially unable to assess, on reassessment sensation is intact to light touch bilaterally in all four extremities.  Coordination: Unable to assess, patient does not follow complex commands DTRs: 2+ and symmetric biceps and brachioradialis Gait: Deferred for patient safety  NIHSS: 1a Level of Conscious.: 2 1b LOC Questions: 2 1c LOC Commands: 2 2 Best Gaze: 0 3 Visual: 0 4 Facial Palsy: 1 5a Motor Arm - left: 2 5b Motor Arm - Right: 2 6a Motor Leg - Left: 3 6b Motor Leg - Right: 3 7 Limb Ataxia: 0 8 Sensory: 0 9 Best Language: 3 10 Dysarthria: 2 11 Extinct. and Inatten.: 0 TOTAL: 23 on initial assessment at the bridge  Labs I have reviewed labs in epic and the results pertinent to this consultation are: CBC    Component Value Date/Time   WBC 6.4 05/12/2021 1321   RBC 3.34 (L) 05/12/2021 1321   HGB 9.9 (L) 05/12/2021 1321   HCT 31.1 (L) 05/12/2021 1321   PLT 171 05/12/2021 1321   MCV 93.1 05/12/2021 1321   MCH 29.6 05/12/2021 1321   MCHC 31.8 05/12/2021 1321   RDW 14.7 05/12/2021 1321   LYMPHSABS 1.2 05/12/2021 1321   MONOABS 0.6 05/12/2021 1321   EOSABS 0.1 05/12/2021 1321   BASOSABS 0.0 05/12/2021 1321    CMP     Component Value Date/Time   NA 143 05/12/2021 1320   K 4.1 05/12/2021 1320   CL 106 05/12/2021 1320   CO2 27 05/04/2021 0227   GLUCOSE 72 05/12/2021 1320   BUN 30 (H) 05/12/2021 1320   CREATININE 1.50 (H) 05/12/2021 1320   CALCIUM 9.1 05/04/2021 0227   PROT 6.1 (L) 05/04/2021 0227   ALBUMIN 2.7 (L) 05/04/2021 0227   AST 22 05/04/2021 0227   ALT 28 05/04/2021 0227   ALKPHOS 56 05/04/2021 0227   BILITOT 0.5 05/04/2021 0227   GFRNONAA 31 (L) 05/04/2021 0227   GFRAA >60 04/12/2020 0223   Lipid Panel  No results found for: CHOL, TRIG, HDL, CHOLHDL, VLDL, LDLCALC, LDLDIRECT  Imaging I have reviewed the images obtained:  CT-scan of the brain 05/12/2021: 1. No evidence acute large vascular territory infarct or acute hemorrhage. ASPECTS is 10. 2. Moderate chronic microvascular ischemic and atrophy. 3. Mild-to-moderate paranasal sinus disease with air-fluid levels and frothy secretions in bilateral sphenoid sinuses. 4. Large right and small left mastoid effusions.   Assessment: 80 y.o. female who presented to the ED for the second time in 8 days for evaluation of altered mental status in the setting of severe hypoglycemia. On 05/04/2021, patient was yelling out but not following commands and was not answering examiner questions with improvement with resolution of hypoglycemia. Today, she was noted by facility staff to be unresponsive at 12:15 with a blood glucose of 22. She had initially improved with dextrose to a blood glucose of 185 but on arrival a finger-stick blood glucose was obtained with a reading of 77. She was given one amp of dextrose prior to CT imaging with improvement in mental status. EMS also noted a transient episode of right gaze and right facial droop en route that resolved PTA. - Examination reveals patient that was initially responsive only to noxious stimuli via a sternal rub but who rapidly improved with dextrose administration  with adequate blood glucose  response.  - CTH was obtained and reviewed by neurology attending without evidence of acute large vascular territory infarct or acute hemorrhage. With rapid improvement in symptoms with resolution of hypoglycemia- moving extremities equally, no visual deficits, patient answering some examiner questions and interacting with staff, presentation was felt to be most consistent with sequelae of hypoglycemia and the Code Stroke was cancelled.  - Recent similar presentation 05/04/2021 with altered mental status, not following commands, and initially nonverbal that resolved with improvement in blood glucose.  Please note transient focal neurological deficits can be seen in the setting of acute hypoglycemia, and there is also concern for potential postictal state due to provoked seizure in the setting of hypoglycemia, which would not need to be treated with antiseizure medications but rather with improved glucose control.  Impression: Symptomatic hypoglycemia Altered mental status secondary to severe hypoglycemia  Recommendations: - Code Stroke cancelled in the setting of severe symptomatic hypoglycemia. Improvement in mental status rapidly with improvement in blood glucose. - Management of hypoglycemia per EDP/primary team - MRI brain without contrast can be considered only if patient does not return to baseline -- please notify neurology with any abnormalities - Given that this is the patient's second episode of severely symptomatic hypoglycemia in the last month, suggest careful attention to her diet and home insulin regimen to avoid future presentations as this can be a severely disabling and even deadly event - No further neurology work up indicated at this time, plan discussed with Dr. Stevie Kern. Neurology will be available as needed for further questions or concerns.  Please do notify us if the patient has new neurological concerns that develop  Lanae Boast, AGAC-NP [facility] Pager:  [phone]  Attending Neurologist's note:  I personally saw this patient, gathering history, performing a neurologic examination, reviewing relevant labs, personally reviewing relevant imaging including head CT from today as well as approximately 2 weeks ago, and formulated the assessment and plan, adding the note above for completeness and clarity to accurately reflect my thoughts  Brooke Dare MD-PhD [facility] [phone] Available 7 AM to 7 PM, outside these hours please contact Neurologist on call listed on AMION

## 2021-05-12 NOTE — ED Provider Notes (Signed)
[facility] EMERGENCY DEPARTMENT Provider Note   CSN: [id number] Arrival date & time: 05/12/21  1311     History Chief Complaint  Patient presents with   Code Stroke    Viktoriya Glaspy is a 80 y.o. female.  HPI Patient brought in by EMS after being found unresponsive at facility. Last known normal was about an hour prior. Patient nonverbal and not following commands which is a change from baseline. Code stroke activated.     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)    Peripheral vascular disease (HCC) 11/05/2018   AKI (acute kidney injury) (HCC)    Diarrhea    Hyperglycemia    Gastroenteritis 11/02/2018   Pressure injury of skin 12/04/2017   Diabetic ketoacidosis (HCC) 12/03/2017   Acute respiratory failure with hypoxia (HCC) 12/02/2017   Influenza B 12/02/2017    Past Surgical History:  Procedure Laterality Date   CATARACT EXTRACTION Right 11/2017   EMBOLECTOMY Left 11/05/2018   Procedure: EMBOLECTOMY LEFT POPLITEAL ARTERY;  Surgeon: Sherren Kerns, MD;  Location[facility] OR;  Service: Vascular;  Laterality: Left;   FASCIOTOMY Left 11/05/2018   Procedure: Four compartment Fasciotomy left lower leg;  Surgeon: Sherren Kerns, MD;  Location: [facility] OR;  Service: Vascular;  Laterality: Left;   I & D EXTREMITY Left 11/20/2018   Procedure: IRRIGATION AND DEBRIDEMENT LEFT LOWER EXTREMITY;  Surgeon: Sherren Kerns, MD;  Location: [facility] OR;  Service: Vascular;  Laterality: Left;   PATCH ANGIOPLASTY Left 11/05/2018   Procedure: Patch Angioplasty LEFT POPLITEAL ARTERY;  Surgeon: Sherren Kerns, MD;  Location: [facility] OR;  Service:  Vascular;  Laterality: Left;     OB History   No obstetric history on file.     History reviewed. No pertinent family history.  Social History   Tobacco Use   Smoking status: Passive Smoke Exposure - Never Smoker   Smokeless tobacco: Never  Vaping Use   Vaping Use: Never used  Substance Use Topics   Alcohol use: No   Drug use: No    Home Medications Prior to Admission medications   Medication Sig Start Date End Date Taking? Authorizing Provider  acetaminophen (TYLENOL) 500 MG tablet Take 500 mg by mouth 2 (two) times daily as needed for moderate pain.    [provider]  acetaminophen (TYLENOL) 650 MG CR tablet Take 650 mg by mouth in the morning, at noon, and at bedtime.     [provider]  albuterol (VENTOLIN HFA) 108 (90 Base) MCG/ACT inhaler Inhale 2-4 puffs into the lungs every 4 (four) hours as needed for up to 15 days for wheezing or shortness of breath. 05/04/21 05/19/21  Nira Conn, MD  Alpha-Lipoic Acid 600 MG CAPS Take 600 mg by mouth in the morning, at noon, and at bedtime.    [provider]  apixaban (ELIQUIS) 5 MG TABS tablet Take 5 mg by mouth 2 (two) times daily.    [provider]  Artificial Saliva (BIOTENE DRY MOUTH MOISTURIZING) SOLN Take 1 spray by mouth See admin instructions. Six times daily    [provider]  Capsaicin-Menthol (  SALONPAS PAIN REL GEL-PTCH HOT) 0.025-1.25 % PTCH Apply 1 patch topically in the morning, at noon, and at bedtime.    [provider]  chlorhexidine (PERIDEX) 0.12 % solution Use as directed 5 mLs in the mouth or throat See admin instructions. Brush on 1 tsp. of solution to teeth and gums with a toothbrush after PM mouth care. Spit out excess and do not rinse.    [provider]  Cholecalciferol (VITAMIN D3) 1.25 MG (50000 UT) CAPS Take 50,000 Units by mouth every 30 (thirty) days.     [provider]  diclofenac Sodium (VOLTAREN) 1 % GEL Apply 2 g  topically 4 (four) times daily.    [provider]  DULoxetine (CYMBALTA) 60 MG capsule Take 60 mg by mouth daily. 12/02/16   [provider]  Fluticasone-Salmeterol (ADVAIR DISKUS) 250-50 MCG/DOSE AEPB Inhale 1 puff into the lungs 2 (two) times daily.    [provider]  HM LIDOCAINE PATCH EX Apply 1 patch topically daily. Leave on 12 hours then off 12 hours    [provider]  hydroxypropyl methylcellulose / hypromellose (ISOPTO TEARS / GONIOVISC) 2.5 % ophthalmic solution Place 1 drop into both eyes in the morning, at noon, in the evening, and at bedtime.    [provider]  insulin glargine (SEMGLEE) 100 UNIT/ML injection Inject 24 Units into the skin daily.    [provider]  ipratropium-albuterol (DUONEB) 0.5-2.5 (3) MG/3ML SOLN Take 3 mLs by nebulization in the morning and at bedtime. Can take midday and evening as needed for wheezing and shortness of breath.    [provider]  lisinopril (ZESTRIL) 10 MG tablet Take 10 mg by mouth daily.    [provider]  Menthol, Topical Analgesic, (BIOFREEZE EX) Apply 1 application topically in the morning and at bedtime.     [provider]  metFORMIN (GLUCOPHAGE) 1000 MG tablet Take 1,000 mg by mouth 2 (two) times daily with a meal.    [provider]  metoprolol succinate (TOPROL-XL) 25 MG 24 hr tablet Take 25 mg by mouth daily.    [provider]  mineral oil-hydrophilic petrolatum (AQUAPHOR) ointment Apply 1 application topically daily. And after bathing.    [provider]  nystatin (NYSTATIN) powder Apply 1 application topically 3 (three) times daily.    [provider]  Omega-3 1000 MG CAPS Take 2,000 mg by mouth in the morning and at bedtime.    [provider]  rOPINIRole (REQUIP) 1 MG tablet Take 1 mg by mouth at bedtime.    [provider]  rosuvastatin (CRESTOR) 20 MG tablet Take 20 mg by mouth at bedtime.     [provider]  traMADol (ULTRAM) 50 MG tablet Take 1 tablet (50 mg total) by mouth every 6 (six) hours as needed for moderate pain. 04/17/20   Persons, [place], PA  traZODone (DESYREL) 150 MG tablet Take 150 mg by mouth at bedtime.    [provider]  TRULICITY 0.75 MG/0.5ML SOPN Inject 0.75 mg into the skin once a week. 03/14/20   [provider]  zinc oxide (BALMEX) 11.3 % CREA cream Apply 1 application topically 2 (two) times daily. To buttocks. Also as needed after toileting    [provider]    Allergies    Other  Review of Systems   Review of Systems  Unable to perform ROS: Patient unresponsive   Physical Exam Updated Vital Signs BP 137/66 (BP Location: Left Arm)  Pulse (!) 58   Temp (!) 94.7 F (34.8 C) (Rectal)   Resp 18   Ht 5\' 3"  (1.6 m)   Wt 105 kg   SpO2 97%   BMI 41.01 kg/m   Physical Exam Constitutional:      Appearance: She is ill-appearing.  HENT:     Head: Normocephalic and atraumatic.  Eyes:     Extraocular Movements: Extraocular movements intact.  Cardiovascular:     Rate and Rhythm: Normal rate and regular rhythm.  Pulmonary:     Effort: No respiratory distress.     Breath sounds: Rhonchi present.  Abdominal:     General: There is distension.     Tenderness: There is no abdominal tenderness.  Neurological:     General: No focal deficit present.     Mental Status: She is lethargic and disoriented.     Sensory: Sensation is intact.     Comments: Patient intermittently non-verbal but appears to largely be volitionally limited.    ED Results / Procedures / Treatments   Labs (all labs ordered are listed, but only abnormal results are displayed) Labs Reviewed  PROTIME-INR - Abnormal; Notable for the following components:      Result Value   Prothrombin Time 19.4 (*)    INR 1.6 (*)    All other components within normal limits  CBC - Abnormal; Notable for the following components:   RBC 3.34 (*)     Hemoglobin 9.9 (*)    HCT 31.1 (*)    All other components within normal limits  COMPREHENSIVE METABOLIC PANEL - Abnormal; Notable for the following components:   BUN 31 (*)    Creatinine, Ser 1.61 (*)    Calcium 8.3 (*)    Total Protein 5.4 (*)    Albumin 2.6 (*)    ALT 51 (*)    Total Bilirubin 0.2 (*)    GFR, Estimated 33 (*)    All other components within normal limits  I-STAT CHEM 8, ED - Abnormal; Notable for the following components:   BUN 30 (*)    Creatinine, Ser 1.50 (*)    Hemoglobin 9.2 (*)    HCT 27.0 (*)    All other components within normal limits  CBG MONITORING, ED - Abnormal; Notable for the following components:   Glucose-Capillary 133 (*)    All other components within normal limits  RESP PANEL BY RT-PCR (FLU A&B, COVID) ARPGX2  ETHANOL  APTT  DIFFERENTIAL  RAPID URINE DRUG SCREEN, HOSP PERFORMED  URINALYSIS, ROUTINE W REFLEX MICROSCOPIC  CBG MONITORING, ED    EKG None  Radiology CT HEAD CODE STROKE WO CONTRAST  Result Date: 05/12/2021 CLINICAL DATA:  Code stroke. Neuro deficit, acute, stroke suspected. Weakness. EXAM: CT HEAD WITHOUT CONTRAST TECHNIQUE: Contiguous axial images were obtained from the base of the skull through the vertex without intravenous contrast. COMPARISON:  CT head 05/04/2021. FINDINGS: Brain: No evidence of acute infarction, hemorrhage, hydrocephalus, extra-axial collection or mass lesion/mass effect. Similar moderate patchy and confluent white matter hypoattenuation, nonspecific most likely related to chronic microvascular ischemic disease. Moderate atrophy with ex vacuo ventricular dilation. Vascular: No hyperdense vessel identified. Calcific intracranial atherosclerosis Skull: No acute fracture.  Hyperostosis frontalis. Sinuses/Orbits: Moderate mucosal thickening of bilateral sphenoid sinuses with air-fluid levels and frothy secretions. Mild bilateral inferior maxillary sinus mucosal thickening and scattered ethmoid air cell  opacification. Other: Large right small left mastoid effusions. ASPECTS [facility] Stroke Program Early CT Score) total score (0-10 with 10 being normal): 10.  IMPRESSION: 1. No evidence acute large vascular territory infarct or acute hemorrhage. ASPECTS is 10. 2. Moderate chronic microvascular ischemic and atrophy. 3. Mild-to-moderate paranasal sinus disease with air-fluid levels and frothy secretions in bilateral sphenoid sinuses. 4. Large right and small left mastoid effusions. Code stroke imaging results were communicated on 05/12/2021 at 1:34 pm to provider Bhagat via secure text paging. Electronically Signed   By: Feliberto HartsFrederick S Jones M.D.   On: 05/12/2021 13:35    Procedures Procedures   Medications Ordered in ED Medications  dextrose 5 % in lactated ringers infusion (has no administration in time range)    ED Course  I have reviewed the triage vital signs and the nursing notes.  Pertinent labs & imaging results that were available during my care of the patient were reviewed by me and considered in my medical decision making (see chart for details).    MDM Rules/Calculators/A&P                           Patient reportedly had a sugar of 22 at outside facility, improved to 180 with EMS but then dropped again to 72. In the ED received sugar bolus and sugar improved to 133. Initial speech deficit improved with administration of glucose, patient able to follow commands and speak. CT head was negative. However after initial improvement, patient again became lethargic and less responsive. Rectal temp at that time 94.7, patient placed on a bearhugger. Based on fluctuating level of consciousness, repeated episodes of unresponsiveness and hypoglycemia will put in for patient to be admitted.   Final Clinical Impression(s) / ED Diagnoses Final diagnoses:  None    Rx / DC Orders ED Discharge Orders     None        Ilene Quaemaio, Breanna Shorkey, MD 05/12/21 1453    Milagros Lollykstra, Richard S, MD 05/13/21 [phone]

## 2021-05-12 NOTE — ED Notes (Signed)
Pt BIB EMS as code stroke but was determined to be hypoglycemic and code stroke cancelled. Pt currently on IVF with dextrose, cardiac monitoring and labs pending. Pt was also found to be hypothermic via rectal temp and subsequently placed on bair hugger. Pt tracks with eyes at this time but is either unable or refuses to respond verbally. Does give minimal nods "yes" or "no" and those responses seem to be appropriate. Monitoring closely at this time.

## 2021-05-12 NOTE — ED Notes (Signed)
Report given to [facility] RN at 2337

## 2021-05-12 NOTE — Code Documentation (Signed)
Pt is a 80 yr old resident of [facility] with history of HTN, DM, TIA, dementia, COPD, obesity and PAD on Eliquis. She was last known to be well today at 1130. Staff found her unresponsive and hypoglycemic. EMS was called. Dextrose given in route to [facility]. Pt arrived at [facility] at 1317. She was stuporous at bridge. Her glucose was 77. One amp dextrose given, then pt taken for STAT imaging. CT [place] obtained which was negative for acute hemorrhage per Dr Iver Nestle. Pt began to be more alert at that time. She withdrew both arms from pain and stated "if you do that one more time I'm going to slap you". As exam rapidly improving after correcting hypoglycemia, Code Stroke was cancelled.

## 2021-05-12 NOTE — ED Triage Notes (Addendum)
To ED via [facility] for eval of Code Stroke. Pt LSW 1200 and is ambulatory at baseline. Stroke team and EDP met pt at the bridge. Noted to be non-verbal. CBG on arrival to NH 22. Glucose given by EMS. CBG on arrival to ED 72. EDP ordered 1 amp dextrose. Labs drawn and then transported to [place].

## 2021-05-13 ENCOUNTER — Observation Stay (HOSPITAL_COMMUNITY): Payer: Medicare (Managed Care)

## 2021-05-13 LAB — GLUCOSE, CAPILLARY
Glucose-Capillary: 160 mg/dL — ABNORMAL HIGH (ref 70–99)
Glucose-Capillary: 196 mg/dL — ABNORMAL HIGH (ref 70–99)
Glucose-Capillary: 205 mg/dL — ABNORMAL HIGH (ref 70–99)
Glucose-Capillary: 216 mg/dL — ABNORMAL HIGH (ref 70–99)
Glucose-Capillary: 195 mg/dL — ABNORMAL HIGH (ref 70–99)
Glucose-Capillary: 156 mg/dL — ABNORMAL HIGH (ref 70–99)
Glucose-Capillary: 170 mg/dL — ABNORMAL HIGH (ref 70–99)
Glucose-Capillary: 281 mg/dL — ABNORMAL HIGH (ref 70–99)
Glucose-Capillary: 225 mg/dL — ABNORMAL HIGH (ref 70–99)
Glucose-Capillary: 184 mg/dL — ABNORMAL HIGH (ref 70–99)

## 2021-05-13 LAB — BASIC METABOLIC PANEL
Anion gap: 6 (ref 5–15)
BUN: 24 mg/dL — ABNORMAL HIGH (ref 8–23)
CO2: 29 mmol/L (ref 22–32)
Calcium: 8.5 mg/dL — ABNORMAL LOW (ref 8.9–10.3)
Chloride: 105 mmol/L (ref 98–111)
Creatinine, Ser: 1.4 mg/dL — ABNORMAL HIGH (ref 0.44–1.00)
GFR, Estimated: 39 mL/min — ABNORMAL LOW (ref >60)
Glucose, Bld: 201 mg/dL — ABNORMAL HIGH (ref 70–99)
Potassium: 4.3 mmol/L (ref 3.5–5.1)
Sodium: 140 mmol/L (ref 135–145)

## 2021-05-13 LAB — CBC
HCT: 30.2 % — ABNORMAL LOW (ref 36.0–46.0)
Hemoglobin: 9.7 g/dL — ABNORMAL LOW (ref 12.0–15.0)
MCH: 29.2 pg (ref 26.0–34.0)
MCHC: 32.1 g/dL (ref 30.0–36.0)
MCV: 91 fL (ref 80.0–100.0)
Platelets: 176 10*3/uL (ref 150–400)
RBC: 3.32 MIL/uL — ABNORMAL LOW (ref 3.87–5.11)
RDW: 14.7 % (ref 11.5–15.5)
WBC: 6.1 10*3/uL (ref 4.0–10.5)
nRBC: 0 % (ref 0.0–0.2)

## 2021-05-13 LAB — CORTISOL: Cortisol, Plasma: 12.9 ug/dL

## 2021-05-13 LAB — HEMOGLOBIN A1C
Hgb A1c MFr Bld: 8.1 % — ABNORMAL HIGH (ref 4.8–5.6)
Mean Plasma Glucose: 185.77 mg/dL

## 2021-05-13 MED ORDER — POLYETHYLENE GLYCOL 3350 17 G PO PACK
17.0000 g | PACK | Freq: Every day | ORAL | Status: DC
  Filled 2021-05-13 (×2): qty 1

## 2021-05-13 MED ORDER — AMLODIPINE BESYLATE 5 MG PO TABS
5.0000 mg | ORAL_TABLET | Freq: Every day | ORAL | Status: DC
  Administered 2021-05-13 – 2021-05-14 (×2): 5 mg via ORAL
  Filled 2021-05-13 (×2): qty 1

## 2021-05-13 MED ORDER — ENOXAPARIN SODIUM 40 MG/0.4ML IJ SOSY
40.0000 mg | PREFILLED_SYRINGE | INTRAMUSCULAR | Status: DC
  Administered 2021-05-13: 40 mg via SUBCUTANEOUS
  Filled 2021-05-13: qty 0.4

## 2021-05-13 MED ORDER — INSULIN GLARGINE-YFGN 100 UNIT/ML ~~LOC~~ SOLN
20.0000 [IU] | Freq: Every day | SUBCUTANEOUS | Status: DC
  Administered 2021-05-13: 20 [IU] via SUBCUTANEOUS
  Filled 2021-05-13 (×2): qty 0.2

## 2021-05-13 NOTE — Progress Notes (Signed)
  Ms. Winzer is 80 year old female with past medical history of diabetes, COPD with chronic respiratory failure, dementia who presents to the ED from a skilled nursing facility found to be hypoglycemic.   Subjective:  Patient stated she was having some abdominal pain and was feeling nauseous. When asked to sit up for the physical exam she stated that would make her nauseous. When she sat up she said she did become nauseous. She did not like having SCDs on her legs.    Overnight:No acute event overnight.   Objective:  Vital signs in last 24 hours: Vitals:   05/13/21 0108 05/13/21 0505 05/13/21 0735 05/13/21 1144  BP: (!) 157/70 (!) 169/74 (!) 155/55 (!) 186/72  Pulse: 70 78 79 79  Resp: 20 20 16    Temp: 98.8 F (37.1 C) 98.1 F (36.7 C) 98 F (36.7 C) 98.4 F (36.9 C)  TempSrc: Oral Oral Oral Oral  SpO2: 90% 93% (!) 89% 92%  Weight:      Height:       Physical Exam General: Well appearing, NAD, obese elderly female CV: normal rate, normal rhythm, 2+ pulses Resp:Normal lung sounds, no wheeze, rhonchi, rales. GI: No TTP, but abdomen felt hard in LLQ upon palpation   Assessment/Plan:  Principal Problem:   Acute metabolic encephalopathy Active Problems:   Hypoglycemia associated with diabetes (HCC)  Acute metabolic encephalopathy secondary to hypoglycemia Patient was altered with hypoglycemia. This was her second episode. I spoke with her PCP at [facility] who stated he recently started taking care of her and he adjusted her dose after last episode. He wasn't sure of exact cause as he didn't know if patient was eating adequately or not. He recommended discharge only on 40 units of Glargine and no short acting insulin. He stated he would follow her very closely outpatient and would want her hyperglycemic in the meantime to avoid hypoglycemia. AM cortisol negative for adrenal insuffiencey.   Plan: -Continue SSI insulin -CBG q4hrs -Will add long acting as needed  Hypertension Patient  currently hypertensive with BP of 186/72. Will start adding back her home meds slowly. Will start with Norvasc and then add metoprolol.   Plan: -Norvasc 5 mg -Continue to monitor  AKI vs CKD  Patient either has AKI or has progressive CKD as her creatinine was higher 2 weeks ago and was around 1 last year. Creatinine this morning was 1.40. Will recommend follow after discharge.   Plan: -Continue to monitor  -Encourage hydration  DVT prophx: Lovenox 40 mg d/c SSDs Diet: Cardiac Bowel: Miralax daily Code: Full    Prior to Admission Living Arrangement: Anticipated Discharge Location: Barriers to Discharge: Dispo: Anticipated discharge in approximately 1 day(s).   [facility], MD 05/13/2021, 5:06 PM Pager: [phone] After 5pm on weekdays and 1pm on weekends: Call on call pager

## 2021-05-13 NOTE — Progress Notes (Signed)
Marylu Lund, SW from [facility] called to find out plan for pt on discharge and if pt was going to be able to go to daughter's home and resume [facility].  (Pt was at [facility] and was to be d/c'd home to daughter yesterday 8/23---but pt was sent to hospital)    She was informed that it is not known what the plan will be at this time.  Information taken and she ask for call from SW/CM here once MD decides plan for pt. Aniceto Boss, at [facility] [phone]

## 2021-05-13 NOTE — Progress Notes (Addendum)
Spoke with daughter Rosey Bath on the phone, she is planning to come up to hospital to see pt.   She states she found out last evening at 7pm that here mom had been sent to the hospital around 12 noon yesterday.   She states her mom was suppose to be coming home from SNF [facility]) yesterday with [facility]  transporting her.   Will have the doctor talk with daughter when she arrives for updates and plan for pt.

## 2021-05-14 ENCOUNTER — Other Ambulatory Visit (HOSPITAL_COMMUNITY): Payer: Self-pay

## 2021-05-14 DIAGNOSIS — G9341 Metabolic encephalopathy: Secondary | ICD-10-CM

## 2021-05-14 DIAGNOSIS — N390 Urinary tract infection, site not specified: Secondary | ICD-10-CM | POA: Diagnosis present

## 2021-05-14 LAB — GLUCOSE, CAPILLARY
Glucose-Capillary: 174 mg/dL — ABNORMAL HIGH (ref 70–99)
Glucose-Capillary: 162 mg/dL — ABNORMAL HIGH (ref 70–99)
Glucose-Capillary: 161 mg/dL — ABNORMAL HIGH (ref 70–99)
Glucose-Capillary: 120 mg/dL — ABNORMAL HIGH (ref 70–99)

## 2021-05-14 LAB — BASIC METABOLIC PANEL
Anion gap: 6 (ref 5–15)
BUN: 21 mg/dL (ref 8–23)
CO2: 28 mmol/L (ref 22–32)
Calcium: 8.7 mg/dL — ABNORMAL LOW (ref 8.9–10.3)
Chloride: 105 mmol/L (ref 98–111)
Creatinine, Ser: 1.5 mg/dL — ABNORMAL HIGH (ref 0.44–1.00)
GFR, Estimated: 36 mL/min — ABNORMAL LOW (ref >60)
Glucose, Bld: 183 mg/dL — ABNORMAL HIGH (ref 70–99)
Potassium: 4.4 mmol/L (ref 3.5–5.1)
Sodium: 139 mmol/L (ref 135–145)

## 2021-05-14 MED ORDER — ROSUVASTATIN CALCIUM 5 MG PO TABS
10.0000 mg | ORAL_TABLET | Freq: Every day | ORAL | Status: DC
  Administered 2021-05-14: 10 mg via ORAL
  Filled 2021-05-14: qty 2

## 2021-05-14 MED ORDER — LANTUS 100 UNIT/ML ~~LOC~~ SOLN: 20.0000 [IU] | Freq: Every morning | SUBCUTANEOUS | 11 refills | Status: AC

## 2021-05-14 MED ORDER — ONDANSETRON HCL 4 MG/2ML IJ SOLN
4.0000 mg | Freq: Four times a day (QID) | INTRAMUSCULAR | Status: DC | PRN
  Administered 2021-05-14: 4 mg via INTRAVENOUS
  Filled 2021-05-14: qty 2

## 2021-05-14 MED ORDER — CIPROFLOXACIN HCL 500 MG PO TABS: 250.0000 mg | ORAL_TABLET | Freq: Two times a day (BID) | ORAL | 0 refills | Status: AC

## 2021-05-14 MED ORDER — METOPROLOL SUCCINATE ER 100 MG PO TB24
100.0000 mg | ORAL_TABLET | Freq: Every day | ORAL | Status: DC
  Administered 2021-05-14: 100 mg via ORAL
  Filled 2021-05-14: qty 1

## 2021-05-14 MED ORDER — INSULIN ASPART 100 UNIT/ML IJ SOLN
0.0000 [IU] | Freq: Three times a day (TID) | INTRAMUSCULAR | Status: DC
  Administered 2021-05-14: 3 [IU] via SUBCUTANEOUS

## 2021-05-14 MED ORDER — CIPROFLOXACIN HCL 250 MG PO TABS
250.0000 mg | ORAL_TABLET | Freq: Two times a day (BID) | ORAL | 0 refills | Status: AC
  Filled 2021-05-14: qty 14, 7d supply, fill #0

## 2021-05-14 MED ORDER — APIXABAN 5 MG PO TABS
5.0000 mg | ORAL_TABLET | Freq: Two times a day (BID) | ORAL | Status: DC
  Administered 2021-05-14: 5 mg via ORAL
  Filled 2021-05-14: qty 1

## 2021-05-14 NOTE — Plan of Care (Signed)
Spoke with TOC regarding cipro prescription. TOC spoke with [facility] who will send the cipro prescription out in the morning.   Elige Radon, MD Internal Medicine Resident PGY-3 [facility] Pager: [phone] 05/14/2021 3:53 PM

## 2021-05-14 NOTE — Progress Notes (Signed)
PT Cancellation Note  Patient Details Name: Gabriella [name] MRN: [id number] DOB: 07-05-1941   Cancelled Treatment:    Reason Eval/Treat Not Completed: Fatigue/lethargy limiting ability to participate; pt worked with OT and c/o fatigue and abdominal pain (RN aware). Will follow-up for PT Evaluation as schedule permits later this morning.  Ina Homes, PT, DPT Acute Rehabilitation Services  Pager [phone] Office [phone]  Malachy Chamber 05/14/2021, 8:52 AM

## 2021-05-14 NOTE — TOC Transition Note (Addendum)
Transition of Care [facility]) - CM/SW Discharge Note   Patient Details  Name: Nabilah Davoli MRN: [id number] Date of Birth: 10-27-1940  [facility] CM/SW Contact:  Leone Haven, RN Phone Number: 05/14/2021, 2:50 PM   Clinical Narrative:    NCM notified by CSW that daughter wants to take patient home .  Patient is a patient of [facility], she will get therapy at [facility].  NCM contacted [facility] and left vm for Marylu Lund the CSW that patient is for dc today and will need HHPT , HHOT.  Daughter will be here to pick up patient to take her home.  She does not want her to back to [facility].  NCM spoke with Leonor Liv RN and gave her the information , she was very appreciative of the information regarding this patient. She will receive therapy at [facility].   Final next level of care: Home w Home Health Services Barriers to Discharge: No Barriers Identified   Patient Goals and CMS Choice Patient states their goals for this hospitalization and ongoing recovery are:: return home      Discharge Placement                       Discharge Plan and Services                  DME Agency: NA                  Social Determinants of Health (SDOH) Interventions     Readmission Risk Interventions Readmission Risk Prevention Plan 12/01/2018  Transportation Screening Complete  Medication Review [employer]) Patient Refused  PCP or Specialist appointment within 3-5 days of discharge Complete  HRI or Home Care Consult Not Complete  HRI or Home Care Consult Pt Refusal Comments Patient will go to SNF.  SW Recovery Care/Counseling Consult Patient refused  Palliative Care Screening Not Applicable  Skilled Nursing Facility Complete  Some recent data might be hidden

## 2021-05-14 NOTE — Discharge Summary (Addendum)
Name: Gabriella [name] MRN: [id number] DOB: [birth year] 80 y.o. PCP: Gabriella Oaks, NP (Inactive)  Date of Admission: 05/12/2021  1:17 PM Date of Discharge: 05/14/21 Attending Physician: Gabriella Rung, DO  Discharge Diagnosis: 1. Acute Metabolic Encephalopathy secondary to hypoglycemia 2. Dementia 3. Type 2 diabetes with hypoglycemia 4. Chronic Kidney Disease Stage 3b 5. Uncomplicated Pseudomonas urinary tract infection  Discharge Medications: Allergies as of 05/14/2021       Reactions   Other Diarrhea, Nausea And Vomiting, Anxiety, Other (See Comments)   Unknown pain medication: caused sweating, nervousness, and "Some new pain med given at [facility], but I don't know what it's called." Patient states "it knocked me out and I got sweaty and they gave me benadryl."        Medication List     STOP taking these medications    acetaminophen 325 MG tablet Commonly known as: TYLENOL   cephALEXin 500 MG capsule Commonly known as: KEFLEX   HumaLOG 100 UNIT/ML injection Generic drug: insulin lispro   ondansetron 4 MG tablet Commonly known as: ZOFRAN   traMADol 50 MG tablet Commonly known as: ULTRAM       TAKE these medications    albuterol 108 (90 Base) MCG/ACT inhaler Commonly known as: VENTOLIN HFA Inhale 2-4 puffs into the lungs every 4 (four) hours as needed for up to 15 days for wheezing or shortness of breath. What changed:  when to take this additional instructions   amLODipine 5 MG tablet Commonly known as: NORVASC Take 5 mg by mouth daily.   Breo Ellipta 200-25 MCG/INH Aepb Generic drug: fluticasone furoate-vilanterol Inhale 1 puff into the lungs in the morning.   ciprofloxacin 500 MG tablet Commonly known as: Cipro Take 0.5 tablets (250 mg total) by mouth 2 (two) times daily for 7 days.   ciprofloxacin 250 MG tablet Commonly known as: CIPRO Take 1 tablet (250 mg total) by mouth 2 (two) times daily for 7 days.   cloNIDine 0.2 MG  tablet Commonly known as: CATAPRES Take 0.2 mg by mouth 2 (two) times daily.   Desitin 40 % Pste Generic drug: Zinc Oxide Apply 1 application topically See admin instructions. Apply topically to coccyx and groin area(s) with every shift change/3 times a day   Eliquis 5 MG Tabs tablet Generic drug: apixaban Take 5 mg by mouth in the morning and at bedtime.   Incruse Ellipta 62.5 MCG/INH Aepb Generic drug: umeclidinium bromide Inhale 1 puff into the lungs in the morning.   Lantus 100 UNIT/ML injection Generic drug: insulin glargine Inject 0.2 mLs (20 Units total) into the skin in the morning. What changed: how much to take   Lovaza 1 g capsule Generic drug: omega-3 acid ethyl esters Take 2 g by mouth 2 (two) times daily.   metoprolol succinate 100 MG 24 hr tablet Commonly known as: TOPROL-XL Take 100 mg by mouth daily. Take with or immediately following a meal.   rosuvastatin 10 MG tablet Commonly known as: CRESTOR Take 10 mg by mouth daily.   Vitamin D3 1.25 MG (50000 UT) Caps Take 50,000 Units by mouth every 30 (thirty) days.        Disposition and follow-up:   [name] was discharged from [facility] in Stable condition.  At the hospital follow up visit please address:  Type 2 diabetes complicated by hypoglycemia -Discharge medication changes: Semglee decreased to 20 units daily  Uncomplicated Pseudomonas Urinary Tract Infection  -START ciprofloxicin 250mg  twice daily  for 7 days (8/25-8/31)  Labs / imaging needed at time of follow-up: none  Pending labs/ test needing follow-up: Urine culture sensitivities, blood cultures  Follow-up Appointments:  Follow-up Information     Gabriella Oaks, NP Follow up.   Specialty: Nurse Practitioner Why: Please follow up in a week. Contact information: [address] [place] [address] [phone]               Follow up with [facility] Course:  Gabriella [name] is a  chronically ill 80 year old female with diabetes, hypertension, dementia, and history of strokes who has been residing at [facility] prior to admission.  She had presented to the ED 1 week prior with hypoglycemia which improved with a dextrose infusion. Due to concern of a UTI at that time, she received a 7 day course of keflex.  She returned to the emergency department on 8/23 after being found unresponsive at [facility].  EMS reported initial glucose of 22 for which she received an amp of D50.  Glucose initially corrected however, after continuing to have hypoglycemia and altered mentation, she was admitted for ongoing monitoring.  Of note, her insulin glargine doses had been varying greatly since her admission to [facility] for COVID-19 back in July.  At that time, she had been discharged on Lantus 60 units daily, presumably to compensate for hyperglycemia in the setting of steroid use.  It appears that this dose was decreased again to 24 units earlier this month however she has been receiving 45 units of Lantus most recently based on review of the [facility].  She initially required a D10 infusion to maintain adequate glucose levels.  Once hypoglycemia was corrected, her mental status improved.  Dextrose infusion was discontinued on hospital day #1 and she became hyperglycemic so she was restarted on 20 units of Semglee.  She did not experience any further episodes of hypoglycemia during the remainder of her admission.  She is also been noted to have mild hypothermia on admission to 94.99F.  Chest x-ray was unremarkable.  Out of concern for sepsis, urine and blood cultures were obtained.  Urine cultures grew greater than 100,000 colonies of Pseudomonas.  Blood cultures remained negative at time of discharge.  Overall, I suspect that the presenting hypoglycemia was secondary to a combination of the higher dose of Lantus as well as the Pseudomonas UTI.    Discharge Subjective:  Hyperglycemic  overnight, insulin glargine started.   PT and OT attempted to work with her this  morning. PT was unable to get an adequate assessment due to pt cooperation issues.  Occupational Therapy saw her as well and recommended ongoing rehabilitation at a skilled facility.  I spoke with [facility] Dorothe Pea at [facility] who was in [facility] to [facility] for ongoing physical therapy however her daughter requests discharge home with home health with the daughter to provide ongoing care. She was instructed to follow up with [facility] in the next week for hospital follow up.  Discharge Exam:   BP (!) 155/79   Pulse 80   Temp 98.7 F (37.1 C) (Oral)   Resp 16   Ht 5\' 3"  (1.6 m)   Wt 98 kg   SpO2 (!) 89%   BMI 38.28 kg/m  Discharge exam:  General: chronically ill appearing, laying in bed, obese Cardiac: RRR, no LE edema Pulm: breathing comfortably on room air GI: Abdomen is somewhat firm.  Tenderness to palpation in the left lower  quadrant Neurologic: Alert and oriented to person, place and situation.  Initial reluctance to answer questions that required further prompting.  Pertinent Labs, Studies, and Procedures:  CBC Latest Ref Rng & Units 05/13/2021 05/12/2021 05/12/2021  WBC 4.0 - 10.5 K/uL 6.1 6.4 -  Hemoglobin 12.0 - 15.0 g/dL 3.6(O) 2.9(U) 7.6(L)  Hematocrit 36.0 - 46.0 % 30.2(L) 31.1(L) 27.0(L)  Platelets 150 - 400 K/uL 176 171 -    CMP Latest Ref Rng & Units 05/14/2021 05/13/2021 05/12/2021  Glucose 70 - 99 mg/dL 465(K) 354(S) 76  BUN 8 - 23 mg/dL 21 56(C) 12(X)  Creatinine 0.44 - 1.00 mg/dL 5.17(G) 0.17(C) 9.44(H)  Sodium 135 - 145 mmol/L 139 140 141  Potassium 3.5 - 5.1 mmol/L 4.4 4.3 4.1  Chloride 98 - 111 mmol/L 105 105 109  CO2 22 - 32 mmol/L 28 29 27   Calcium 8.9 - 10.3 mg/dL ) 6.7(R) 8.3(L)  Total Protein 6.5 - 8.1 g/dL - - 5.4(L)  Total Bilirubin 0.3 - 1.2 mg/dL - - 9.1(M)  Alkaline Phos 38 - 126 U/L - - 49  AST 15 - 41 U/L - - 38  ALT 0 - 44 U/L - - 51(H)    A1c-  8.1 Random cortisol- 12.9  DG CHEST PORT 1 VIEW Result Date: 05/13/2021 CLINICAL DATA:  Hypothermia, hypoglycemia EXAM: PORTABLE CHEST 1 IMPRESSION: Enlargement of cardiac silhouette with minimal bibasilar atelectasis. Electronically Signed   By: 05/15/2021 M.D.   On: 05/13/2021 14:02   CT HEAD CODE STROKE WO CONTRAST Result Date: 05/12/2021 CLINICAL DATA:  Code stroke. Neuro deficit, acute, stroke suspected. Weakness. EXAM: CT HEAD WITHOUT CONTRAST IMPRESSION: 1. No evidence acute large vascular territory infarct or acute hemorrhage. ASPECTS is 10. 2. Moderate chronic microvascular ischemic and atrophy. 3. Mild-to-moderate paranasal sinus disease with air-fluid levels and frothy secretions in bilateral sphenoid sinuses. 4. Large right and small left mastoid effusions. Code stroke imaging results were communicated on 05/12/2021 at 1:34 pm to provider Bhagat via secure text paging. Electronically Signed   By: 05/14/2021 M.D.   On: 05/12/2021 13:35      Signed: 05/14/2021, MD Internal Medicine Resident PGY-3 [facility] Pager: [phone] 05/14/2021 3:19 PM

## 2021-05-14 NOTE — Social Work (Signed)
CSW spoke with Cheyenne Adas to confirm pt DC back to the facility. Rasheema from [facility] stated pt was DC from their facility the day she admitted at the hospital. CSW asked about bed availability if [facility] would cover pt to return. A bed is available and pt would need auth before returning.   CSW contacted pt daughter to provide update. Pt daughter stated she would like to tae pt home to care for her, she has changed her work schedule to be able to help her mother during the day and to hire a sitter at night. CSW alerted pt daughter that pt can be DC today and pt daughter is willing to pick pt up.

## 2021-05-14 NOTE — Progress Notes (Signed)
Patient out of bed to get dressed to go home. One assist with walker with ADLs. Daughter is here to take patient home.

## 2021-05-14 NOTE — Therapy (Signed)
Occupational Therapy Evaluation Patient Details Name: Gabriella [name] MRN: [id number] DOB: 1941-06-21 Today's Date: 05/14/2021    History of Present Illness Pt is a 80 y.o. female admitted from [facility] (was apparently about to d/c to daughter's home) on 05/12/21 with AMS, decreased responsiveness; workup for severe hypoglycemia, hypothermia, questionable UTI. Head CT negative for acute injury. PMH includes DM, neuropathy, CKD 3, COPD (on 3L O2 baseline), strokes, PAD, fibromyalgia, chronic pain syndrome, dementia.   Clinical Impression   Pt was admitted as above and presenting with problems as listed below. Upon OT assessment, pt c/o nausea and upper abdominal pain on left. RN was made aware and assessed vitals, gave meds and in room during assessment. Pt had declined OOB activity so limited bed level eval was performed, transfers to be assessed as able. Pt is poor historian but was aware of person, place. No family present for PLOF or to assist in determining d/c plans, but was admitted from SNF on day that she was to return home w/ daughter. Pt prefers d/c home with daughter if able. Pt was educated in role of OT and participated in bed level ADL retraining session. Will follow acutely.     Follow Up Recommendations  Other (comment);Supervision/Assistance - 24 hour;SNF (SNF vs home with family & HHOT if family is able to provide necessary level of assist.)    Equipment Recommendations  Other (comment) (Cont to assess)    Recommendations for Other Services PT consult     Precautions / Restrictions Precautions Precautions: Fall Precaution Comments: h/o Dementia      Mobility Bed Mobility Overal bed mobility: Needs Assistance Bed Mobility: Supine to Sit     Supine to sit: Mod assist;HOB elevated (Pt was mod assist to sit up in bed to change gown this date, HOB elevated.)    Transfers Overall transfer level: Needs assistance     General transfer comment: Pt declined OOB  activity this date secondary to nausea. RN is aware and was in room to assess vitals, gave medication. Will assess as able.    Balance       ADL either performed or assessed with clinical judgement   ADL Overall ADL's : Needs assistance/impaired Eating/Feeding: Set up;Bed level   Grooming: Wash/dry hands;Wash/dry face;Min guard;Set up;Bed level   Upper Body Bathing: Minimal assistance;Bed level;Cueing for sequencing   Lower Body Bathing: Maximal assistance;Total assistance;Bed level   Upper Body Dressing : Moderate assistance;Bed level (To change gown)   Lower Body Dressing: Maximal assistance;Total assistance;Bed level (Pt states "I can't do it" when asked to don slipper socks at bed level. She was able to flex at her knees/heel slide about 40* but not further than that, stating "It hurts" to move.)   Toilet Transfer: Total assistance;+2 for physical assistance;+2 for safety/equipment (Bed level at eval secondary to nausea and pain and declining any OOB activity.)   Toileting- Clothing Manipulation and Hygiene: Total assistance;Maximal assistance;+2 for physical assistance;Bed level       Functional mobility during ADLs:  (Pt declining OOB activity despite max encouragement to participate. Will assess at later date as able.) General ADL Comments: Pt with limited participation in OT assessment this date secondary to c/o nausea and left upper abdominal pain. RN aware and in room. RN assessed vitals, BG and gave pt medication to assist. No family present to assist with baseline level and plan for d/c.                  Pertinent  Vitals/Pain Pain Assessment: Faces Faces Pain Scale: Hurts little more Pain Location: upper abdomen (left side) Pain Descriptors / Indicators: Discomfort;Grimacing Pain Intervention(s): Limited activity within patient's tolerance;Monitored during session;RN gave pain meds during session     Hand Dominance Right   Extremity/Trunk Assessment Upper  Extremity Assessment Upper Extremity Assessment: Generalized weakness;Overall [facility] for tasks assessed   Lower Extremity Assessment Lower Extremity Assessment: Defer to PT evaluation       Communication Communication Communication: No difficulties   Cognition Arousal/Alertness: Awake/alert Behavior During Therapy: Flat affect Overall Cognitive Status: History of cognitive impairments - at baseline (Pt aware of person, place, not date, time or situation. Answered that she was left handed and later stated " I'm right handed, you said you were left handed".)     General Comments: No family present to assist with PLOF   General Comments  Pt appears weak, h/o dementia at baseline, difficulty w/ PLOF and no family present. Nausea impacting ability to fully participate in OT assessment for OOB activity.            Home Living Family/patient expects to be discharged to:: Unsure     Additional Comments: Pt presented from SNF but was due to d/c home with her daughter. No family present during assessment to discuss plan.      Prior Functioning/Environment Level of Independence: Needs assistance  Gait / Transfers Assistance Needed: Pt declined OOB activity secondary to nausea and upper abdomin pain. RN aware & gave anti nausea medication. ADL's / Homemaking Assistance Needed: Pt reports using RW prior to this admission   Comments: Pt reporting fatigue/nausea, RN aware and gave medication. Pt polietly declined OOB activity, will assess transfers as pt able.        OT Problem List: Decreased strength;Decreased activity tolerance;Impaired balance (sitting and/or standing);Decreased cognition;Decreased safety awareness;Decreased knowledge of use of DME or AE;Obesity;Pain      OT Treatment/Interventions:   OT Goals(Current goals can be found in the care plan section) Acute Rehab OT Goals Patient Stated Goal: Go home with daughter OT Goal Formulation: Patient unable to participate in goal  setting Time For Goal Achievement: 05/28/21 Potential to Achieve Goals: Fair ADL Goals Pt Will Perform Grooming: with set-up;sitting Pt Will Perform Lower Body Bathing: with min assist;sitting/lateral leans;sit to/from stand;with caregiver independent in assisting Pt Will Perform Lower Body Dressing: with min assist;with caregiver independent in assisting;sitting/lateral leans;sit to/from stand Pt Will Transfer to Toilet: with supervision;ambulating;bedside commode Pt Will Perform Toileting - Clothing Manipulation and hygiene: with supervision;with caregiver independent in assisting;sitting/lateral leans;sit to/from stand Additional ADL Goal #1: Pt will be Mod I in and OOB bed for ADL's  OT Frequency:     Barriers to D/C: Other (comment) (Pt is poor historian, no family present to discuss PLOF and plan for d/c.)  Unknown if pt would have necessary level of support for d/c home. Currently recommending SNF unless family can provide necessary support. Cont to assess.          AM-PAC OT "6 Clicks" Daily Activity     Outcome Measure                 End of Session Nurse Communication: Other (comment) (c/o nausea, not feeling well and declining OOB activity.)  Activity Tolerance: Other (comment) (Treatment limited by pt c/o nausea and declining OOB activity) Patient left: in bed;with call bell/phone within reach;with bed alarm set;with nursing/sitter in room (RN in room giving medication)  OT Visit Diagnosis: Muscle weakness (generalized) (M62.81);Other  symptoms and signs involving cognitive function;Pain;Unsteadiness on feet (R26.81) Pain - part of body:  (Upper abdomen)                Time: 0045-9977 OT Time Calculation (min): 30 min Charges:  OT General Charges $OT Visit: 1 Visit OT Evaluation $OT Eval Moderate Complexity: 1 Mod OT Treatments $Self Care/Home Management : 8-22 mins  Janel Beane Beth Dixon, OTR/L 05/14/2021, 9:22 AM

## 2021-05-14 NOTE — Evaluation (Signed)
Physical Therapy Evaluation Patient Details Name: Gabriella [name] MRN: [id number] DOB: 1940/12/31 Today's Date: 05/14/2021   History of Present Illness  Pt is a 80 y.o. female admitted from [facility] SNF (was apparently about to d/c to daughter's home) on 05/12/21 with AMS, decreased responsiveness; workup for severe hypoglycemia, hypothermia, questionable UTI. Head CT negative for acute injury. PMH includes DM, neuropathy, CKD 3, COPD (on 3L O2 baseline), strokes, PAD, fibromyalgia, chronic pain syndrome, dementia.   Clinical Impression  Pt presents with an overall decrease in functional mobility secondary to above. Per chart, pt admitted from SNF on the same day she was supposed to d/c to daughter's home with [facility] services. Pt with h/o dementia and poor historian, reports ambulatory with RW, but unclear true PLOF. Today, pt required minA for bed mobility, demonstrates functional BUE/BLE strength at bed-level, but adamantly declining EOB or OOB activity despite max encouragement. Attempted 2x phone call to daughter to discuss session and d/c planning, no answer. Pt would benefit from continued acute PT services to maximize functional mobility and independence. If pt to d/c home with daughter, suspect she will need assist for mobility and ADL tasks.    Follow Up Recommendations SNF;Supervision for mobility/OOB (versus HHPT/PACE pending mobility progression and support available)    Equipment Recommendations  Other (TBD)    Recommendations for Other Services       Precautions / Restrictions Precautions Precautions: Fall Restrictions Weight Bearing Restrictions: No      Mobility  Bed Mobility Overal bed mobility: Needs Assistance Bed Mobility: Rolling           General bed mobility comments: pt able to scoot self up with BUE rail support and BLE heels to bridge hips up with minA for scooting trunk via bed pad; pt adamantly declining sitting EOB despite max encourage/education; bed left  in modified chair position to encourage upright posture    Transfers                    Ambulation/Gait                Stairs            Wheelchair Mobility    Modified Rankin (Stroke Patients Only)       Balance                                             Pertinent Vitals/Pain Pain Assessment: Faces Faces Pain Scale: Hurts a little bit Pain Location: R-side abdomen/nausea Pain Descriptors / Indicators: Discomfort Pain Intervention(s): Monitored during session;Limited activity within patient's tolerance;Repositioned    Home Living Family/patient expects to be discharged to:: Private residence Living Arrangements: Children Available Help at Discharge: Family;Available PRN/intermittently             Additional Comments: Pt from [facility] with plans to d/c home with daughter from SNF on day of admission. Pt reports daughter works at [employer]. Pt is member of [employer]    Prior Function Level of Independence: Needs assistance   Gait / Transfers Assistance Needed: Pt reports using RW prior to this admission  ADL's / Homemaking Assistance Needed: Suspect pt required assist from staff at SNF for ADLs  Comments: Pt poor historian, unsure if reliable information. Attempted 2x phone call to daughter Aggie Cosier), but no answer and voicemail box full  Hand Dominance        Extremity/Trunk Assessment   Upper Extremity Assessment Upper Extremity Assessment: Overall WFL for tasks assessed;Difficult to assess due to impaired cognition (pt with full shoulder ROM and strength to pull herself up in flat bed using top bedrail)    Lower Extremity Assessment Lower Extremity Assessment: Generalized weakness;Difficult to assess due to impaired cognition (suspect at least 3/5 strength; functional strength observed pt able to perform BLE heel slide to scoot self up in bed, abd/add, partial SLR, cross/uncross legs)        Communication   Communication: No difficulties  Cognition Arousal/Alertness: Awake/alert Behavior During Therapy: Flat affect Overall Cognitive Status: History of cognitive impairments - at baseline                                 General Comments: Per chart, h/o dementia. Pt initially acting lethargic with minimal responses to questions; following simple commands appropriately, but unable to reason with pt on need to get OOB. Return to room after getting blanket and pt wide awake watching tv. RN reports daughter stated on phone, "In 300 years she won't work with PT/OT"      General Comments General comments (skin integrity, edema, etc.): Pt appears weak/fatigued with h/o dementia. Attempted phone call to daughter to discuss d/c planning, but no answer; pt unaware of d/c plan    Exercises     Assessment/Plan    PT Assessment Patient needs continued PT services  PT Problem List Decreased strength;Decreased activity tolerance;Decreased balance;Decreased mobility;Decreased cognition;Obesity;Decreased knowledge of use of DME       PT Treatment Interventions DME instruction;Gait training;Functional mobility training;Therapeutic activities;Therapeutic exercise;Balance training;Patient/family education;Wheelchair mobility training;Cognitive remediation    PT Goals (Current goals can be found in the Care Plan section)  Acute Rehab PT Goals Patient Stated Goal: "I don't want to get up" PT Goal Formulation: With patient Time For Goal Achievement: 05/28/21 Potential to Achieve Goals: Fair    Frequency Min 3X/week   Barriers to discharge        Co-evaluation               AM-PAC PT "6 Clicks" Mobility  Outcome Measure Help needed turning from your back to your side while in a flat bed without using bedrails?: A Little Help needed moving from lying on your back to sitting on the side of a flat bed without using bedrails?: A Lot Help needed moving to and from a bed  to a chair (including a wheelchair)?: A Lot Help needed standing up from a chair using your arms (e.g., wheelchair or bedside chair)?: A Lot Help needed to walk in hospital room?: A Lot Help needed climbing 3-5 steps with a railing? : A Lot 6 Click Score: 13    End of Session   Activity Tolerance: Patient limited by fatigue Patient left: in bed;with call bell/phone within reach;with bed alarm set Nurse Communication: Mobility status PT Visit Diagnosis: Other abnormalities of gait and mobility (R26.89);Muscle weakness (generalized) (M62.81)    Time: 1224-8250 PT Time Calculation (min) (ACUTE ONLY): 16 min   Charges:   PT Evaluation $PT Eval Moderate Complexity: 1 Mod        Ina Homes, PT, DPT Acute Rehabilitation Services  Pager [phone] Office [phone]  Malachy Chamber 05/14/2021, 12:01 PM

## 2021-05-15 LAB — URINE CULTURE: Culture: >=100000 — AB

## 2021-05-18 LAB — CULTURE, BLOOD (ROUTINE X 2)
Culture: NO GROWTH
Culture: NO GROWTH
Special Requests: ADEQUATE
Special Requests: ADEQUATE

## 2021-09-10 ENCOUNTER — Ambulatory Visit
Admission: RE | Admit: 2021-09-10 | Discharge: 2021-09-10 | Disposition: A | Discharge status: Home / Self Care | Payer: Medicare (Managed Care) | Source: Ambulatory Visit | Attending: Hospitalist | Admitting: Hospitalist

## 2021-09-10 ENCOUNTER — Other Ambulatory Visit: Payer: Self-pay | Admitting: Hospitalist

## 2021-09-10 DIAGNOSIS — J449 Chronic obstructive pulmonary disease, unspecified: Secondary | ICD-10-CM

## 2024-01-19 DEATH — deceased
